# Patient Record
Sex: Female | Born: 1935 | Hispanic: No | Smoking: Former smoker
Health system: Southern US, Community
[De-identification: ages and names within clinical notes are randomized; demographics above are authoritative.]

## PROBLEM LIST (undated history)

## (undated) DIAGNOSIS — A809 Acute poliomyelitis, unspecified: Secondary | ICD-10-CM

## (undated) DIAGNOSIS — R011 Cardiac murmur, unspecified: Secondary | ICD-10-CM

## (undated) DIAGNOSIS — M1990 Unspecified osteoarthritis, unspecified site: Secondary | ICD-10-CM

## (undated) DIAGNOSIS — G5602 Carpal tunnel syndrome, left upper limb: Secondary | ICD-10-CM

## (undated) DIAGNOSIS — I10 Essential (primary) hypertension: Secondary | ICD-10-CM

## (undated) DIAGNOSIS — K851 Biliary acute pancreatitis without necrosis or infection: Secondary | ICD-10-CM

## (undated) HISTORY — PX: CHOLECYSTECTOMY: SHX55

---

## 1997-12-17 ENCOUNTER — Encounter: Admission: RE | Admit: 1997-12-17 | Discharge: 1998-03-17 | Payer: Self-pay | Admitting: Family Medicine

## 2000-12-16 ENCOUNTER — Encounter: Payer: Self-pay | Admitting: Cardiology

## 2000-12-16 ENCOUNTER — Inpatient Hospital Stay (HOSPITAL_COMMUNITY): Admission: EM | Admit: 2000-12-16 | Discharge: 2000-12-19 | Payer: Self-pay | Admitting: *Deleted

## 2000-12-17 ENCOUNTER — Encounter: Payer: Self-pay | Admitting: Cardiology

## 2001-01-09 ENCOUNTER — Ambulatory Visit (HOSPITAL_COMMUNITY): Admission: RE | Admit: 2001-01-09 | Discharge: 2001-01-09 | Payer: Self-pay | Admitting: Family Medicine

## 2001-02-13 ENCOUNTER — Ambulatory Visit (HOSPITAL_COMMUNITY): Admission: RE | Admit: 2001-02-13 | Discharge: 2001-02-13 | Payer: Self-pay | Admitting: Family Medicine

## 2001-02-20 ENCOUNTER — Encounter: Payer: Self-pay | Admitting: Family Medicine

## 2001-02-20 ENCOUNTER — Ambulatory Visit (HOSPITAL_COMMUNITY): Admission: RE | Admit: 2001-02-20 | Discharge: 2001-02-20 | Payer: Self-pay | Admitting: Family Medicine

## 2001-04-22 ENCOUNTER — Emergency Department (HOSPITAL_COMMUNITY): Admission: EM | Admit: 2001-04-22 | Discharge: 2001-04-22 | Payer: Self-pay | Admitting: Emergency Medicine

## 2001-04-22 ENCOUNTER — Encounter: Payer: Self-pay | Admitting: Emergency Medicine

## 2001-07-24 ENCOUNTER — Other Ambulatory Visit: Admission: RE | Admit: 2001-07-24 | Discharge: 2001-07-24 | Payer: Self-pay | Admitting: Family Medicine

## 2002-07-30 ENCOUNTER — Other Ambulatory Visit: Admission: RE | Admit: 2002-07-30 | Discharge: 2002-07-30 | Payer: Self-pay | Admitting: Family Medicine

## 2002-10-10 ENCOUNTER — Encounter: Admission: RE | Admit: 2002-10-10 | Discharge: 2003-01-08 | Payer: Self-pay | Admitting: Family Medicine

## 2003-02-12 ENCOUNTER — Encounter: Admission: RE | Admit: 2003-02-12 | Discharge: 2003-05-13 | Payer: Self-pay | Admitting: Family Medicine

## 2003-08-05 ENCOUNTER — Other Ambulatory Visit: Admission: RE | Admit: 2003-08-05 | Discharge: 2003-08-05 | Payer: Self-pay | Admitting: Family Medicine

## 2004-04-18 ENCOUNTER — Emergency Department (HOSPITAL_COMMUNITY): Admission: EM | Admit: 2004-04-18 | Discharge: 2004-04-18 | Payer: Self-pay | Admitting: Emergency Medicine

## 2004-09-14 ENCOUNTER — Other Ambulatory Visit: Admission: RE | Admit: 2004-09-14 | Discharge: 2004-09-14 | Payer: Self-pay | Admitting: Family Medicine

## 2004-11-02 ENCOUNTER — Ambulatory Visit (HOSPITAL_COMMUNITY): Admission: RE | Admit: 2004-11-02 | Discharge: 2004-11-02 | Payer: Self-pay | Admitting: *Deleted

## 2004-11-02 ENCOUNTER — Encounter (INDEPENDENT_AMBULATORY_CARE_PROVIDER_SITE_OTHER): Payer: Self-pay | Admitting: *Deleted

## 2005-10-04 ENCOUNTER — Other Ambulatory Visit: Admission: RE | Admit: 2005-10-04 | Discharge: 2005-10-04 | Payer: Self-pay | Admitting: Family Medicine

## 2005-10-25 ENCOUNTER — Emergency Department (HOSPITAL_COMMUNITY): Admission: EM | Admit: 2005-10-25 | Discharge: 2005-10-25 | Payer: Self-pay | Admitting: Emergency Medicine

## 2006-10-24 ENCOUNTER — Other Ambulatory Visit: Admission: RE | Admit: 2006-10-24 | Discharge: 2006-10-24 | Payer: Self-pay | Admitting: Family Medicine

## 2008-02-11 DIAGNOSIS — K851 Biliary acute pancreatitis without necrosis or infection: Secondary | ICD-10-CM

## 2008-02-11 HISTORY — DX: Biliary acute pancreatitis without necrosis or infection: K85.10

## 2008-02-19 ENCOUNTER — Ambulatory Visit: Payer: Self-pay | Admitting: Internal Medicine

## 2008-02-19 ENCOUNTER — Inpatient Hospital Stay (HOSPITAL_COMMUNITY): Admission: EM | Admit: 2008-02-19 | Discharge: 2008-02-27 | Payer: Self-pay | Admitting: Emergency Medicine

## 2008-02-25 ENCOUNTER — Encounter (INDEPENDENT_AMBULATORY_CARE_PROVIDER_SITE_OTHER): Payer: Self-pay | Admitting: General Surgery

## 2008-10-31 ENCOUNTER — Encounter: Admission: RE | Admit: 2008-10-31 | Discharge: 2008-12-04 | Payer: Self-pay | Admitting: Family Medicine

## 2010-08-25 NOTE — Consult Note (Signed)
NAME:  LAQUITHA, HESLIN               ACCOUNT NO.:  [id number]   MEDICAL RECORD NO.:  [id number]          PATIENT TYPE:  INP   LOCATION:  1535                         FACILITY:  [facility]   PHYSICIAN:  John C. Madilyn Fireman, M.D.    DATE OF BIRTH:  09-04-1927   DATE OF CONSULTATION:  02/19/2008  DATE OF DISCHARGE:                                 CONSULTATION   We were asked to see Ms. Tallon today in consultation for abdominal  pain and vomiting by Dr. Crista Curb of the [facility] team.   PRIMARY CARE PHYSICIAN:  Chales Salmon. Abigail Miyamoto, M.D.   HISTORY OF PRESENT ILLNESS:  This is a very pleasant 77 year old female  who describes two episodes of vomiting Sunday morning.  Vomiting was  bilious green emesis.  She also describes sharp, continuous abdominal  pain in a band that traveled across her epigastrium.  This pain was not  relieved until she came to the emergency room at [facility]  and received morphine.  Her last morphine dose was at 3:00 a.m. this  morning approximately 14 hours ago.  Currently she is pain free.  This  is after eating two meals of solid foods today.  She has had no more  vomiting.  She did have one episode of loose bowel today.  Normally her  bowel movements are very regular and daily.   PAST MEDICAL HISTORY:  Is significant for:  1. Diabetes mellitus.  2. Hypertension and hyperlipidemia.  3. She has never had a surgery.   She is very good about her health maintenance.  Her last colonoscopy was  in July 2009 with Dr. Charlott Rakes.  He did find some benign polyps  and removed them.   CURRENT MEDICATIONS:  Include lisinopril, Actos, Gliclazide, Cardizem  and simvastatin.  She has no known drug allergies.   REVIEW OF SYSTEMS:  Is significant for some weight gain but no fever, no  palpitations.  No shortness of breath.  No recent illness.   SOCIAL HISTORY:  Is negative for any alcohol.  She used to smoke  cigarettes, has a 25-year tobacco history, quit  smoking cigarettes about  20 years ago.   FAMILY HISTORY:  Is negative for gallbladder disease.  Negative for  colon cancer.  Negative for any pancreatic disease.   PHYSICAL EXAM:  She is alert and oriented, sitting up in the chair.  She  is in no apparent distress.  Her temperature is 97.6, pulse 74,  respirations 18, blood pressure is 130/70.  HEART:  Has a regular rate  and rhythm with no murmurs, rubs or gallops appreciated.  LUNGS:  Clear to auscultation.  ABDOMEN:  Is soft, completely nontender.  She has hypoactive bowel  sounds.  She is nondistended.   LABORATORIES:  Show a hemoglobin of 12.3, hematocrit 38.9, white count  8.0, platelets 318,000.  BMET was significant on admission for a  potassium of 5.3.  That has now been corrected, it is 3.5.  Her BUN and  creatinine are within normal limits.  AST is 507, ALT is 443, alk phos  on  admission was 216,  now is 262, total bilirubin on admission was 2.9,  today is 3.8 more direct than  indirect.  Lipase on admission was 2838,  now is 1050.  Calcium 9.3, cholesterol 116, triglycerides are 43.   RADIOLOGICAL EXAMS:  Include a CT scan of her abdomen, pelvis done  yesterday November 8 that showed a normal pancreas.  Her gallbladder was  collapsed.  There was no biliary dilatation.  Her common bile duct was  4.8 mm in maximum diameter.  It was noted that the gallbladder was  poorly distended with either pericholecystic fluid or low-density wall  thickening.  However, the gallbladder could not be visualized on  ultrasound.   ASSESSMENT:  Dr. Dorena Cookey has seen and examined the patient, collected  a history and reviewed her chart.  His impression is this is a 33-year-  old female in generally good health with a history of diabetes,  hypertension, hyperlipidemia.  She has experienced acute pancreatitis  likely gallstone induced.  We are still concerned about  choledocholithiasis.  However, she could have recently passed a common   bile duct stone.   PLAN:  To check liver function studies in the morning.  If these have  continued to go up, we will schedule an endoscopic retrograde  cholangiopancreatography as an inpatient tomorrow.  If her liver  function studies are sown, may consider an MRCP versus an inpatient  surgery consult versus an outpatient surgery consult with  Gastroenterology followup.   Thanks very much for this consultation.  We will follow this patient  with you.      Stephani Police, PA    ______________________________  Everardo All Madilyn Fireman, M.D.    MLY/MEDQ  D:  02/19/2008  T:  02/19/2008  Job:  [id number]   cc:   Chales Salmon. Abigail Miyamoto, M.D.  Fax: [phone]   Shirley Friar, MD  Fax: [phone]

## 2010-08-25 NOTE — Op Note (Signed)
NAME:  Jenna Murphy, Jenna Murphy               ACCOUNT NO.:  [id number]   MEDICAL RECORD NO.:  [id number]          PATIENT TYPE:  INP   LOCATION:  1535                         [facility]ACILITY:  [facility]   PHYSICIAN:  John C. Madilyn Fireman, M.D.    DATE OF BIRTH:  1928-04-01   DATE OF PROCEDURE:  02/21/2008  DATE OF DISCHARGE:                               OPERATIVE REPORT   PROCEDURE PERFORMED:  Endoscopic retrograde cholangiopancreatography.   INDICATIONS FOR PROCEDURE:  Episode of abdominal pain with  choledocholithiasis suspected with persistently elevated liver function  tests.   PROCEDURE:  The patient was placed in the prone position and placed on  the pulse monitor with continuous low-flow oxygen delivered by nasal  cannula.  She was sedated with a total of 175 mcg IV fentanyl, 20 mg IV  Versed, 12.5 mg of Benadryl and 1 mg IV glucagon.  The Olympus side-  viewing endoscope was advanced blindly into the oropharynx, esophagus,  stomach.  The pylorus was traversed.  The papilla of Vater located on  the medial duodenal wall had somewhat a small appearance and was  somewhat recessed underneath the fold, but appeared to have a fairly  small opening.  Shallow cannulation was achieved several times and a  guidewire.  Both the guidewire and dye injections entered the pancreatic  duct.  I was unsuccessful in entering the common bile duct.  Dr. Melvia Heaps came in and assisted and he likewise was unsuccessful in entering  the common bile duct.  Later in the procedure the patient began having  increasing spasm that was not alleviated by glucagon and increased  sedation and persisted indefinitely until the end of the procedure,  precluding even a view of the papilla.  After approximately one hour  procedure time and not being able to relieve the spasm and adequately  visualize the papilla, we decided to abort the procedure.  The patient  was returned to the recovery room in stable condition.  She tolerated  the  procedure well and there were no immediate complications.   IMPRESSION:  1. Normal pancreatic duct.  2. Unsuccessful attempts to cannulate common bile duct at least partly      and due to persistent spasm and could not be overcome.   PLAN:  I will discuss options with patient to tentatively set up a  repeat procedure tomorrow, done under propofol sedation, to see if this  allows better access.           ______________________________  Everardo All Madilyn Fireman, M.D.     JCH/MEDQ  D:  02/21/2008  T:  02/21/2008  Job:  [id number]

## 2010-08-25 NOTE — Op Note (Signed)
NAME:  Jenna Murphy, Jenna Murphy               ACCOUNT NO.:  [id number]   MEDICAL RECORD NO.:  [id number]          PATIENT TYPE:  INP   LOCATION:  1535                         FACILITY:  [facility]   PHYSICIAN:  Ollen Gross. Vernell Morgans, M.D. DATE OF BIRTH:  22-Mar-1928   DATE OF PROCEDURE:  02/25/2008  DATE OF DISCHARGE:                               OPERATIVE REPORT   PREOPERATIVE DIAGNOSIS:  Gallstone pancreatitis.   POSTOPERATIVE DIAGNOSIS:  Gallstone pancreatitis.   PROCEDURE:  Laparoscopic cholecystectomy with intraoperative  cholangiogram.   SURGEON:  Ollen Gross. Vernell Morgans, M.D.   ASSISTANT:  Evelene Croon, M.D.   ANESTHESIA:  Endotracheal.   PROCEDURE IN DETAIL:  After informed consent was obtained, the patient  was brought to the operating room and placed in supine position on the  table.  After having induction of general anesthesia the patient's  abdomen was prepped with Betadine and draped in the usual sterile  manner.  The area below the umbilicus was infiltrated with 0.25%  Marcaine.  A small incision was made with a 15 blade knife.  This  incision was carried down through the subcutaneous tissue bluntly with a  Kelly clamp and [employer] retractors until the linea alba was  identified.  The linea alba was incised with a 15 blade knife.  The  linea alba was grasped with Kocher clamps and elevated anteriorly.  The  preperitoneal space was then probed bluntly with a hemostat until the  peritoneum was opened and access was gained to the abdominal cavity.  A  zero Vicryl pursestring stitch was placed in the fascia around the  opening.  Hasson cannula was placed through the opening and anchored in  place with the previously placed Vicryl pursestring stitch.  The abdomen  was then insufflated with carbon dioxide without difficulty.  The  patient was placed in reverse Trendelenburg position and rotated with  the right side up.  Next the epigastric region was infiltrated with  0.25% Marcaine.  A small  incision was made with a 15 blade knife.  A 10  mm port was slid in place bluntly through this incision into the  abdominal cavity under direct vision.  Sites were then chosen laterally  on the right side of the abdomen with placement of 5 mm ports.  Each of  these areas was infiltrated with 0.25% Marcaine.  Small stab incisions  were made with a 15 blade knife.  The 5 mm ports were then placed  bluntly through these incisions into the abdominal cavity under direct  vision.  A blunt grasper was placed through the lateral-most 5 mm port  and then used to grasp the dome of the gallbladder and elevate it  anteriorly and superiorly.  Another blunt grasper was placed through the  other 5 mm port and used to retract on the body and neck of the  gallbladder.  There were some filmy omental adhesions to the body of the  gallbladder that were taken down by a combination of dissection with the  electrocautery and then with the laparoscopic scissors.  Once this was  taken down we  were able to see the base of the neck of the gallbladder  well.  The porta hepatis was very inflamed but we were able to identify  the gallbladder neck/cystic duct junction without difficulty.  A good  window was created.  A clip was then placed on the gallbladder neck.  A  small ductotomy was made just below the clip with the laparoscopic  scissors.  A 14 gauge Angiocath was placed percutaneously through the  anterior abdominal wall under direct vision.  A Reddick cholangiogram  catheter was placed through the Angiocath and flushed.  The Reddick  catheter was then placed within the cystic duct and anchored in place  with the clip.  A cholangiogram was obtained that showed no filling  defects and good emptying into the duodenum and adequate length on the  cystic duct.  The anchoring clip and catheters were then removed from  the patient.  Two clips were replaced proximally on the cystic duct and  doubly divided between the  two sets of clips.  Posterior to this the  cystic artery was identified with a branch point.  This was all  dissected bluntly in a circumferential manner until a good window was  created.  Two clips were placed proximally and one distally on each  branch and the vessel was divided between the two.  Next a laparoscopic  hook cautery device was used to separate the gallbladder from the liver  bed.  Prior to completely detaching the gallbladder from the liver bed  the liver bed was inspected and several small bleeding points were  coagulated with electrocautery until the area was completely hemostatic.  The gallbladder was then detached the rest of the way from the liver bed  without difficulty.  The laparoscopic bag was inserted through the  epigastric port.  The gallbladder was placed in the bag and the bag was  sealed.  The abdomen was then irrigated with copious amounts of saline  until the effluent was clear.  The liver bed was inspected again and  found to be hemostatic.  The laparoscope was then moved to the  epigastric port.  The gallbladder grasper was placed through the Hasson  cannula and used to grasp the upper end of the bag.  The bag with the  gallbladder was removed through the infraumbilical port without  difficulty.  The fascial defect was closed with the previously placed  Vicryl pursestring stitch as well as with another figure-of-eight zero  Vicryl stitch.  The rest of the ports were then removed under direct  vision and were found to be hemostatic.  The gas was allowed to escape.  The skin incisions were all closed with interrupted 4-0 Monocryl  subcuticular stitches.  Dermabond dressings were applied.  The patient  tolerated the procedure well.  At the end of the case all needle, sponge  and instrument counts were correct.  The patient was then awakened and  taken to recovery in stable condition.      Ollen Gross. Vernell Morgans, M.D.  Electronically Signed     PST/MEDQ   D:  02/25/2008  T:  02/25/2008  Job:  [id number]

## 2010-08-25 NOTE — H&P (Signed)
NAME:  Jenna Murphy, Jenna Murphy NO.:  [id number]   MEDICAL RECORD NO.:  [id number]          PATIENT TYPE:  EMS   LOCATION:  ED                           FACILITY:  [facility]   PHYSICIAN:  Therisa Doyne, MD    DATE OF BIRTH:  1927/09/29   DATE OF ADMISSION:  02/18/2008  DATE OF DISCHARGE:                              HISTORY & PHYSICAL   PRIMARY CARE PHYSICIAN:  Chales Salmon. Abigail Miyamoto, M.D.   CHIEF COMPLAINT:  Abdominal pain.   HISTORY OF PRESENT ILLNESS:  A 77 year old African female with past  medical history significant for hypertension, hyperlipidemia and  diabetes who presents to the emergency department for evaluation of  acute abdominal pain.  The patient awoke this morning at 8 a.m. with  acute abdominal pain rated as a 10/10 on pain scale, was located in the  epigastric region and was nonradiating.  She has been unable to tolerate  any oral intake today.  She has had two episodes of nonbloody,  nonbilious emesis.  The pain is coming and going throughout the day, but  been getting acutely worse by around 5 p.m., so brought her to the  emergency department for further evaluation.  In the emergency room, she  was noted to have an elevated lipase concerning for pancreatitis.   The patient denies any recent medication changes.  She denies any  alcohol use.  The patient denies chest pain, shortness of breath and  worsening signs or symptoms.   ROS: All systems reviewed and are negative except for those mentioned  above in history of present illness.   PAST MEDICAL HISTORY.:  1. Hypertension.  2. Hyperlipidemia.  3. Diabetes mellitus.   SOCIAL HISTORY:  The patient lives in [place].  She denies tobacco,  alcohol or drugs.   FAMILY HISTORY:  Negative for significant GI illnesses.   MEDICATIONS:  1. Actos 45 mg daily.  2. Glipizide 5 mg daily.  3. Lisinopril 20 mg daily.  4. Cardizem 240 mg daily.  5. Simvastatin 80 mg daily.   ALLERGIES:  NO KNOWN DRUG  ALLERGIES.   PHYSICAL EXAMINATION:  VITAL SIGNS:  Temperature 98.2, blood pressure  182/91, pulse 79, respirations 20, oxygen saturation 98% room air.  GENERAL:  No acute distress.  HEENT:  Normocephalic, atraumatic.  Pupils equal, round and reactive to  light and accommodation.  Extraocular movements intact.  Oropharynx pink  and moist without lesions.  NECK:  Supple, no lymphadenopathy, no jugular venous distention.  No  masses.  CARDIOVASCULAR:  Regular rate and rhythm.  No murmurs, rubs or gallops.  LUNGS:  Clear to auscultation bilaterally.  ABDOMEN:  Positive bowel sounds, soft.  Clear to palpation in the  epigastric region.  No rebound or guarding.  No hepatosplenomegaly.  EXTREMITIES:  No cyanosis, clubbing or edema.  Dorsalis pedis pulses 2+  bilaterally.  SKIN:  No rashes.  BACK:  No CVA tenderness.   LABORATORY STUDIES:  Notable for normal CBC.  CMP is notable for  hyperglycemia at 166.  Hyperkalemia at 5.3, total bilirubin elevated at  2.9, alkaline phos is 216, AST  800, ALT 373, lipase 2838.  CT scan of  the abdomen an acute finding.  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  No ischemic changes were seen.   ASSESSMENT/PLAN:  1. Admit the patient to [facility] hospitalist service.   1. Pancreatitis.  I think it is most likely gallstone pancreatitis      based on history and demographics.  She denies any alcohol use and      the most likely etiology is gallstone pancreatitis.  Certainly, she      is on some medication that has been associated with pancreatitis      such as ACE inhibitors.  However, I doubt that this is the cause.      We will check a right upper quadrant ultrasound to rule out      cholelithiasis or choledocholithiasis, check a fasting lipid      profile to rule out hypertriglyceridemia as a cause for      pancreatitis.  We will start the patient on a liquid diet and      advance as tolerated.  Normal saline at 125  cc/hour pain control      with morphine 2 mg IV q.2 h p.r.n. antiemetics with Phenergan.   1. Diabetes mellitus.  Sliding scale insulin protocol.  Hold oral      medication at this time.   1. Hypertension.  Continue lisinopril.   1. Hyperlipidemia.  Continue simvastatin and check a fasting profile.   1. Food/nutritional.  Normal saline at 150 mL per hour.  Check daily      metabolic panels to monitor hyperkalemia, liquid diet.   1. DVT prophylaxis with Lovenox.      Therisa Doyne, MD  Electronically Signed     SJT/MEDQ  D:  02/19/2008  T:  02/19/2008  Job:  [id number]

## 2010-08-25 NOTE — Discharge Summary (Signed)
NAME:  Jenna Murphy, Jenna Murphy NO.:  [id number]   MEDICAL RECORD NO.:  [id number]          PATIENT TYPE:  INP   LOCATION:  1535                         FACILITY:  [facility]   PHYSICIAN:  Ramiro Harvest, MD    DATE OF BIRTH:  07-20-1927   DATE OF ADMISSION:  02/18/2008  DATE OF DISCHARGE:  02/27/2008                               DISCHARGE SUMMARY   PRIMARY CARE PHYSICIAN:  Dr. Abigail Miyamoto of [facility].   DISCHARGE DIAGNOSES:  1. Gallstone pancreatitis status post lap cholecystectomy.  2. Hypokalemia.  3. Hypertension.  4. Hyperlipidemia.  5. Type 2 diabetes.  6. Anxiety.   DISCHARGE MEDICATIONS:  1. Vicodin 1 to 2 tabs p.o. every 4 hours p.r.n. pain.  2. Actos 45 mg p.o. daily.  3. Glipizide 5 mg p.o. daily.  4. Lisinopril 20 mg p.o. daily.  5. Cardizem 240 mg p.o. daily.  6. Simvastatin 80 mg p.o. daily.   DISPOSITION AND FOLLOWUP:  Patient will be discharged home.  Patient is  to follow up with Dr. Carolynne Edouard in 2 weeks.  Patient is also to follow up  with PCP as scheduled.   CONSULTATIONS DONE:  1. A gastroenterology consult was done.  Patient was seen in      consultation by Dr. Madilyn Fireman on February 19, 2008.  2. Surgical consultation was asked for.  Patient was seen in      consultation by Dr. Chevis Pretty, III, of [facility].   PROCEDURES PERFORMED:  A CT of the abdomen and pelvis was performed on  February 18, 2008, which showed no CT evidence of pancreatitis or other  acute findings.  An abdominal film was done on February 19, 2008, which  showed a nonvisualized call bladder possibly due to overlying bowel gas,  however, there was CT findings suspicious for the possibility of acute  cholecystitis.  If this is a clinical concern, then nuclear medicine  hepatobiliary scan should be recommended.  An ERCP was done on February 21, 2008, which was unsuccessful.  Another ERCP was attempted on  February 22, 2008, which was unsuccessful.  An MRCP was done on  February 22, 2008, that showed significantly reduced diagnostic sensitivity and  specificity due to considerable motion artifact.  Patient was unable to  suspend respiration, experienced claustrophobia, abnormal edema tracking  in the porta hepatis with a small amount of perihepatic ascites, mild  dilatation of the common bile duct for age without a discrete filling  defect in the common bile duct identified.  There was also adjacent mild  dilatation of the dorsal pancreatic duct in the ductal head with minimal  irregularity of the remainder of the dorsal pancreatic duct and  pancreatitis involving particularly the pancreatic head is not readily  excluded.  Common bile duct does appear to be more dilated than was  present on the prior CT of February 18, 2008.  Gallbladder currently does  not demonstrate a thick wall although there were several tiny dependent  filling defects which could represent sludge or tiny gallstones, mild  cardiomegaly, duodenal diverticula.  The laparoscopic cholecystectomy  with intraoperative cholangiogram was actually done on February 25, 2008.   BRIEF ADMISSION HISTORY AND PHYSICAL:  [name] is a 77-year-  old African American female, past medical history of hypertension,  hyperlipidemia, and diabetes who presented to the ED for evaluation for  acute abdominal pain.  Patient had woken up on the morning of admission  at 8 a.m. with acute abdominal pain rated a 10/10 on a pain scale which  was located in the epigastric region and nonradiating.  Patient had been  unable to tolerate any oral intake on the day of admission.  Patient had  had 2 episodes of nonbloody, nonbilious emesis.  Pain was coming and  going throughout the day but had been getting acutely worse by 5 p.m. so  patient came to the ED for further evaluation.  In the ED, she was noted  to have an elevated lipase concerning for pancreatitis.  Patient denied  any recent medication changes.   She denied any alcohol use.  Patient  denied any chest pain, no shortness of breath, and no worsening signs or  symptoms.   PHYSICAL EXAM:  Per admitting physician, temperature 98.2, blood  pressure 182/91, pulse of 79, respiratory rate 20, satting 98% on room  air.  GENERAL:  Patient in no acute distress.  HEENT: Normocephalic, atraumatic.  Pupils equal, round, and reactive to  light and accommodation.  Extraocular movements intact.  Oropharynx was  pink, moist, no lesions.  NECK:  Supple.  No lymphadenopathy.  No JVD.  No masses.  Cardiovascular:  Regular rate and rhythm.  No murmurs, rubs, or gallops.  RESPIRATORY:  Lungs were clear to auscultation bilaterally.  ABDOMEN:  Positive bowel sounds, soft, tender to palpation in the  epigastric region.  No rebound.  No guarding.  No hepatosplenomegaly.  EXTREMITIES:  No clubbing, cyanosis, or edema.  Dorsalis pedis pulses  were 2+ bilaterally.  SKIN:  No rashes.  No CVA tenderness.   ADMISSION LABS:  Normal CBC.  CMET was notable for a glucose level of  166 and a potassium level of 5.3, total bilirubin was elevated at 2.9,  alk phosphatase 216, AST 800, ALT 373, lipase 2838.  CT scan of the  abdomen with no acute findings.  EKG shows sinus rhythm with a 1st-  degree AV block, left anterior fascicular block.  No ischemic changes.   HOSPITAL COURSE:  1. Gallstone pancreatitis.  Patient was admitted for presumed      gallstone pancreatitis.  Patient did not have any history of      alcohol use and it was felt pancreatitis was secondary to      gallstones and these have been attributed to ACE inhibitors as well      but it was felt this was not they exact course.  A right upper      quadrant ultrasound was obtained to rule out cholelithiasis or      choledocholithiasis.  A fasting lipid panel was also obtained which      was negative for hypertriglyceridemia.  Patient was made n.p.o.      initially and then put on a liquid diet and placed  on IV fluids and      pain management with morphine and Phenergan.  A GI consultation was      obtained.  Patient was seen in consultation by the      gastroenterologist, Dr. Madilyn Fireman.  It was felt that patient did indeed  have a gallstone pancreatitis.  It was felt patient would benefit      from ERCP.  ERCP was tried on February 21, 2008, and February 22, 2008, both of which were unsuccessful.  MRCP was also obtained with      results as stated above.  Patient did have an initial decrease in      her liver enzymes, as well as her lipase and amylase which      continued to trend down.  Patient was managed symptomatically.  It      was felt the patient would benefit from a surgical consultation.      Patient was seen in consultation by Dr. Vernell Morgans, of [facility].  Patient was scheduled for a lap cholecystectomy      which was done on February 25, 2008, with no complications.      Patient had some improvement in her abdominal symptoms.  Patient's      diet was advanced slowly.  Patient was able to tolerate p.o.      intakes.  Patient had some improvement in abdominal pain only with      generalized soreness.  Patient was hydrated with IV fluids and who      felt that the patient was in stable and improved condition for      discharge home.  During the hospitalization, patient was maintained      on IV Unasyn as well throughout the hospitalization.  Patient will      be discharged in a stable and improved condition to follow up with      Dr. Carolynne Edouard in 2 weeks post discharge and to follow up with her PCP as      scheduled.  2. Hypokalemia.  Patient during the hospitalization was noted to be      hypokalemic.  Patient's potassium was repleted and as well repleted      prior to discharge.   The rest of the patient's chronic medical issues were stable throughout  the hospitalization and patient will be discharged in stable and  improved condition.  On day of discharge,  vital signs, temperature 98.8,  pulse of 72, blood pressure 154/73, respiratory rate 18, satting 96% on  room air.   DISCHARGE LABS:  Sodium 138, potassium 3.3, chloride 107, bicarb 26, BUN  less than 1, creatinine 0.64, glucose of 123, bilirubin 0.9, alk  phosphatase 116, AST 22, ALT 49, calcium of 8.1, albumin of 2.4, protein  of 6.4.  CBC, white count 8.4, hemoglobin of 9.7, platelets of 269,  hematocrit of 29.6, lipase of 19, amylase of 32.  It was a pleasure  taking care of Ms. Jenna Murphy.      Ramiro Harvest, MD  Electronically Signed     DT/MEDQ  D:  02/27/2008  T:  02/27/2008  Job:  [id number]   cc:   Chales Salmon. Abigail Miyamoto, M.D.  Fax: [phone]   Ollen Gross. Vernell Morgans, M.D.  [address]  [place]  [place] [address]   John C. Madilyn Fireman, M.D.  Fax: [phone]

## 2010-08-25 NOTE — Consult Note (Signed)
NAME:  Jenna, Murphy               ACCOUNT NO.:  [id number]   MEDICAL RECORD NO.:  [id number]          PATIENT TYPE:  INP   LOCATION:  1535                         FACILITY:  [facility]   PHYSICIAN:  Ollen Gross. Vernell Morgans, M.D. DATE OF BIRTH:  01/26/1928   DATE OF CONSULTATION:  02/23/2008  DATE OF DISCHARGE:                                 CONSULTATION   REASON FOR CONSULTATION:  We were asked to see Jenna Murphy in  consultation by Dr. Wandalee Ferdinand to evaluate her for gallstone  pancreatitis.   CHIEF COMPLAINT:  Abdominal pain.   HISTORY:  Jenna Murphy is a 77 year old black female who began having  abdominal pain about a week ago at church.  She had some episodes of  nausea, vomiting and the abdominal pain worsened.  She was brought into  the hospital at which time her workup suggested that she had gallstone  pancreatitis.  Her LFTs have been improving.  She has had 2 attempts at  an ERCP which were unsuccessful.  She did have an MRCP which showed some  stones in the gallbladder, but did not suggest any stones or filling  defects in the common bile duct.  Her amylase and lipase have gone up  today, although she feels a little bit better which  hopefully will be  secondary to the attempts at ERCP.  She otherwise denies any current  nausea, vomiting, fevers, chills, chest pain, shortness of breath or  dysuria.  She has had some mild diarrhea.   PAST MEDICAL HISTORY:  1. Significant for diabetes.  2. Hypertension.  3. Hyperlipidemia.   PAST SURGICAL HISTORY:  None.   MEDICATIONS:  1. Actos.  2. Glipizide.  3. Lisinopril.  4. Cardizem.  5. Simvastatin.   ALLERGIES:  NO KNOWN DRUG ALLERGIES.   SOCIAL HISTORY:  She denies use of alcohol or tobacco products.   FAMILY HISTORY:  Noncontributory.   PHYSICAL EXAMINATION:  VITAL SIGNS:  Stable.  She is afebrile.  GENERAL:  She is a well-developed, well-nourished black female in no  acute distress.  SKIN:  Warm and dry.  No jaundice.  HEENT:  Eyes:  Extraocular muscles are intact.  Pupils are equal, round,  and reactive to light.  Sclerae nonicteric.  LUNGS:  Clear bilaterally with no use of accessory respiratory muscles.  HEART:  Regular rate and rhythm with Impulse in the left chest.  ABDOMEN:  Soft with mild central tenderness, but no guarding or  peritonitis.  No palpable mass or hepatosplenomegaly.  EXTREMITIES:  No cyanosis, clubbing or edema.  Good strength in her arms  and legs.  PSYCHOLOGICALLY:  She is alert and oriented x3 with no evidence of  anxiety or depression.   LABORATORY DATA:  On review of her lab work, her white count has been  normal.  Her liver functions are improving.  Her amylase and lipase are  up today.   ASSESSMENT/PLAN:  This is a 77 year old black female with what appears  to be some gallstone pancreatitis.  Initially, it seemed like everything  was settling down, but her amylase and lipase are  back up today which  hopefully is secondary to the attempts at ERCP.  I would agree at this  point with continued bowel rest.  We will follow her enzymes and  physical exam.  Once her pancreatitis seems to have settled down, then I  would agree with cholecystectomy during this hospitalization.  I have  discussed with her in detail the risks and benefits of the operation to  remove the gallbladder as well as some of the technical aspects.  She  understands and wishes to proceed.  I would also possibly suggest having  a PICC line placed and start her on some TPN for some nutrition since it  still may be several days before we get to surgery.      Ollen Gross. Vernell Morgans, M.D.  Electronically Signed     PST/MEDQ  D:  02/23/2008  T:  02/23/2008  Job:  [id number]   cc:   Ollen Gross. Vernell Morgans, M.D.  [address]  [place]  [place] [address]   Graylin Shiver, M.D.  Fax: [phone]

## 2010-08-28 NOTE — Op Note (Signed)
NAME:  Jenna Murphy, Jenna Murphy               ACCOUNT NO.:  [id number]   MEDICAL RECORD NO.:  [id number]          PATIENT TYPE:  INP   LOCATION:  1535                         [facility]ACILITY[facility]  [facility]   PHYSICIAN:  Graylin Shiver, M.D.   DATE OF BIRTH:  16-Jun-1927   DATE OF PROCEDURE:  04/23/2008  DATE OF DISCHARGE:  02/27/2008                               OPERATIVE REPORT   PROCEDURE:  Attempted endoscopic retrograde cholangiopancreatography.   INDICATIONS:  Cholecystitis, suspected CBD stone.   Informed consent was obtained after explanation of the risks of  bleeding, infection, perforation and pancreatitis.   Anesthesia provided the sedation.   DESCRIPTION OF PROCEDURE:  With the patient lying on the abdomen, the  lateral viewing duodenum scope was inserted into the oropharynx and  passed into the esophagus.  It was advanced down the esophagus, then  into the stomach and into the duodenum.  No obvious lesions were seen.  Multiple attempts to try to cannulate the common bile duct were  unsuccessful despite multiple attempts using guidewire and papillotome.  Finally, the procedure was terminated.  She tolerated the procedure well  without obvious complications.   IMPRESSION:  Unsuccessful ERCP.           ______________________________  Graylin Shiver, M.D.     SFG/MEDQ  D:  04/23/2008  T:  04/23/2008  Job:  [id number]

## 2011-01-12 LAB — COMPREHENSIVE METABOLIC PANEL
ALT: 148 — ABNORMAL HIGH
ALT: 188 — ABNORMAL HIGH
ALT: 49 — ABNORMAL HIGH
ALT: 60 — ABNORMAL HIGH
ALT: 83 — ABNORMAL HIGH
ALT: 99 — ABNORMAL HIGH
AST: 109 — ABNORMAL HIGH
AST: 212 — ABNORMAL HIGH
AST: 22
AST: 26
AST: 35
AST: 800 — ABNORMAL HIGH
Albumin: 2.4 — ABNORMAL LOW
Albumin: 2.4 — ABNORMAL LOW
Albumin: 2.5 — ABNORMAL LOW
Albumin: 2.9 — ABNORMAL LOW
Albumin: 3 — ABNORMAL LOW
Albumin: 3.1 — ABNORMAL LOW
Alkaline Phosphatase: 116
Alkaline Phosphatase: 132 — ABNORMAL HIGH
Alkaline Phosphatase: 149 — ABNORMAL HIGH
Alkaline Phosphatase: 208 — ABNORMAL HIGH
Alkaline Phosphatase: 213 — ABNORMAL HIGH
Alkaline Phosphatase: 216 — ABNORMAL HIGH
BUN: 2 — ABNORMAL LOW
BUN: 4 — ABNORMAL LOW
BUN: 8
CO2: 19
CO2: 21
CO2: 22
CO2: 24
CO2: 24
CO2: 26
CO2: 27
Calcium: 8 — ABNORMAL LOW
Calcium: 8.1 — ABNORMAL LOW
Calcium: 8.6
Chloride: 103
Chloride: 105
Chloride: 107
Chloride: 108
Chloride: 108
Chloride: 109
Chloride: 111
Creatinine, Ser: 0.8
Creatinine, Ser: 0.86
Creatinine, Ser: 0.88
Creatinine, Ser: 0.94
GFR calc Af Amer: >60
GFR calc Af Amer: >60
GFR calc Af Amer: >60
GFR calc Af Amer: >60
GFR calc Af Amer: >60
GFR calc Af Amer: >60
GFR calc non Af Amer: 58 — ABNORMAL LOW
GFR calc non Af Amer: >60
GFR calc non Af Amer: >60
GFR calc non Af Amer: >60
GFR calc non Af Amer: >60
GFR calc non Af Amer: >60
GFR calc non Af Amer: >60
Glucose, Bld: 166 — ABNORMAL HIGH
Glucose, Bld: 166 — ABNORMAL HIGH
Glucose, Bld: 78
Glucose, Bld: 89
Potassium: 3.3 — ABNORMAL LOW
Potassium: 3.3 — ABNORMAL LOW
Potassium: 3.5
Potassium: 3.7
Potassium: 5.3 — ABNORMAL HIGH
Sodium: 136
Sodium: 136
Sodium: 138
Sodium: 139
Total Bilirubin: 0.9
Total Bilirubin: 1
Total Bilirubin: 1.7 — ABNORMAL HIGH
Total Bilirubin: 1.7 — ABNORMAL HIGH
Total Bilirubin: 1.8 — ABNORMAL HIGH
Total Bilirubin: 3.5 — ABNORMAL HIGH
Total Protein: 6.2
Total Protein: 6.3
Total Protein: 6.8

## 2011-01-12 LAB — BASIC METABOLIC PANEL
Calcium: 9
Chloride: 105
GFR calc Af Amer: >60
GFR calc non Af Amer: 58 — ABNORMAL LOW
Glucose, Bld: 143 — ABNORMAL HIGH
Potassium: 3.5
Sodium: 139

## 2011-01-12 LAB — CBC
HCT: 29.6 — ABNORMAL LOW
HCT: 30.6 — ABNORMAL LOW
Hemoglobin: 10.8 — ABNORMAL LOW
Hemoglobin: 10.9 — ABNORMAL LOW
Hemoglobin: 12.3
Hemoglobin: 9.8 — ABNORMAL LOW
MCHC: 32.3
MCHC: 32.5
MCHC: 32.5
MCHC: 32.7
MCV: 80.1
MCV: 80.6
MCV: 80.8
Platelets: 238
Platelets: 239
Platelets: 250
Platelets: 256
Platelets: 269
Platelets: 318
RBC: 3.69 — ABNORMAL LOW
RBC: 3.71 — ABNORMAL LOW
RBC: 3.8 — ABNORMAL LOW
RBC: 4.14
RBC: 4.81
RDW: 15.2
RDW: 15.3
RDW: 15.4
RDW: 15.5
WBC: 10.3
WBC: 11.2 — ABNORMAL HIGH
WBC: 5.7
WBC: 8.4

## 2011-01-12 LAB — DIFFERENTIAL
Basophils Absolute: 0
Basophils Absolute: 0
Basophils Absolute: 0
Basophils Absolute: 0
Basophils Relative: 0
Basophils Relative: 0
Basophils Relative: 0
Basophils Relative: 1
Eosinophils Absolute: 0
Eosinophils Absolute: 0.1
Eosinophils Absolute: 0.1
Eosinophils Absolute: 0.1
Eosinophils Relative: 1
Eosinophils Relative: 2
Lymphocytes Relative: 14
Lymphocytes Relative: 18
Lymphs Abs: 1.4
Lymphs Abs: 2.2
Monocytes Absolute: 0.5
Monocytes Absolute: 0.7
Monocytes Absolute: 0.8
Monocytes Absolute: 0.8
Monocytes Relative: 7
Monocytes Relative: 8
Monocytes Relative: 8
Neutro Abs: 3.7
Neutro Abs: 5.8
Neutro Abs: 8.2 — ABNORMAL HIGH
Neutro Abs: 9.4 — ABNORMAL HIGH
Neutrophils Relative %: 56
Neutrophils Relative %: 73
Neutrophils Relative %: 79 — ABNORMAL HIGH

## 2011-01-12 LAB — GLUCOSE, CAPILLARY
Glucose-Capillary: 100 — ABNORMAL HIGH
Glucose-Capillary: 110 — ABNORMAL HIGH
Glucose-Capillary: 119 — ABNORMAL HIGH
Glucose-Capillary: 123 — ABNORMAL HIGH
Glucose-Capillary: 128 — ABNORMAL HIGH
Glucose-Capillary: 129 — ABNORMAL HIGH
Glucose-Capillary: 134 — ABNORMAL HIGH
Glucose-Capillary: 134 — ABNORMAL HIGH
Glucose-Capillary: 141 — ABNORMAL HIGH
Glucose-Capillary: 156 — ABNORMAL HIGH
Glucose-Capillary: 189 — ABNORMAL HIGH
Glucose-Capillary: 71
Glucose-Capillary: 73
Glucose-Capillary: 89
Glucose-Capillary: 90
Glucose-Capillary: 95
Glucose-Capillary: 97
Glucose-Capillary: 98

## 2011-01-12 LAB — LIPASE, BLOOD
Lipase: 1050 — ABNORMAL HIGH
Lipase: 13
Lipase: 14
Lipase: 19
Lipase: 25
Lipase: 292 — ABNORMAL HIGH

## 2011-01-12 LAB — URINALYSIS, ROUTINE W REFLEX MICROSCOPIC
Bilirubin Urine: NEGATIVE
Glucose, UA: NEGATIVE
Hgb urine dipstick: NEGATIVE
Ketones, ur: NEGATIVE
Urobilinogen, UA: 1
pH: 6.5

## 2011-01-12 LAB — PROTIME-INR
INR: 1.1
Prothrombin Time: 14.8

## 2011-01-12 LAB — HEPATIC FUNCTION PANEL
AST: 507 — ABNORMAL HIGH
Albumin: 3.5
Total Bilirubin: 3.8 — ABNORMAL HIGH

## 2011-01-12 LAB — LIPID PANEL
Cholesterol: 116
HDL: 51
LDL Cholesterol: 56
Total CHOL/HDL Ratio: 2.3
Triglycerides: 43
VLDL: 9

## 2011-01-12 LAB — AMYLASE
Amylase: 104
Amylase: 166 — ABNORMAL HIGH
Amylase: 31
Amylase: 32

## 2011-01-12 LAB — MAGNESIUM: Magnesium: 2.4

## 2011-01-12 LAB — POCT CARDIAC MARKERS
Myoglobin, poc: 121
Troponin i, poc: <0.05

## 2011-01-12 LAB — HEMOGLOBIN A1C: Mean Plasma Glucose: 160

## 2011-01-24 ENCOUNTER — Emergency Department (HOSPITAL_COMMUNITY)
Admission: EM | Admit: 2011-01-24 | Discharge: 2011-01-24 | Disposition: A | Discharge status: Home / Self Care | Payer: Medicare Other | Attending: Emergency Medicine | Admitting: Emergency Medicine

## 2011-01-24 DIAGNOSIS — B354 Tinea corporis: Secondary | ICD-10-CM | POA: Insufficient documentation

## 2011-01-24 DIAGNOSIS — I10 Essential (primary) hypertension: Secondary | ICD-10-CM | POA: Insufficient documentation

## 2011-01-24 DIAGNOSIS — E119 Type 2 diabetes mellitus without complications: Secondary | ICD-10-CM | POA: Insufficient documentation

## 2011-05-21 ENCOUNTER — Observation Stay (HOSPITAL_COMMUNITY)
Admission: EM | Admit: 2011-05-21 | Discharge: 2011-05-22 | Disposition: A | Discharge status: Home / Self Care | Payer: Medicare Other | Attending: Family Medicine | Admitting: Family Medicine

## 2011-05-21 ENCOUNTER — Other Ambulatory Visit: Payer: Self-pay

## 2011-05-21 ENCOUNTER — Encounter (HOSPITAL_COMMUNITY): Payer: Self-pay | Admitting: Emergency Medicine

## 2011-05-21 ENCOUNTER — Emergency Department (HOSPITAL_COMMUNITY): Payer: Medicare Other

## 2011-05-21 DIAGNOSIS — E119 Type 2 diabetes mellitus without complications: Secondary | ICD-10-CM | POA: Insufficient documentation

## 2011-05-21 DIAGNOSIS — R0789 Other chest pain: Principal | ICD-10-CM | POA: Insufficient documentation

## 2011-05-21 DIAGNOSIS — I10 Essential (primary) hypertension: Secondary | ICD-10-CM | POA: Diagnosis present

## 2011-05-21 DIAGNOSIS — R109 Unspecified abdominal pain: Secondary | ICD-10-CM | POA: Insufficient documentation

## 2011-05-21 DIAGNOSIS — Z79899 Other long term (current) drug therapy: Secondary | ICD-10-CM | POA: Insufficient documentation

## 2011-05-21 DIAGNOSIS — R079 Chest pain, unspecified: Secondary | ICD-10-CM | POA: Diagnosis present

## 2011-05-21 DIAGNOSIS — Z8249 Family history of ischemic heart disease and other diseases of the circulatory system: Secondary | ICD-10-CM | POA: Insufficient documentation

## 2011-05-21 HISTORY — DX: Biliary acute pancreatitis without necrosis or infection: K85.10

## 2011-05-21 HISTORY — DX: Essential (primary) hypertension: I10

## 2011-05-21 LAB — COMPREHENSIVE METABOLIC PANEL
AST: 16 U/L (ref 0–37)
Albumin: 3.8 g/dL (ref 3.5–5.2)
Alkaline Phosphatase: 96 U/L (ref 39–117)
Chloride: 102 mEq/L (ref 96–112)
Potassium: 3.7 mEq/L (ref 3.5–5.1)
Sodium: 140 mEq/L (ref 135–145)
Total Bilirubin: 0.2 mg/dL — ABNORMAL LOW (ref 0.3–1.2)

## 2011-05-21 LAB — POCT I-STAT, CHEM 8
BUN: 20 mg/dL (ref 6–23)
Creatinine, Ser: 1 mg/dL (ref 0.50–1.10)
Glucose, Bld: 101 mg/dL — ABNORMAL HIGH (ref 70–99)
Hemoglobin: 13.9 g/dL (ref 12.0–15.0)
TCO2: 28 mmol/L (ref 0–100)

## 2011-05-21 LAB — CARDIAC PANEL(CRET KIN+CKTOT+MB+TROPI)
CK, MB: 2.4 ng/mL (ref 0.3–4.0)
CK, MB: 2.5 ng/mL (ref 0.3–4.0)
CK, MB: 2.2 ng/mL (ref 0.3–4.0)
Relative Index: 2.2 (ref 0.0–2.5)
Relative Index: INVALID (ref 0.0–2.5)
Total CK: 109 U/L (ref 7–177)
Total CK: 90 U/L (ref 7–177)
Troponin I: <0.3 ng/mL (ref <0.30)
Troponin I: <0.3 ng/mL (ref <0.30)
Troponin I: <0.3 ng/mL (ref <0.30)

## 2011-05-21 LAB — GLUCOSE, CAPILLARY: Glucose-Capillary: 197 mg/dL — ABNORMAL HIGH (ref 70–99)

## 2011-05-21 LAB — CBC
Platelets: 294 10*3/uL (ref 150–400)
RDW: 14.5 % (ref 11.5–15.5)
WBC: 10.5 10*3/uL (ref 4.0–10.5)

## 2011-05-21 MED ORDER — DILTIAZEM HCL ER BEADS 240 MG PO CP24
240.0000 mg | ORAL_CAPSULE | Freq: Every day | ORAL | Status: DC
  Administered 2011-05-21 – 2011-05-22 (×2): 240 mg via ORAL
  Filled 2011-05-21: qty 2
  Filled 2011-05-21: qty 1

## 2011-05-21 MED ORDER — SODIUM CHLORIDE 0.9 % IJ SOLN: 3.0000 mL | Freq: Two times a day (BID) | INTRAMUSCULAR | Status: DC

## 2011-05-21 MED ORDER — DOCUSATE SODIUM 100 MG PO CAPS
100.0000 mg | ORAL_CAPSULE | Freq: Two times a day (BID) | ORAL | Status: DC
  Administered 2011-05-21 – 2011-05-22 (×3): 100 mg via ORAL
  Filled 2011-05-21 (×4): qty 1

## 2011-05-21 MED ORDER — ACETAMINOPHEN 650 MG RE SUPP: 650.0000 mg | Freq: Four times a day (QID) | RECTAL | Status: DC | PRN

## 2011-05-21 MED ORDER — GLIPIZIDE 5 MG PO TABS
5.0000 mg | ORAL_TABLET | Freq: Two times a day (BID) | ORAL | Status: DC
  Administered 2011-05-21 – 2011-05-22 (×3): 5 mg via ORAL
  Filled 2011-05-21 (×5): qty 1

## 2011-05-21 MED ORDER — GI COCKTAIL ~~LOC~~
30.0000 mL | Freq: Three times a day (TID) | ORAL | Status: DC | PRN
  Filled 2011-05-21: qty 30

## 2011-05-21 MED ORDER — DILTIAZEM HCL ER BEADS 120 MG PO CP24
120.0000 mg | ORAL_CAPSULE | Freq: Every day | ORAL | Status: DC
  Administered 2011-05-21: 120 mg via ORAL
  Filled 2011-05-21 (×3): qty 1

## 2011-05-21 MED ORDER — PANTOPRAZOLE SODIUM 40 MG PO TBEC
40.0000 mg | DELAYED_RELEASE_TABLET | Freq: Every day | ORAL | Status: DC
  Administered 2011-05-21 – 2011-05-22 (×2): 40 mg via ORAL
  Filled 2011-05-21 (×3): qty 1

## 2011-05-21 MED ORDER — ACETAMINOPHEN 325 MG PO TABS: 650.0000 mg | ORAL_TABLET | Freq: Four times a day (QID) | ORAL | Status: DC | PRN

## 2011-05-21 MED ORDER — ONDANSETRON HCL 4 MG/2ML IJ SOLN: 4.0000 mg | Freq: Four times a day (QID) | INTRAMUSCULAR | Status: DC | PRN

## 2011-05-21 MED ORDER — SENNA 8.6 MG PO TABS
1.0000 | ORAL_TABLET | Freq: Two times a day (BID) | ORAL | Status: DC
  Administered 2011-05-21 (×2): 8.6 mg via ORAL
  Filled 2011-05-21 (×2): qty 1

## 2011-05-21 MED ORDER — ASPIRIN 325 MG PO TABS
325.0000 mg | ORAL_TABLET | Freq: Once | ORAL | Status: AC
  Administered 2011-05-21: 325 mg via ORAL
  Filled 2011-05-21: qty 1

## 2011-05-21 MED ORDER — SODIUM CHLORIDE 0.9 % IV SOLN: 250.0000 mL | INTRAVENOUS | Status: DC | PRN

## 2011-05-21 MED ORDER — NITROGLYCERIN 2 % TD OINT
TOPICAL_OINTMENT | TRANSDERMAL | Status: AC
  Administered 2011-05-21: 1 [in_us] via TOPICAL
  Filled 2011-05-21: qty 30

## 2011-05-21 MED ORDER — SODIUM CHLORIDE 0.9 % IJ SOLN
3.0000 mL | INTRAMUSCULAR | Status: DC | PRN
  Administered 2011-05-21: 3 mL via INTRAVENOUS

## 2011-05-21 MED ORDER — ASPIRIN EC 325 MG PO TBEC
325.0000 mg | DELAYED_RELEASE_TABLET | Freq: Every day | ORAL | Status: DC
  Administered 2011-05-21 – 2011-05-22 (×2): 325 mg via ORAL
  Filled 2011-05-21 (×2): qty 1

## 2011-05-21 MED ORDER — LISINOPRIL 20 MG PO TABS
20.0000 mg | ORAL_TABLET | Freq: Every day | ORAL | Status: DC
  Administered 2011-05-21 – 2011-05-22 (×2): 20 mg via ORAL
  Filled 2011-05-21 (×2): qty 1

## 2011-05-21 MED ORDER — SIMVASTATIN 40 MG PO TABS
40.0000 mg | ORAL_TABLET | Freq: Every day | ORAL | Status: DC
  Administered 2011-05-21 – 2011-05-22 (×2): 40 mg via ORAL
  Filled 2011-05-21 (×2): qty 1

## 2011-05-21 MED ORDER — NITROGLYCERIN 0.4 MG SL SUBL
0.4000 mg | SUBLINGUAL_TABLET | SUBLINGUAL | Status: DC | PRN
  Filled 2011-05-21: qty 25

## 2011-05-21 MED ORDER — ONDANSETRON HCL 4 MG PO TABS: 4.0000 mg | ORAL_TABLET | Freq: Four times a day (QID) | ORAL | Status: DC | PRN

## 2011-05-21 MED ORDER — NITROGLYCERIN 2 % TD OINT
1.0000 [in_us] | TOPICAL_OINTMENT | Freq: Once | TRANSDERMAL | Status: AC
  Administered 2011-05-21: 1 [in_us] via TOPICAL

## 2011-05-21 MED ORDER — SODIUM CHLORIDE 0.9 % IJ SOLN
3.0000 mL | Freq: Two times a day (BID) | INTRAMUSCULAR | Status: DC
  Administered 2011-05-22: 3 mL via INTRAVENOUS

## 2011-05-21 MED ORDER — GI COCKTAIL ~~LOC~~
30.0000 mL | Freq: Once | ORAL | Status: AC
  Administered 2011-05-21: 30 mL via ORAL
  Filled 2011-05-21: qty 30

## 2011-05-21 NOTE — ED Notes (Signed)
Sleeping on carrier---awakens easily with verbal  B/P 132/73, HR 63 Sinus, R 18  Pulse Ox 96% on 2 liters

## 2011-05-21 NOTE — ED Notes (Signed)
Pt alert, nad, c/o gen chest pressure, onset this evening, states pain non radiating, denies n/v, denies sob, ambulates to triage

## 2011-05-21 NOTE — Progress Notes (Addendum)
5:09 PM I agree with HPI/GPe and A/P per Dr. Angus Palms Patient has no current chest pain but did have some twinges of pain earlier. Just transferred upstairs to the telemetry floor States that the pain in her chest sort of a burning-like pain. No shortness of breath no vomiting no diarrhea      HEENT arcus no JVD no icterus no pallor CHEST clinically clear and no added sound CARDIAC S1-S2 no murmur rub or gallop, regular rate and rhythm ABDOMEN soft nontender nondistended NEURO cranial nerves II through XII grossly intact SKIN/MUSCULAR no lower extremity swelling  Patient Active Problem List  Diagnoses  . Hypertension-uncontrolled as missed some of her dosages of medication. Will restart medications and reassess     diabetes-usually takes glipizide we'll continue the same at present time     hyperlipidemia- can continue statin   . Chest pain-likely cardiogenic. 2/3 enzymes negative. Continue aspirin for now. Will review as situation evolves . Added Protonix to her medications and to see if this helps.    Pleas Koch, MD [facility] [phone]

## 2011-05-21 NOTE — ED Provider Notes (Signed)
History     CSN: [id number]  Arrival date & time 05/21/11  0107   First MD Initiated Contact with Patient 05/21/11 0121      Chief Complaint  Patient presents with  . Chest Pain    (Consider location/radiation/quality/duration/timing/severity/associated sxs/prior treatment) Patient is a 78 y.o. female presenting with chest pain. The history is provided by the patient.  Chest Pain Pertinent negatives for primary symptoms include no fever, no shortness of breath and no abdominal pain.    onset at home tonight while at rest. Location is across her chest. Pain described as pressure and heaviness. She one episode of pain that lasted about 10-15 minutes and resided. She is going to lay down and developed chest pain again now presents emergency department. She denies any shortness of breath. She denies any nausea or vomiting. She denies any heartburn. Moderate in severity. Patient is very anxious this may be related to her heart but has no history of coronary artery disease. She states she's had a stress test in the past was reportedly normal. She is treated for high blood pressure, diabetes and hyperlipidemia. She is nonsmoker. She has mild discomfort at this time no radiation to arms, jaw or back. No leg pain or swelling. No recent illness. No cough cold or congestion.  Past Medical History  Diagnosis Date  . Diabetes mellitus   . Hypertension     History reviewed. No pertinent past surgical history.  No family history on file.  History  Substance Use Topics  . Smoking status: Never Smoker   . Smokeless tobacco: Not on file  . Alcohol Use: No    OB History    Grav Para Term Preterm Abortions TAB SAB Ect Mult Living                  Review of Systems  Constitutional: Negative for fever and chills.  HENT: Negative for neck pain and neck stiffness.   Eyes: Negative for pain.  Respiratory: Negative for shortness of breath.   Cardiovascular: Positive for chest pain.    Gastrointestinal: Negative for abdominal pain.  Genitourinary: Negative for dysuria.  Musculoskeletal: Negative for back pain.  Skin: Negative for rash.  Neurological: Negative for headaches.  All other systems reviewed and are negative.    Allergies  Review of patient's allergies indicates no known allergies.  Home Medications   Current Outpatient Rx  Name Route Sig Dispense Refill  . ATORVASTATIN CALCIUM 20 MG PO TABS Oral Take 20 mg by mouth daily.    Marland Kitchen VITAMIN D 1000 UNITS PO TABS Oral Take 1,000 Units by mouth daily.    Marland Kitchen COENZYME Q10 30 MG PO CAPS Oral Take 30 mg by mouth daily.    Marland Kitchen DILTIAZEM HCL ER BEADS 120 MG PO CP24 Oral Take 120-240 mg by mouth daily. 240mg  in the morning and 120 mg at night    . OMEGA-3 FATTY ACIDS 1000 MG PO CAPS Oral Take 2 g by mouth daily.    Marland Kitchen GLIPIZIDE 5 MG PO TABS Oral Take 5 mg by mouth 2 (two) times daily before a meal.    . LISINOPRIL 20 MG PO TABS Oral Take 20 mg by mouth daily.      BP 162/75  Pulse 58  Temp(Src) 98 F (36.7 C) (Oral)  Resp 17  Wt 173 lb (78.472 kg)  SpO2 97%  Physical Exam  Constitutional: She is oriented to person, place, and time. She appears well-developed and well-nourished.  HENT:  Head: Normocephalic and atraumatic.  Eyes: Conjunctivae and EOM are normal. Pupils are equal, round, and reactive to light.  Neck: Trachea normal. Neck supple. No thyromegaly present.  Cardiovascular: Normal rate, regular rhythm, S1 normal, S2 normal and normal pulses.     No systolic murmur is present   No diastolic murmur is present  Pulses:      Radial pulses are 2+ on the right side, and 2+ on the left side.  Pulmonary/Chest: Effort normal and breath sounds normal. She has no wheezes. She has no rhonchi. She has no rales. She exhibits no tenderness.  Abdominal: Soft. Normal appearance and bowel sounds are normal. There is no tenderness. There is no CVA tenderness and negative Murphy's sign.  Musculoskeletal:       BLE:s  Calves nontender, no cords or erythema, negative Homans sign  Neurological: She is alert and oriented to person, place, and time. She has normal strength. No cranial nerve deficit or sensory deficit. GCS eye subscore is 4. GCS verbal subscore is 5. GCS motor subscore is 6.  Skin: Skin is warm and dry. No rash noted. She is not diaphoretic.  Psychiatric: Her speech is normal.       Cooperative and appropriate    ED Course  Procedures (including critical care time)  Labs Reviewed  CBC - Abnormal; Notable for the following:    MCH 25.8 (*)    All other components within normal limits  COMPREHENSIVE METABOLIC PANEL - Abnormal; Notable for the following:    Total Bilirubin 0.2 (*)    GFR calc non Af Amer 55 (*)    GFR calc Af Amer 64 (*)    All other components within normal limits  POCT I-STAT, CHEM 8 - Abnormal; Notable for the following:    Glucose, Bld 101 (*)    All other components within normal limits  CARDIAC PANEL(CRET KIN+CKTOT+MB+TROPI)  POCT I-STAT TROPONIN I   Dg Chest 1 View  05/21/2011  *RADIOLOGY REPORT*  Clinical Data: Chest pain  CHEST - 1 VIEW  Comparison: None.  Findings: Cardiac size upper normal limits to mildly enlarged. Mild bronchitic changes without focal consolidation, pleural effusion, pneumothorax. Tortuous aorta with scattered atherosclerosis.  Bilateral shoulder degenerative changes. No acute osseous abnormality.  IMPRESSION: Heart size upper normal limits to mildly enlarged.  No acute process identified.  Original Report Authenticated By: Waneta Martins, M.D.    Date: 05/21/2011  Rate: 90  Rhythm: normal sinus rhythm  QRS Axis: normal  Intervals: normal  ST/T Wave abnormalities: nonspecific ST changes  Conduction Disutrbances:none  Narrative Interpretation:   Old EKG Reviewed: none available    1. Chest pain       MDM   Chest pain with risk factors for ACS. Aspirin provided. EKG reviewed no acute ischemia. Labs and chest x-ray obtained and  reviewed as above. Pain improved with nitroglycerin. Medicine consult obtained. Case discussed as above with Dr. Kaylyn Layer who agrees to evaluation and admission for further evaluation.        Sunnie Nielsen, MD 05/21/11 [phone]

## 2011-05-21 NOTE — H&P (Signed)
PCP:   Alva Garnet., MD, MD  Confirmed with pt  Chief Complaint:  Burning abdominal pain radiating to chest   HPI: 83yoF with h/o DM/HTN/HL presents with abdominal pain radiating to chest.   Pt has no cardiac history and is still very active, goes to the gym and exercises without angina or SOB. Starting 930p tonight, she developed burning and pressure in her mid abdomen and moves up towards the chest, around the mid sternum. Happened while sitting on the cough, talking on the phone. Lasted 10-15 minutes, then go away, then came back around 3 times. No radiation elsewhere, no associated symptoms of dizziness, cold / clamminess, diaphoresis, SOB, but she was belching a lot. Doesn't have acid reflux problems commonly, and hasn't eaten anything different recently. Had this before, 5-6 months ago while in church, and it stayed awhile and lasted 15-20 mins then went away.   Doesn't have chronic angina. Could walk up a flight of stairs, without angina but would get  SOB. Has otherwise been in good health and has no other major complaints, doesn't have  fevers, chills, sweats, weight loss, GI symptoms of n/v/d/abd pain.   In the ED vitals with tachycardia up to 110, HTN up to 204/119, but not now improved. Labs  with normal chemistry panel, negative cardiac enzymes, normal CBC. Trop negative POC. CXR  with borderline cardiomegaly, no acute process. Pt was given 325 ASA, nitro paste, and GI cocktail. Of note, nursing told me that pain was immediately relieved when GI cocktail was given.   Pt currently not in any pain. ROS as above, o/w very negative. Pt was in good health before this.    Past Medical History  Diagnosis Date  . Diabetes mellitus     not on insulin  . Hypertension   . Gallstone pancreatitis 02/2008   History reviewed. No pertinent past surgical history.  Medications:  HOME MEDS: Reconciled with pt  Prior to Admission medications   Medication Sig Start Date End Date  Taking? Authorizing Provider  atorvastatin (LIPITOR) 20 MG tablet Take 20 mg by mouth daily.   Yes Historical Provider, MD  cholecalciferol (VITAMIN D) 1000 UNITS tablet Take 1,000 Units by mouth daily.   Yes Historical Provider, MD  co-enzyme Q-10 30 MG capsule Take 30 mg by mouth daily.   Yes Historical Provider, MD  diltiazem (TIAZAC) 120 MG 24 hr capsule Take 120-240 mg by mouth daily. 240mg  in the morning and 120 mg at night   Yes Historical Provider, MD  fish oil-omega-3 fatty acids 1000 MG capsule Take 2 g by mouth daily.   Yes Historical Provider, MD  glipiZIDE (GLUCOTROL) 5 MG tablet Take 5 mg by mouth 2 (two) times daily before a meal.   Yes Historical Provider, MD  lisinopril (PRINIVIL,ZESTRIL) 20 MG tablet Take 20 mg by mouth daily.   Yes Historical Provider, MD   Allergies:  No Known Allergies  Social History:   reports that she quit smoking about 43 years ago. Her smoking use included Cigarettes. She has a 20 pack-year smoking history. She has never used smokeless tobacco. She reports that she does not drink alcohol or use illicit drugs.  Lives at home by self, no cane or walker, goes to the gym twice a week and does yoga, works out. Has three sons and two daughters. Karren Burly son [phone].  Family History: Family History  Problem Relation Age of Onset  . Heart attack Mother     Died of MI,  dec at 83   . Cirrhosis Father     alcohol induced    Physical Exam: Filed Vitals:   05/21/11 0300 05/21/11 0315 05/21/11 0330 05/21/11 0350  BP: 162/75 201/110 165/90 151/78  Pulse: 58 77 80 73  Temp:    98.7 F (37.1 C)  TempSrc:    Oral  Resp:  17 22 14   Weight:      SpO2: 97% 100% 98% 100%   Blood pressure 151/78, pulse 73, temperature 98.7 F (37.1 C), temperature source Oral, resp. rate 14, weight 78.472 kg (173 lb), SpO2 100.00%.  Gen: Younger than stated age F in no distress, comfortable now, able to relate history well,  pleasant, well appearing HEENT: Pupils round,  reactive, EOMI, sclera clear, normal. Mouth moist, normal appearing Lungs: CTAB no w/c/r, good air movement, overall normal Heart: Regular, not tachycardic, no m/g, no adventitious sounds, S1/2 clear, normal exam Abd: Overweight, with minimal abd distention, but no TTP, no facial grimacing, overall benign  exam  Extrem: Warm, well perfused, radials palpable easily, normal exam. No BLE edema.  Neuro: Alert, attentive, CN 2-12 intact, moves extremities well, grossly non-focal.    Labs & Imaging Results for orders placed during the hospital encounter of 05/21/11 (from the past 48 hour(s))  CARDIAC PANEL(CRET KIN+CKTOT+MB+TROPI)     Status: Normal   Collection Time   05/21/11  1:33 AM      Component Value Range Comment   Total CK 109  7 - 177 (U/L)    CK, MB 2.4  0.3 - 4.0 (ng/mL)    Troponin I <0.30  <0.30 (ng/mL)    Relative Index 2.2  0.0 - 2.5    CBC     Status: Abnormal   Collection Time   05/21/11  1:33 AM      Component Value Range Comment   WBC 10.5  4.0 - 10.5 (K/uL)    RBC 4.85  3.87 - 5.11 (MIL/uL)    Hemoglobin 12.5  12.0 - 15.0 (g/dL)    HCT 81.1  91.4 - 78.2 (%)    MCV 78.8  78.0 - 100.0 (fL)    MCH 25.8 (*) 26.0 - 34.0 (pg)    MCHC 32.7  30.0 - 36.0 (g/dL)    RDW 95.6  21.3 - 08.6 (%)    Platelets 294  150 - 400 (K/uL)   COMPREHENSIVE METABOLIC PANEL     Status: Abnormal   Collection Time   05/21/11  1:33 AM      Component Value Range Comment   Sodium 140  135 - 145 (mEq/L)    Potassium 3.7  3.5 - 5.1 (mEq/L)    Chloride 102  96 - 112 (mEq/L)    CO2 28  19 - 32 (mEq/L)    Glucose, Bld 99  70 - 99 (mg/dL)    BUN 19  6 - 23 (mg/dL)    Creatinine, Ser 5.78  0.50 - 1.10 (mg/dL)    Calcium 9.8  8.4 - 10.5 (mg/dL)    Total Protein 8.3  6.0 - 8.3 (g/dL)    Albumin 3.8  3.5 - 5.2 (g/dL)    AST 16  0 - 37 (U/L)    ALT 16  0 - 35 (U/L)    Alkaline Phosphatase 96  39 - 117 (U/L)    Total Bilirubin 0.2 (*) 0.3 - 1.2 (mg/dL)    GFR calc non Af Amer 55 (*) >90 (mL/min)    GFR  calc Af Denyse Dago  64 (*) >90 (mL/min)   POCT I-STAT, CHEM 8     Status: Abnormal   Collection Time   05/21/11  1:39 AM      Component Value Range Comment   Sodium 143  135 - 145 (mEq/L)    Potassium 3.8  3.5 - 5.1 (mEq/L)    Chloride 105  96 - 112 (mEq/L)    BUN 20  6 - 23 (mg/dL)    Creatinine, Ser 1.61  0.50 - 1.10 (mg/dL)    Glucose, Bld 096 (*) 70 - 99 (mg/dL)    Calcium, Ion 0.45  1.12 - 1.32 (mmol/L)    TCO2 28  0 - 100 (mmol/L)    Hemoglobin 13.9  12.0 - 15.0 (g/dL)    HCT 40.9  81.1 - 91.4 (%)   POCT I-STAT TROPONIN I     Status: Normal   Collection Time   05/21/11  1:39 AM      Component Value Range Comment   Troponin i, poc 0.00  0.00 - 0.08 (ng/mL)    Comment 3             Dg Chest 1 View  05/21/2011  *RADIOLOGY REPORT*  Clinical Data: Chest pain  CHEST - 1 VIEW  Comparison: None.  Findings: Cardiac size upper normal limits to mildly enlarged. Mild bronchitic changes without focal consolidation, pleural effusion, pneumothorax. Tortuous aorta with scattered atherosclerosis.  Bilateral shoulder degenerative changes. No acute osseous abnormality.  IMPRESSION: Heart size upper normal limits to mildly enlarged.  No acute process identified.  Original Report Authenticated By: Waneta Martins, M.D.   ECG: NSR with frequent PAC's coupled to sinus beats. LAD with LAFB. Possible Q waves in V1-2.  Difficult baseline, but possible sub-31mm to 1mm upwardly concave STE in V1-2 as well. TW's all appropriate.   Impression Present on Admission:  .Chest pain .Hypertension  83yoF with h/o DM/HTN/HL presents with abdominal pain radiating to chest.   1. Abdominal pain, chest pain: Pt with risk factors of DM/HTN, and moderately concerning story, however 1st set of enzymes negative, and ECG not specficially ischemic. Spoke to cardiology overnight and the STE in V1-2 is probably just an IVCD and not STEMI. Frankly overall this seems like GI source.   - ASA 325 mg daily until rules out. GI cocktails  PRN - Trend enzymes and EKG  2. HTN: Continue home meds, give Labetalol PRN severe HTN.   Telemetry bed, [facility] team 4 Presumed full, not able to discuss   Other plans as per orders.  Kyi Romanello 05/21/2011, 5:08 AM

## 2011-05-22 DIAGNOSIS — R079 Chest pain, unspecified: Secondary | ICD-10-CM | POA: Diagnosis present

## 2011-05-22 DIAGNOSIS — I10 Essential (primary) hypertension: Secondary | ICD-10-CM | POA: Diagnosis present

## 2011-05-22 MED ORDER — DSS 100 MG PO CAPS: 100.0000 mg | ORAL_CAPSULE | Freq: Two times a day (BID) | ORAL | Status: AC

## 2011-05-22 MED ORDER — PANTOPRAZOLE SODIUM 40 MG PO TBEC: 40.0000 mg | DELAYED_RELEASE_TABLET | Freq: Every day | ORAL | Status: DC

## 2011-05-22 NOTE — Discharge Summary (Signed)
Physician Discharge Summary  Patient ID: Jenna Murphy MRN: [id number] DOB/AGE: May 27, 1927 78 y.o.  Admit date: 05/21/2011 Discharge date: 05/22/2011  Primary Care Physician:  Alva Garnet., MD, MD   Discharge Diagnoses:    Present on Admission:  .Chest pain .Hypertension .Chest pain .Hypertension  Medication List  As of 05/22/2011 11:10 AM   TAKE these medications         atorvastatin 20 MG tablet   Commonly known as: LIPITOR   Take 20 mg by mouth daily.      cholecalciferol 1000 UNITS tablet   Commonly known as: VITAMIN D   Take 1,000 Units by mouth daily.      co-enzyme Q-10 30 MG capsule   Take 30 mg by mouth daily.      diltiazem 120 MG 24 hr capsule   Commonly known as: TIAZAC   Take 120-240 mg by mouth daily. 240mg  in the morning and 120 mg at night      DSS 100 MG Caps   Take 100 mg by mouth 2 (two) times daily.      fish oil-omega-3 fatty acids 1000 MG capsule   Take 2 g by mouth daily.      glipiZIDE 5 MG tablet   Commonly known as: GLUCOTROL   Take 5 mg by mouth 2 (two) times daily before a meal.      lisinopril 20 MG tablet   Commonly known as: PRINIVIL,ZESTRIL   Take 20 mg by mouth daily.      pantoprazole 40 MG tablet   Commonly known as: PROTONIX   Take 1 tablet (40 mg total) by mouth daily at 12 noon.             Disposition and Follow-up: Patient medically stable and ready for discharge to home. Will follow up with PCP appointment 06/04/11  Consults:  None   Physical exam: Gen: Younger than stated age F in no distress, sitting up in chair talking on phone,  pleasant, well appearing  HEENT: Pupils round, reactive, EOMI, sclera clear, normal. Mouth moist, normal appearing  Lungs: Normal effort. Breath sounds CTAB no w/c/r, good air movement  Heart: Regular rate and rhythm, no murmur, gallap rub  Abd: Nondistended, soft, non-tender. Positive bowel sounds Extrem: Warm, well perfused, radials palpable easily, normal exam. No BLE  edema.  Neuro: Alert, attentive, CN 2-12 intact, moves extremities well, grossly non-focal    Significant Diagnostic Studies:  Dg Chest 1 View  05/21/2011  *RADIOLOGY REPORT*  Clinical Data: Chest pain  CHEST - 1 VIEW  Comparison: None.  Findings: Cardiac size upper normal limits to mildly enlarged. Mild bronchitic changes without focal consolidation, pleural effusion, pneumothorax. Tortuous aorta with scattered atherosclerosis.  Bilateral shoulder degenerative changes. No acute osseous abnormality.  IMPRESSION: Heart size upper normal limits to mildly enlarged.  No acute process identified.  Original Report Authenticated By: Waneta Martins, M.D.    Labs Reviewed  CBC - Abnormal; Notable for the following:    MCH 25.8 (*)    All other components within normal limits  COMPREHENSIVE METABOLIC PANEL - Abnormal; Notable for the following:    Total Bilirubin 0.2 (*)    GFR calc non Af Amer 55 (*)    GFR calc Af Amer 64 (*)    All other components within normal limits  POCT I-STAT, CHEM 8 - Abnormal; Notable for the following:    Glucose, Bld 101 (*)    All other components within normal limits  GLUCOSE,  CAPILLARY - Abnormal; Notable for the following:    Glucose-Capillary 197 (*)    All other components within normal limits  CARDIAC PANEL(CRET KIN+CKTOT+MB+TROPI)  POCT I-STAT TROPONIN I  CARDIAC PANEL(CRET KIN+CKTOT+MB+TROPI)  CARDIAC PANEL(CRET KIN+CKTOT+MB+TROPI)  CARDIAC PANEL(CRET KIN+CKTOT+MB+TROPI)        Dg Chest 1 View  05/21/2011  *RADIOLOGY REPORT*  Clinical Data: Chest pain  CHEST - 1 VIEW  Comparison: None.  Findings: Cardiac size upper normal limits to mildly enlarged. Mild bronchitic changes without focal consolidation, pleural effusion, pneumothorax. Tortuous aorta with scattered atherosclerosis.  Bilateral shoulder degenerative changes. No acute osseous abnormality.  IMPRESSION: Heart size upper normal limits to mildly enlarged.  No acute process identified.  Original  Report Authenticated By: Waneta Martins, M.D.       Brief H and P: For complete details please refer to admission H and P, but in brief  83yoF with h/o DM/HTN/HL presents with abdominal pain radiating to chest on 05/21/11.  Pt has no cardiac history and is still very active, goes to the gym and exercises without angina or SOB. Starting 930p on 05/21/11, she developed burning and pressure in her mid abdomen and moves up towards the chest, around the mid sternum. Happened while sitting on the cough, talking on the phone. Lasted 10-15 minutes, then go away, then came back around 3 times. No radiation elsewhere, no associated symptoms of dizziness, cold / clamminess, diaphoresis, SOB, but she was belching a lot. Doesn't have history acid reflux problems commonly, and hasn't eaten anything different recently. Had this before, 5-6 months ago while in church, and it stayed awhile and lasted 15-20 mins then went away.  Doesn't have chronic angina. Could walk up a flight of stairs, without angina but would get  SOB. Has otherwise been in good health and has no other major complaints, doesn't have  fevers, chills, sweats, weight loss, GI symptoms of n/v/d/abd pain.  In the ED vitals with tachycardia up to 110, HTN up to 204/119, but not improved by the time admitting MD examined. Labs with normal chemistry panel, negative cardiac enzymes, normal CBC. Trop negative POC. CXR  with borderline cardiomegaly, no acute process. Pt was given 325 ASA, nitro paste, and GI cocktail. Of note, nursing told me that pain was immediately relieved when GI cocktail was given. Pt admitted to telemetry floor.       Hospital Course:  Active Hospital Problems  Diagnoses Date Noted   . Chest pain 05/22/2011   . Hypertension 05/22/2011     Resolved Hospital Problems  Diagnoses Date Noted Date Resolved  . Chest pain 05/21/2011 05/22/2011  . Hypertension  05/22/2011    Active Problems:  Chest pain  Hypertension 1,  chest pain:  Atypical. Likely GI related. Pt admitted to tele as she has risk factors of DM/HTN.  Cardiac  enzymes negative to date. ECG  With SR and occ PAC and occ PVC. One episode of 3 beat run [place]. The admitting MD spoke to cardiology overnight and the STE in V1-2 is probably just an IVCD and not STEMI. Overall this seems like GI source. Pt experienced no  further chest pain. Pt tolerated diet without CP/abdominal pain. Will discharge with PPI. Pt has appointment with PCP 06/04/11. Can follow up on need for any further cardiac work up   2. HTN:  Uncontrolled at admission. Home meds restarted on admission and BP better controlled.  At time of discharge more controlled. SBP range 120-163. DBP range 56-91. Will discharge  with home meds as before admission. Pt has follow up appointment with PCP 06/04/11. Will need to evaluate BP control.  3. DM type 2. Fair control with diet and oral agents. Will follow up on OP basis with PCP.      Time spent on Discharge: 40 minutes.  SignedGwenyth Bender 05/22/2011, 11:10 AM

## 2011-05-31 NOTE — Discharge Summary (Signed)
I agree with the note as per above including the GPE and A/P Pleas Koch, MD [facility] [phone]

## 2012-01-26 ENCOUNTER — Other Ambulatory Visit: Payer: Self-pay | Admitting: Gastroenterology

## 2012-03-16 ENCOUNTER — Ambulatory Visit (INDEPENDENT_AMBULATORY_CARE_PROVIDER_SITE_OTHER): Payer: BLUE CROSS/BLUE SHIELD | Admitting: Family Medicine

## 2012-03-16 VITALS — BP 175/76 | HR 76 | Temp 98.0°F | Resp 16

## 2012-03-16 DIAGNOSIS — Z23 Encounter for immunization: Secondary | ICD-10-CM

## 2012-08-06 ENCOUNTER — Encounter (HOSPITAL_COMMUNITY): Payer: Self-pay | Admitting: Emergency Medicine

## 2012-08-06 ENCOUNTER — Emergency Department (HOSPITAL_COMMUNITY)
Admission: EM | Admit: 2012-08-06 | Discharge: 2012-08-06 | Disposition: A | Discharge status: Home / Self Care | Payer: Medicare Other | Attending: Emergency Medicine | Admitting: Emergency Medicine

## 2012-08-06 ENCOUNTER — Emergency Department (HOSPITAL_COMMUNITY): Payer: Medicare Other

## 2012-08-06 DIAGNOSIS — W19XXXA Unspecified fall, initial encounter: Secondary | ICD-10-CM

## 2012-08-06 DIAGNOSIS — Z79899 Other long term (current) drug therapy: Secondary | ICD-10-CM | POA: Insufficient documentation

## 2012-08-06 DIAGNOSIS — W010XXA Fall on same level from slipping, tripping and stumbling without subsequent striking against object, initial encounter: Secondary | ICD-10-CM | POA: Insufficient documentation

## 2012-08-06 DIAGNOSIS — Y92009 Unspecified place in unspecified non-institutional (private) residence as the place of occurrence of the external cause: Secondary | ICD-10-CM | POA: Insufficient documentation

## 2012-08-06 DIAGNOSIS — W1809XA Striking against other object with subsequent fall, initial encounter: Secondary | ICD-10-CM | POA: Insufficient documentation

## 2012-08-06 DIAGNOSIS — M5104 Intervertebral disc disorders with myelopathy, thoracic region: Secondary | ICD-10-CM | POA: Insufficient documentation

## 2012-08-06 DIAGNOSIS — M1990 Unspecified osteoarthritis, unspecified site: Secondary | ICD-10-CM | POA: Insufficient documentation

## 2012-08-06 DIAGNOSIS — M5134 Other intervertebral disc degeneration, thoracic region: Secondary | ICD-10-CM

## 2012-08-06 DIAGNOSIS — E119 Type 2 diabetes mellitus without complications: Secondary | ICD-10-CM | POA: Insufficient documentation

## 2012-08-06 DIAGNOSIS — I10 Essential (primary) hypertension: Secondary | ICD-10-CM | POA: Insufficient documentation

## 2012-08-06 DIAGNOSIS — Y939 Activity, unspecified: Secondary | ICD-10-CM | POA: Insufficient documentation

## 2012-08-06 DIAGNOSIS — Z87891 Personal history of nicotine dependence: Secondary | ICD-10-CM | POA: Insufficient documentation

## 2012-08-06 LAB — POCT I-STAT, CHEM 8
BUN: 11 mg/dL (ref 6–23)
Chloride: 106 mEq/L (ref 96–112)
Glucose, Bld: 165 mg/dL — ABNORMAL HIGH (ref 70–99)
HCT: 42 % (ref 36.0–46.0)
Potassium: 3.9 mEq/L (ref 3.5–5.1)

## 2012-08-06 MED ORDER — IBUPROFEN 200 MG PO TABS
400.0000 mg | ORAL_TABLET | Freq: Once | ORAL | Status: AC
  Administered 2012-08-06: 400 mg via ORAL
  Filled 2012-08-06: qty 2

## 2012-08-06 MED ORDER — DIAZEPAM 2 MG PO TABS
2.0000 mg | ORAL_TABLET | Freq: Once | ORAL | Status: DC
  Filled 2012-08-06: qty 1

## 2012-08-06 MED ORDER — DIAZEPAM 2 MG PO TABS: 5.0000 mg | ORAL_TABLET | Freq: Four times a day (QID) | ORAL | Status: DC | PRN

## 2012-08-06 NOTE — ED Provider Notes (Signed)
History     CSN: [id number]  Arrival date & time 08/06/12  0941   First MD Initiated Contact with Patient 08/06/12 1015      Chief Complaint  Patient presents with  . Fall  . Back Pain    (Consider location/radiation/quality/duration/timing/severity/associated sxs/prior treatment) HPI Jenna Murphy is a 79 y.o. female with a history of diabetes and hypertension presents emergency department for evaluation status post mechanical fall.  Patient describes the incident as slipping when in kitchen with a falling forward motion catching herself on her table.  Point of impact was left shoulder on kitchen chair.  Since incident patient describes muscle spasms in the upper thoracic region and left shoulder pain.  She denies hitting her head, any loss of consciousness, change in vision, ataxia or disequilibrium, numbness or tingling of extremities, or inability to move any extremity.  Patient ambulatory to difficulty.  She denies being on blood thinners. Past Medical History  Diagnosis Date  . Diabetes mellitus     not on insulin  . Hypertension   . Gallstone pancreatitis 02/2008    Past Surgical History  Procedure Laterality Date  . Cholecystectomy      Family History  Problem Relation Age of Onset  . Heart attack Mother     Died of MI, dec at 72   . Cirrhosis Father     alcohol induced     History  Substance Use Topics  . Smoking status: Former Smoker -- 1.00 packs/day for 20 years    Types: Cigarettes    Quit date: 04/12/1968  . Smokeless tobacco: Never Used  . Alcohol Use: No    OB History   Grav Para Term Preterm Abortions TAB SAB Ect Mult Living                  Review of Systems Ten systems reviewed and are negative for acute change, except as noted in the HPI.   Allergies  Review of patient's allergies indicates no known allergies.  Home Medications   Current Outpatient Rx  Name  Route  Sig  Dispense  Refill  . atorvastatin (LIPITOR) 20 MG tablet    Oral   Take 20 mg by mouth daily.         Marland Kitchen diltiazem (TIAZAC) 120 MG 24 hr capsule   Oral   Take 120-240 mg by mouth daily. 240mg  in the morning and 120 mg at night         . glyBURIDE-metformin (GLUCOVANCE) 2.5-500 MG per tablet   Oral   Take 1 tablet by mouth 2 (two) times daily with a meal.         . EXPIRED: pantoprazole (PROTONIX) 40 MG tablet   Oral   Take 1 tablet (40 mg total) by mouth daily at 12 noon.   30 tablet   0     BP 170/87  Pulse 76  Temp(Src) 97.3 F (36.3 C) (Oral)  Resp 16  SpO2 96%  Physical Exam  Nursing note and vitals reviewed. Constitutional: She appears well-developed and well-nourished. No distress.  HENT:  Head: Normocephalic and atraumatic.  Eyes: Conjunctivae and EOM are normal.  Neck: Normal range of motion. Neck supple.  No bony step-offs or cervical spine tenderness to palpation.  Full normal range of motion.  Cardiovascular:  Intact distal pulses, capillary refill < 3 seconds  Musculoskeletal:  Paraspinal thoracic tenderness to palpation.  Bony tenderness along the left clavicle.  Painful range of motion of left shoulder.  All other extremities with normal ROM  Neurological:  No sensory deficit.  Strength 5/5 bilaterally. Good coordination, normal gait.   Skin: She is not diaphoretic.  Skin intact, no obvious deformity    ED Course  Procedures (including critical care time)  Labs Reviewed  POCT I-STAT, CHEM 8 - Abnormal; Notable for the following:    Glucose, Bld 165 (*)    All other components within normal limits   Dg Thoracic Spine 2 View  08/06/2012  *RADIOLOGY REPORT*  Clinical Data: History of fall complaining of back pain.  THORACIC SPINE - 2 VIEW  Comparison: No priors.  Findings: AP and lateral views of the thoracic spine demonstrate no definite acute displaced fracture or compression type fracture. There is extensive multilevel degenerative disc disease with multiple large marginal osteophytes throughout the mid  and lower thoracic spine. Alignment appears anatomic.  Visualized portions of the bony thorax also appear intact.  IMPRESSION: 1.  No definite acute radiographic abnormality of the thoracic spine. 2.  Severe multilevel degenerative disc disease, as above.   Original Report Authenticated By: Trudie Reed, M.D.    Dg Shoulder Left  08/06/2012  *RADIOLOGY REPORT*  Clinical Data: Plane left shoulder pain.  LEFT SHOULDER - 2+ VIEW  Comparison: 10/31/2008.  Findings: Three views of the left shoulder demonstrate no acute displaced fracture, subluxation or dislocation.  There is advanced joint space narrowing, subchondral sclerosis, subchondral cyst formation and osteophyte formation in the glenohumeral joint, and to a much lesser extent in the acromioclavicular joint, compatible with osteoarthritis.  Findings have progressed compared to the prior examination.  IMPRESSION: 1.  Advanced osteoarthritis of the glenohumeral joint, and mild osteoarthritis of the acromioclavicular joint. 2.  No acute findings.   Original Report Authenticated By: Trudie Reed, M.D.     The patient denies any neck pain. There is no tenderness on palpation of the cervical spine and no step-offs. The patient can look to the left and right voluntarily without pain and flex and extend the neck without pain. Cervical collar cleared.  No diagnosis found.    MDM  79 year old female presented to ER status post mechanical fall. No head impact or LOC, low concern for closed head injury. Imaging and labs reviewed without any fractures or dislocations.  Pain managed in emergency department.  Conservative home therapy is discussed.  Patient followup with primary care physician.        Jaci Carrel, [place] 08/06/12 1127

## 2012-08-06 NOTE — ED Notes (Signed)
Instructed to take Aleve as directed at home- pt understood

## 2012-08-06 NOTE — ED Notes (Signed)
Patient slipped and fell in the kitchen.  Patient denies LOC.  Patient presents with left shoulder and thoracic pain.

## 2012-08-06 NOTE — ED Provider Notes (Signed)
Patient states she slipped and started falling and she was sliding across her kitchen table and her left shoulder hit a chair. This happened 2 days ago. She complains of pain in her back diffusely in the upper portion and also in her left shoulder/clavicle area. There's no bruising seen in the area. She is tender diffusely.  Medical screening examination/treatment/procedure(s) were conducted as a shared visit with non-physician practitioner(s) and myself.  I personally evaluated the patient during the encounter  Devoria Albe, MD, Franz Dell, MD 08/06/12 1136

## 2012-09-01 ENCOUNTER — Ambulatory Visit
Admission: RE | Admit: 2012-09-01 | Discharge: 2012-09-01 | Disposition: A | Discharge status: Home / Self Care | Payer: Medicare Other | Source: Ambulatory Visit | Attending: Internal Medicine | Admitting: Internal Medicine

## 2012-09-01 ENCOUNTER — Other Ambulatory Visit: Payer: Self-pay | Admitting: Internal Medicine

## 2012-09-01 DIAGNOSIS — W19XXXA Unspecified fall, initial encounter: Secondary | ICD-10-CM

## 2012-09-01 DIAGNOSIS — M549 Dorsalgia, unspecified: Secondary | ICD-10-CM

## 2013-01-16 ENCOUNTER — Ambulatory Visit (INDEPENDENT_AMBULATORY_CARE_PROVIDER_SITE_OTHER): Payer: BLUE CROSS/BLUE SHIELD | Admitting: Radiology

## 2013-01-16 DIAGNOSIS — Z23 Encounter for immunization: Secondary | ICD-10-CM

## 2014-03-18 ENCOUNTER — Ambulatory Visit: Payer: No Typology Code available for payment source | Admitting: Radiology

## 2014-03-18 ENCOUNTER — Ambulatory Visit (INDEPENDENT_AMBULATORY_CARE_PROVIDER_SITE_OTHER): Payer: No Typology Code available for payment source | Admitting: Radiology

## 2014-03-18 DIAGNOSIS — Z23 Encounter for immunization: Secondary | ICD-10-CM

## 2014-04-25 ENCOUNTER — Other Ambulatory Visit: Payer: Self-pay | Admitting: Orthopedic Surgery

## 2014-05-15 ENCOUNTER — Encounter (HOSPITAL_COMMUNITY)
Admission: RE | Admit: 2014-05-15 | Discharge: 2014-05-15 | Disposition: A | Discharge status: Home / Self Care | Payer: Medicare Other | Source: Ambulatory Visit | Attending: Orthopedic Surgery | Admitting: Orthopedic Surgery

## 2014-05-15 ENCOUNTER — Encounter (HOSPITAL_COMMUNITY): Payer: Self-pay

## 2014-05-15 DIAGNOSIS — Z01818 Encounter for other preprocedural examination: Secondary | ICD-10-CM | POA: Diagnosis not present

## 2014-05-15 DIAGNOSIS — Z01812 Encounter for preprocedural laboratory examination: Secondary | ICD-10-CM | POA: Diagnosis not present

## 2014-05-15 DIAGNOSIS — Z87891 Personal history of nicotine dependence: Secondary | ICD-10-CM | POA: Insufficient documentation

## 2014-05-15 DIAGNOSIS — I10 Essential (primary) hypertension: Secondary | ICD-10-CM | POA: Insufficient documentation

## 2014-05-15 DIAGNOSIS — Z0183 Encounter for blood typing: Secondary | ICD-10-CM | POA: Insufficient documentation

## 2014-05-15 DIAGNOSIS — R9431 Abnormal electrocardiogram [ECG] [EKG]: Secondary | ICD-10-CM | POA: Diagnosis not present

## 2014-05-15 DIAGNOSIS — I491 Atrial premature depolarization: Secondary | ICD-10-CM | POA: Insufficient documentation

## 2014-05-15 DIAGNOSIS — E119 Type 2 diabetes mellitus without complications: Secondary | ICD-10-CM | POA: Diagnosis not present

## 2014-05-15 HISTORY — DX: Acute poliomyelitis, unspecified: A80.9

## 2014-05-15 HISTORY — DX: Unspecified osteoarthritis, unspecified site: M19.90

## 2014-05-15 LAB — URINALYSIS, ROUTINE W REFLEX MICROSCOPIC
Bilirubin Urine: NEGATIVE
Glucose, UA: NEGATIVE mg/dL
Hgb urine dipstick: NEGATIVE
Ketones, ur: NEGATIVE mg/dL
NITRITE: NEGATIVE
Protein, ur: NEGATIVE mg/dL
Specific Gravity, Urine: 1.011 (ref 1.005–1.030)
Urobilinogen, UA: 0.2 mg/dL (ref 0.0–1.0)
pH: 5 (ref 5.0–8.0)

## 2014-05-15 LAB — URINE MICROSCOPIC-ADD ON

## 2014-05-15 LAB — CBC WITH DIFFERENTIAL/PLATELET
BASOS ABS: 0 10*3/uL (ref 0.0–0.1)
Basophils Relative: 0 % (ref 0–1)
Eosinophils Absolute: 0.1 10*3/uL (ref 0.0–0.7)
Eosinophils Relative: 1 % (ref 0–5)
HCT: 40.2 % (ref 36.0–46.0)
HEMOGLOBIN: 12.9 g/dL (ref 12.0–15.0)
LYMPHS ABS: 2.9 10*3/uL (ref 0.7–4.0)
Lymphocytes Relative: 42 % (ref 12–46)
MCH: 25.6 pg — ABNORMAL LOW (ref 26.0–34.0)
MCHC: 32.1 g/dL (ref 30.0–36.0)
MCV: 79.8 fL (ref 78.0–100.0)
Monocytes Absolute: 0.4 10*3/uL (ref 0.1–1.0)
Monocytes Relative: 6 % (ref 3–12)
Neutro Abs: 3.6 10*3/uL (ref 1.7–7.7)
Neutrophils Relative %: 51 % (ref 43–77)
PLATELETS: 301 10*3/uL (ref 150–400)
RBC: 5.04 MIL/uL (ref 3.87–5.11)
RDW: 14.6 % (ref 11.5–15.5)
WBC: 7 10*3/uL (ref 4.0–10.5)

## 2014-05-15 LAB — COMPREHENSIVE METABOLIC PANEL
ALT: 19 U/L (ref 0–35)
AST: 26 U/L (ref 0–37)
Albumin: 4.1 g/dL (ref 3.5–5.2)
Alkaline Phosphatase: 86 U/L (ref 39–117)
Anion gap: 5 (ref 5–15)
BUN: 14 mg/dL (ref 6–23)
CO2: 30 mmol/L (ref 19–32)
CREATININE: 1.01 mg/dL (ref 0.50–1.10)
Calcium: 9.5 mg/dL (ref 8.4–10.5)
Chloride: 104 mmol/L (ref 96–112)
GFR calc Af Amer: 58 mL/min — ABNORMAL LOW (ref >90)
GFR calc non Af Amer: 50 mL/min — ABNORMAL LOW (ref >90)
GLUCOSE: 158 mg/dL — AB (ref 70–99)
Potassium: 3.6 mmol/L (ref 3.5–5.1)
Sodium: 139 mmol/L (ref 135–145)
Total Bilirubin: 0.5 mg/dL (ref 0.3–1.2)
Total Protein: 8 g/dL (ref 6.0–8.3)

## 2014-05-15 LAB — SURGICAL PCR SCREEN
MRSA, PCR: NEGATIVE
Staphylococcus aureus: POSITIVE — AB

## 2014-05-15 LAB — GLUCOSE, CAPILLARY: Glucose-Capillary: 202 mg/dL — ABNORMAL HIGH (ref 70–99)

## 2014-05-15 LAB — PROTIME-INR
INR: 1.02 (ref 0.00–1.49)
PROTHROMBIN TIME: 13.5 s (ref 11.6–15.2)

## 2014-05-15 LAB — TYPE AND SCREEN
ABO/RH(D): O POS
Antibody Screen: NEGATIVE

## 2014-05-15 LAB — APTT: aPTT: 26 seconds (ref 24–37)

## 2014-05-15 LAB — ABO/RH: ABO/RH(D): O POS

## 2014-05-15 NOTE — Pre-Procedure Instructions (Signed)
Jenna Murphy  05/15/2014   Your procedure is scheduled on:    Thursday  05/23/14  [facility] 530 AM.  Call this number if you have problems the morning of surgery: [phone]   Remember:   Do not eat food or drink liquids after midnight.   Take these medicines the morning of surgery with A SIP OF WATER:  CARTIA  XT     Do not wear jewelry, make-up or nail polish.  Do not wear lotions, powders, or perfumes. You may wear deodorant.  Do not shave 48 hours prior to surgery. Men may shave face and neck.  Do not bring valuables to the hospital.  [facility] is not responsible                  for any belongings or valuables.               Contacts, dentures or bridgework may not be worn into surgery.  Leave suitcase in the car. After surgery it may be brought to your room.  For patients admitted to the hospital, discharge time is determined by your                treatment team.               Patients discharged the day of surgery will not be allowed to drive  home.  Name and phone number of your driver:   Special Instructions: [place] - Preparing for Surgery  Before surgery, you can play an important role.  Because skin is not sterile, your skin needs to be as free of germs as possible.  You can reduce the number of germs on you skin by washing with CHG (chlorahexidine gluconate) soap before surgery.  CHG is an antiseptic cleaner which kills germs and bonds with the skin to continue killing germs even after washing.  Please DO NOT use if you have an allergy to CHG or antibacterial soaps.  If your skin becomes reddened/irritated stop using the CHG and inform your nurse when you arrive at Short Stay.  Do not shave (including legs and underarms) for at least 48 hours prior to the first CHG shower.  You may shave your face.  Please follow these instructions carefully:   1.  Shower with CHG Soap the night before surgery and the                                 morning of Surgery.  2.  If you choose to wash your hair, wash your hair first as usual with your       normal shampoo.  3.  After you shampoo, rinse your hair and body thoroughly to remove the                      Shampoo.  4.  Use CHG as you would any other liquid soap.  You can apply chg directly       to the skin and wash gently with scrungie or a clean washcloth.  5.  Apply the CHG Soap to your body ONLY FROM THE NECK DOWN.        Do not use on open wounds or open sores.  Avoid contact with your eyes,       ears, mouth and genitals (private parts).  Wash genitals (private parts)  with your normal soap.  6.  Wash thoroughly, paying special attention to the area where your surgery        will be performed.  7.  Thoroughly rinse your body with warm water from the neck down.  8.  DO NOT shower/wash with your normal soap after using and rinsing off       the CHG Soap.  9.  Pat yourself dry with a clean towel.            10.  Wear clean pajamas.            11.  Place clean sheets on your bed the night of your first shower and do not        sleep with pets.  Day of Surgery  Do not apply any lotions/deoderants the morning of surgery.  Please wear clean clothes to the hospital/surgery center.     Please read over the following fact sheets that you were given: Pain Booklet, Coughing and Deep Breathing, Blood Transfusion Information, MRSA Information and Surgical Site Infection Prevention

## 2014-05-15 NOTE — Progress Notes (Signed)
Called mupirocin [facility] into [facility] [phone].

## 2014-05-16 LAB — HEMOGLOBIN A1C
Hgb A1c MFr Bld: 7.6 % — ABNORMAL HIGH (ref 4.8–5.6)
MEAN PLASMA GLUCOSE: 171 mg/dL

## 2014-05-16 NOTE — Progress Notes (Signed)
Anesthesia Chart Review:  Pt is 81 year old female scheduled for L total shoulder arthroplasty vs reverse total shoulder arthroplasty on 05/23/2014 with Dr. Ave Filterhandler.   PMH includes: HTN, DM, polio. Former smoker.   Preoperative labs reviewed.    Chest x-ray reviewed. Mild scarring left base. No edema or consolidation.   EKG: Sinus rhythm with Premature supraventricular complexes. Left axis deviation. Moderate voltage criteria for LVH, may be normal variant. Cannot rule out Septal infarct , age undetermined. Dr. Elease HashimotoNahser interpreted EKG as no significant change since last tracing (2013).   Nuclear stress test 12/17/2000: Impression:  A small region of anteroapical ischemia cannot be excluded based on the nuclear medicine study. There is a component of mild fixed attenuation of the apex either on the basis of thinning, scar or breast attenuation. However the small defect is definitely more prominent on the stress study raising the possibility of ischemia.   Reviewed with Dr. Okey Dupreose.   If no changes, I anticipate pt can proceed with surgery as scheduled.   Rica Mastngela Kaziah Krizek, FNP-BC [facility]An[facility]hesiology Phone: [phone] 05/16/2014 3:23 PM

## 2014-05-22 MED ORDER — CEFAZOLIN SODIUM-DEXTROSE 2-3 GM-% IV SOLR
2.0000 g | INTRAVENOUS | Status: AC
  Administered 2014-05-23: 2 g via INTRAVENOUS
  Filled 2014-05-22: qty 50

## 2014-05-22 NOTE — Anesthesia Preprocedure Evaluation (Addendum)
Anesthesia Evaluation  Patient identified by MRN, date of birth, ID band Patient awake    Reviewed: Allergy & Precautions, NPO status , Patient's Chart, lab work & pertinent test results  Airway Mallampati: II  TM Distance: >3 FB Neck ROM: Full    Dental no notable dental hx.    Pulmonary former smoker,  breath sounds clear to auscultation  Pulmonary exam normal       Cardiovascular hypertension, Pt. on medications Rhythm:Regular Rate:Normal     Neuro/Psych negative neurological ROS  negative psych ROS   GI/Hepatic negative GI ROS, Neg liver ROS,   Endo/Other  negative endocrine ROSdiabetes, Type 2, Oral Hypoglycemic Agents  Renal/GU negative Renal ROS     Musculoskeletal  (+) Arthritis -,   Abdominal   Peds  Hematology negative hematology ROS (+)   Anesthesia Other Findings   Reproductive/Obstetrics negative OB ROS                             Anesthesia Physical Anesthesia Plan  ASA: II  Anesthesia Plan: General   Post-op Pain Management:    Induction: Intravenous  Airway Management Planned: Oral ETT  Additional Equipment:   Intra-op Plan:   Post-operative Plan: Extubation in OR  Informed Consent: I have reviewed the patients History and Physical, chart, labs and discussed the procedure including the risks, benefits and alternatives for the proposed anesthesia with the patient or authorized representative who has indicated his/her understanding and acceptance.   Dental advisory given  Plan Discussed with: CRNA  Anesthesia Plan Comments:         Anesthesia Quick Evaluation

## 2014-05-23 ENCOUNTER — Inpatient Hospital Stay (HOSPITAL_COMMUNITY): Payer: Medicare Other

## 2014-05-23 ENCOUNTER — Inpatient Hospital Stay (HOSPITAL_COMMUNITY)
Admission: RE | Admit: 2014-05-23 | Discharge: 2014-05-25 | DRG: 483 | Disposition: A | Discharge status: Home / Self Care | Payer: Medicare Other | Source: Ambulatory Visit | Attending: Orthopedic Surgery | Admitting: Orthopedic Surgery

## 2014-05-23 ENCOUNTER — Encounter (HOSPITAL_COMMUNITY)
Admission: RE | Disposition: A | Discharge status: Home / Self Care | Payer: Self-pay | Source: Ambulatory Visit | Attending: Orthopedic Surgery

## 2014-05-23 ENCOUNTER — Inpatient Hospital Stay (HOSPITAL_COMMUNITY): Payer: Medicare Other | Admitting: Vascular Surgery

## 2014-05-23 ENCOUNTER — Inpatient Hospital Stay (HOSPITAL_COMMUNITY): Payer: Medicare Other | Admitting: Anesthesiology

## 2014-05-23 ENCOUNTER — Encounter (HOSPITAL_COMMUNITY): Payer: Self-pay | Admitting: *Deleted

## 2014-05-23 DIAGNOSIS — E119 Type 2 diabetes mellitus without complications: Secondary | ICD-10-CM | POA: Diagnosis present

## 2014-05-23 DIAGNOSIS — Z87891 Personal history of nicotine dependence: Secondary | ICD-10-CM | POA: Diagnosis not present

## 2014-05-23 DIAGNOSIS — Z9049 Acquired absence of other specified parts of digestive tract: Secondary | ICD-10-CM | POA: Diagnosis present

## 2014-05-23 DIAGNOSIS — I10 Essential (primary) hypertension: Secondary | ICD-10-CM | POA: Diagnosis present

## 2014-05-23 DIAGNOSIS — M19012 Primary osteoarthritis, left shoulder: Secondary | ICD-10-CM | POA: Diagnosis present

## 2014-05-23 DIAGNOSIS — M25512 Pain in left shoulder: Secondary | ICD-10-CM

## 2014-05-23 DIAGNOSIS — Z96612 Presence of left artificial shoulder joint: Secondary | ICD-10-CM

## 2014-05-23 DIAGNOSIS — Z96619 Presence of unspecified artificial shoulder joint: Secondary | ICD-10-CM

## 2014-05-23 HISTORY — PX: REVERSE SHOULDER ARTHROPLASTY: SHX5054

## 2014-05-23 HISTORY — PX: STERIOD INJECTION: SHX5046

## 2014-05-23 LAB — GLUCOSE, CAPILLARY
Glucose-Capillary: 156 mg/dL — ABNORMAL HIGH (ref 70–99)
Glucose-Capillary: 139 mg/dL — ABNORMAL HIGH (ref 70–99)
Glucose-Capillary: 173 mg/dL — ABNORMAL HIGH (ref 70–99)
Glucose-Capillary: 150 mg/dL — ABNORMAL HIGH (ref 70–99)

## 2014-05-23 SURGERY — ARTHROPLASTY, SHOULDER, TOTAL, REVERSE
Anesthesia: Regional | Site: Shoulder | Laterality: Left

## 2014-05-23 MED ORDER — ASPIRIN EC 325 MG PO TBEC
325.0000 mg | DELAYED_RELEASE_TABLET | Freq: Two times a day (BID) | ORAL | Status: DC
  Administered 2014-05-23 – 2014-05-25 (×4): 325 mg via ORAL
  Filled 2014-05-23 (×6): qty 1

## 2014-05-23 MED ORDER — PHENYLEPHRINE HCL 10 MG/ML IJ SOLN
INTRAMUSCULAR | Status: DC | PRN
  Administered 2014-05-23: 80 ug via INTRAVENOUS

## 2014-05-23 MED ORDER — ACETAMINOPHEN 325 MG PO TABS: 650.0000 mg | ORAL_TABLET | Freq: Four times a day (QID) | ORAL | Status: DC | PRN

## 2014-05-23 MED ORDER — FENTANYL CITRATE 0.05 MG/ML IJ SOLN
INTRAMUSCULAR | Status: AC
  Filled 2014-05-23: qty 5

## 2014-05-23 MED ORDER — OXYCODONE-ACETAMINOPHEN 5-325 MG PO TABS
1.0000 | ORAL_TABLET | ORAL | Status: DC | PRN
  Administered 2014-05-23: 2 via ORAL
  Administered 2014-05-23: 1 via ORAL
  Administered 2014-05-24 (×3): 2 via ORAL
  Filled 2014-05-23: qty 2
  Filled 2014-05-23: qty 1
  Filled 2014-05-23 (×3): qty 2

## 2014-05-23 MED ORDER — PHENYLEPHRINE HCL 10 MG/ML IJ SOLN
10.0000 mg | INTRAMUSCULAR | Status: DC | PRN
  Administered 2014-05-23: 10 ug/min via INTRAVENOUS

## 2014-05-23 MED ORDER — POLYETHYLENE GLYCOL 3350 17 G PO PACK: 17.0000 g | PACK | Freq: Every day | ORAL | Status: DC | PRN

## 2014-05-23 MED ORDER — ONDANSETRON HCL 4 MG PO TABS: 4.0000 mg | ORAL_TABLET | Freq: Four times a day (QID) | ORAL | Status: DC | PRN

## 2014-05-23 MED ORDER — SODIUM CHLORIDE 0.9 % IV SOLN
INTRAVENOUS | Status: DC
  Administered 2014-05-23 – 2014-05-24 (×2): via INTRAVENOUS

## 2014-05-23 MED ORDER — OXYCODONE HCL 5 MG PO TABS
5.0000 mg | ORAL_TABLET | ORAL | Status: DC | PRN
  Administered 2014-05-24 – 2014-05-25 (×4): 10 mg via ORAL
  Filled 2014-05-23 (×4): qty 2

## 2014-05-23 MED ORDER — INSULIN ASPART 100 UNIT/ML ~~LOC~~ SOLN
0.0000 [IU] | Freq: Three times a day (TID) | SUBCUTANEOUS | Status: DC
  Administered 2014-05-23: 3 [IU] via SUBCUTANEOUS
  Administered 2014-05-24: 100 [IU] via SUBCUTANEOUS
  Administered 2014-05-24 – 2014-05-25 (×2): 3 [IU] via SUBCUTANEOUS

## 2014-05-23 MED ORDER — ALBIGLUTIDE 30 MG ~~LOC~~ PEN: 30.0000 mg | PEN_INJECTOR | SUBCUTANEOUS | Status: DC

## 2014-05-23 MED ORDER — NEOSTIGMINE METHYLSULFATE 10 MG/10ML IV SOLN
INTRAVENOUS | Status: DC | PRN
  Administered 2014-05-23: 4 mg via INTRAVENOUS

## 2014-05-23 MED ORDER — PHENOL 1.4 % MT LIQD: 1.0000 | OROMUCOSAL | Status: DC | PRN

## 2014-05-23 MED ORDER — HYDROMORPHONE HCL 1 MG/ML IJ SOLN: 0.2500 mg | INTRAMUSCULAR | Status: DC | PRN

## 2014-05-23 MED ORDER — TRIAMCINOLONE ACETONIDE 40 MG/ML IJ SUSP
INTRAMUSCULAR | Status: DC | PRN
  Administered 2014-05-23: 20 mg via INTRAMUSCULAR

## 2014-05-23 MED ORDER — PROPOFOL 10 MG/ML IV BOLUS
INTRAVENOUS | Status: DC | PRN
  Administered 2014-05-23: 40 mg via INTRAVENOUS
  Administered 2014-05-23: 160 mg via INTRAVENOUS

## 2014-05-23 MED ORDER — DILTIAZEM HCL ER COATED BEADS 120 MG PO CP24
120.0000 mg | ORAL_CAPSULE | Freq: Every day | ORAL | Status: DC
  Administered 2014-05-24 – 2014-05-25 (×2): 120 mg via ORAL
  Filled 2014-05-23 (×3): qty 1

## 2014-05-23 MED ORDER — MEPERIDINE HCL 25 MG/ML IJ SOLN: 6.2500 mg | INTRAMUSCULAR | Status: DC | PRN

## 2014-05-23 MED ORDER — BUPIVACAINE HCL (PF) 0.25 % IJ SOLN
INTRAMUSCULAR | Status: AC
  Filled 2014-05-23: qty 30

## 2014-05-23 MED ORDER — NEOSTIGMINE METHYLSULFATE 10 MG/10ML IV SOLN
INTRAVENOUS | Status: AC
  Filled 2014-05-23: qty 1

## 2014-05-23 MED ORDER — GLYBURIDE-METFORMIN 2.5-500 MG PO TABS: 1.0000 | ORAL_TABLET | Freq: Two times a day (BID) | ORAL | Status: DC

## 2014-05-23 MED ORDER — EPHEDRINE SULFATE 50 MG/ML IJ SOLN
INTRAMUSCULAR | Status: AC
  Filled 2014-05-23: qty 1

## 2014-05-23 MED ORDER — ONDANSETRON HCL 4 MG/2ML IJ SOLN
INTRAMUSCULAR | Status: DC | PRN
  Administered 2014-05-23: 4 mg via INTRAVENOUS

## 2014-05-23 MED ORDER — HYDROCODONE-ACETAMINOPHEN 5-325 MG PO TABS
1.0000 | ORAL_TABLET | ORAL | Status: DC | PRN
  Administered 2014-05-25 (×2): 2 via ORAL
  Filled 2014-05-23 (×2): qty 2

## 2014-05-23 MED ORDER — ARTIFICIAL TEARS OP OINT
TOPICAL_OINTMENT | OPHTHALMIC | Status: AC
  Filled 2014-05-23: qty 3.5

## 2014-05-23 MED ORDER — 0.9 % SODIUM CHLORIDE (POUR BTL) OPTIME
TOPICAL | Status: DC | PRN
  Administered 2014-05-23: 1000 mL

## 2014-05-23 MED ORDER — ACETAMINOPHEN 650 MG RE SUPP: 650.0000 mg | Freq: Four times a day (QID) | RECTAL | Status: DC | PRN

## 2014-05-23 MED ORDER — ONDANSETRON HCL 4 MG/2ML IJ SOLN: 4.0000 mg | Freq: Four times a day (QID) | INTRAMUSCULAR | Status: DC | PRN

## 2014-05-23 MED ORDER — LIDOCAINE HCL (CARDIAC) 20 MG/ML IV SOLN
INTRAVENOUS | Status: AC
  Filled 2014-05-23: qty 5

## 2014-05-23 MED ORDER — SUCCINYLCHOLINE CHLORIDE 20 MG/ML IJ SOLN
INTRAMUSCULAR | Status: AC
  Filled 2014-05-23: qty 1

## 2014-05-23 MED ORDER — OXYCODONE-ACETAMINOPHEN 5-325 MG PO TABS: 1.0000 | ORAL_TABLET | ORAL | Status: DC | PRN

## 2014-05-23 MED ORDER — ARTIFICIAL TEARS OP OINT
TOPICAL_OINTMENT | OPHTHALMIC | Status: DC | PRN
  Administered 2014-05-23: 1 via OPHTHALMIC

## 2014-05-23 MED ORDER — IRBESARTAN 300 MG PO TABS
300.0000 mg | ORAL_TABLET | Freq: Every day | ORAL | Status: DC
  Administered 2014-05-24 – 2014-05-25 (×2): 300 mg via ORAL
  Filled 2014-05-23 (×2): qty 1

## 2014-05-23 MED ORDER — ATORVASTATIN CALCIUM 20 MG PO TABS
20.0000 mg | ORAL_TABLET | Freq: Every day | ORAL | Status: DC
  Administered 2014-05-24 – 2014-05-25 (×2): 20 mg via ORAL
  Filled 2014-05-23 (×3): qty 1

## 2014-05-23 MED ORDER — METOCLOPRAMIDE HCL 5 MG/ML IJ SOLN: 5.0000 mg | Freq: Three times a day (TID) | INTRAMUSCULAR | Status: DC | PRN

## 2014-05-23 MED ORDER — LACTATED RINGERS IV SOLN
INTRAVENOUS | Status: DC | PRN
  Administered 2014-05-23 (×2): via INTRAVENOUS

## 2014-05-23 MED ORDER — TRIAMCINOLONE ACETONIDE 40 MG/ML IJ SUSP
INTRAMUSCULAR | Status: AC
  Filled 2014-05-23: qty 5

## 2014-05-23 MED ORDER — DIPHENHYDRAMINE HCL 12.5 MG/5ML PO ELIX: 12.5000 mg | ORAL_SOLUTION | ORAL | Status: DC | PRN

## 2014-05-23 MED ORDER — HYDROCHLOROTHIAZIDE 25 MG PO TABS
25.0000 mg | ORAL_TABLET | Freq: Every day | ORAL | Status: DC
  Administered 2014-05-24 – 2014-05-25 (×2): 25 mg via ORAL
  Filled 2014-05-23 (×2): qty 1

## 2014-05-23 MED ORDER — FLEET ENEMA 7-19 GM/118ML RE ENEM: 1.0000 | ENEMA | Freq: Once | RECTAL | Status: AC | PRN

## 2014-05-23 MED ORDER — SODIUM CHLORIDE 0.9 % IJ SOLN
INTRAMUSCULAR | Status: AC
  Filled 2014-05-23: qty 10

## 2014-05-23 MED ORDER — ALUMINUM HYDROXIDE GEL 320 MG/5ML PO SUSP
15.0000 mL | ORAL | Status: DC | PRN
  Filled 2014-05-23: qty 30

## 2014-05-23 MED ORDER — DOCUSATE SODIUM 100 MG PO CAPS
100.0000 mg | ORAL_CAPSULE | Freq: Two times a day (BID) | ORAL | Status: DC
  Administered 2014-05-23 – 2014-05-25 (×4): 100 mg via ORAL
  Filled 2014-05-23 (×5): qty 1

## 2014-05-23 MED ORDER — LIDOCAINE HCL (CARDIAC) 20 MG/ML IV SOLN
INTRAVENOUS | Status: DC | PRN
  Administered 2014-05-23: 60 mg via INTRAVENOUS

## 2014-05-23 MED ORDER — MORPHINE SULFATE 2 MG/ML IJ SOLN
1.0000 mg | INTRAMUSCULAR | Status: DC | PRN
  Administered 2014-05-23: 1 mg via INTRAVENOUS
  Filled 2014-05-23: qty 1

## 2014-05-23 MED ORDER — DOCUSATE SODIUM 100 MG PO CAPS: 100.0000 mg | ORAL_CAPSULE | Freq: Three times a day (TID) | ORAL | Status: DC | PRN

## 2014-05-23 MED ORDER — BISACODYL 10 MG RE SUPP: 10.0000 mg | Freq: Every day | RECTAL | Status: DC | PRN

## 2014-05-23 MED ORDER — PROMETHAZINE HCL 25 MG/ML IJ SOLN: 6.2500 mg | INTRAMUSCULAR | Status: DC | PRN

## 2014-05-23 MED ORDER — GLYCOPYRROLATE 0.2 MG/ML IJ SOLN
INTRAMUSCULAR | Status: AC
  Filled 2014-05-23: qty 3

## 2014-05-23 MED ORDER — MENTHOL 3 MG MT LOZG: 1.0000 | LOZENGE | OROMUCOSAL | Status: DC | PRN

## 2014-05-23 MED ORDER — FENTANYL CITRATE 0.05 MG/ML IJ SOLN
INTRAMUSCULAR | Status: DC | PRN
  Administered 2014-05-23: 25 ug via INTRAVENOUS
  Administered 2014-05-23 (×4): 50 ug via INTRAVENOUS
  Administered 2014-05-23: 25 ug via INTRAVENOUS

## 2014-05-23 MED ORDER — PROPOFOL 10 MG/ML IV BOLUS
INTRAVENOUS | Status: AC
  Filled 2014-05-23: qty 20

## 2014-05-23 MED ORDER — ONDANSETRON HCL 4 MG/2ML IJ SOLN
INTRAMUSCULAR | Status: AC
  Filled 2014-05-23: qty 2

## 2014-05-23 MED ORDER — PHENYLEPHRINE HCL 10 MG/ML IJ SOLN
INTRAMUSCULAR | Status: AC
  Filled 2014-05-23: qty 1

## 2014-05-23 MED ORDER — BUPIVACAINE HCL (PF) 0.25 % IJ SOLN
INTRAMUSCULAR | Status: DC | PRN
  Administered 2014-05-23: 5 mL

## 2014-05-23 MED ORDER — PHENYLEPHRINE 40 MCG/ML (10ML) SYRINGE FOR IV PUSH (FOR BLOOD PRESSURE SUPPORT)
PREFILLED_SYRINGE | INTRAVENOUS | Status: AC
  Filled 2014-05-23: qty 10

## 2014-05-23 MED ORDER — MIDAZOLAM HCL 2 MG/2ML IJ SOLN
INTRAMUSCULAR | Status: AC
  Filled 2014-05-23: qty 2

## 2014-05-23 MED ORDER — VALSARTAN-HYDROCHLOROTHIAZIDE 320-25 MG PO TABS: 1.0000 | ORAL_TABLET | Freq: Every day | ORAL | Status: DC

## 2014-05-23 MED ORDER — ROCURONIUM BROMIDE 100 MG/10ML IV SOLN
INTRAVENOUS | Status: DC | PRN
  Administered 2014-05-23: 10 mg via INTRAVENOUS
  Administered 2014-05-23: 50 mg via INTRAVENOUS

## 2014-05-23 MED ORDER — METOCLOPRAMIDE HCL 5 MG PO TABS
5.0000 mg | ORAL_TABLET | Freq: Three times a day (TID) | ORAL | Status: DC | PRN
  Filled 2014-05-23: qty 2

## 2014-05-23 MED ORDER — METFORMIN HCL 500 MG PO TABS
500.0000 mg | ORAL_TABLET | Freq: Two times a day (BID) | ORAL | Status: DC
  Administered 2014-05-23 – 2014-05-25 (×4): 500 mg via ORAL
  Filled 2014-05-23 (×6): qty 1

## 2014-05-23 MED ORDER — ROPIVACAINE HCL 5 MG/ML IJ SOLN
INTRAMUSCULAR | Status: DC | PRN
  Administered 2014-05-23 (×2): 20 mL via PERINEURAL

## 2014-05-23 MED ORDER — CEFAZOLIN SODIUM 1-5 GM-% IV SOLN
1.0000 g | Freq: Four times a day (QID) | INTRAVENOUS | Status: AC
  Administered 2014-05-23 – 2014-05-24 (×2): 1 g via INTRAVENOUS
  Filled 2014-05-23 (×4): qty 50

## 2014-05-23 MED ORDER — ROCURONIUM BROMIDE 50 MG/5ML IV SOLN
INTRAVENOUS | Status: AC
  Filled 2014-05-23: qty 1

## 2014-05-23 MED ORDER — POVIDONE-IODINE 7.5 % EX SOLN: Freq: Once | CUTANEOUS | Status: DC

## 2014-05-23 MED ORDER — GLYBURIDE 2.5 MG PO TABS
2.5000 mg | ORAL_TABLET | Freq: Two times a day (BID) | ORAL | Status: DC
  Administered 2014-05-23 – 2014-05-25 (×4): 2.5 mg via ORAL
  Filled 2014-05-23 (×7): qty 1

## 2014-05-23 MED ORDER — GLYCOPYRROLATE 0.2 MG/ML IJ SOLN
INTRAMUSCULAR | Status: DC | PRN
  Administered 2014-05-23: 0.6 mg via INTRAVENOUS

## 2014-05-23 SURGICAL SUPPLY — 75 items
ALIGNMENT PIN ×3 IMPLANT
BIT DRILL 3.0 (BIT) IMPLANT
BIT DRILL 5/64X5 DISP (BIT) IMPLANT
BIT DRILL HEX (BIT) IMPLANT
BLADE SAW SAG 73X25 THK (BLADE) ×2
BLADE SAW SGTL 73X25 THK (BLADE) ×2 IMPLANT
BLADE SURG 15 STRL LF DISP TIS (BLADE) ×2 IMPLANT
BLADE SURG 15 STRL SS (BLADE) ×4
CAP SHOULDER REVTOTAL 2 ×2 IMPLANT
CHLORAPREP W/TINT 26ML (MISCELLANEOUS) ×4 IMPLANT
CLOSURE STERI-STRIP 1/2X4 (GAUZE/BANDAGES/DRESSINGS) ×2
CLOSURE WOUND 1/2 X4 (GAUZE/BANDAGES/DRESSINGS) ×1
CLSR STERI-STRIP ANTIMIC 1/2X4 (GAUZE/BANDAGES/DRESSINGS) ×2 IMPLANT
COVER MAYO STAND STRL (DRAPES) ×4 IMPLANT
COVER SURGICAL LIGHT HANDLE (MISCELLANEOUS) ×4 IMPLANT
DRAPE IMP U-DRAPE 54X76 (DRAPES) ×4 IMPLANT
DRAPE INCISE IOBAN 66X45 STRL (DRAPES) ×8 IMPLANT
DRAPE ORTHO SPLIT 77X108 STRL (DRAPES)
DRAPE SURG 17X23 STRL (DRAPES) ×4 IMPLANT
DRAPE SURG ORHT 6 SPLT 77X108 (DRAPES) ×2 IMPLANT
DRAPE U-SHAPE 47X51 STRL (DRAPES) ×4 IMPLANT
DRILL BIT 3.0 (BIT) ×4
DRILL BIT HEX (BIT) ×4
DRSG AQUACEL AG ADV 3.5X10 (GAUZE/BANDAGES/DRESSINGS) ×3 IMPLANT
ELECT BLADE 4.0 EZ CLEAN MEGAD (MISCELLANEOUS)
ELECT REM PT RETURN 9FT ADLT (ELECTROSURGICAL) ×4
ELECTRODE BLDE 4.0 EZ CLN MEGD (MISCELLANEOUS) IMPLANT
ELECTRODE REM PT RTRN 9FT ADLT (ELECTROSURGICAL) ×2 IMPLANT
EVACUATOR 1/8 PVC DRAIN (DRAIN) IMPLANT
GLOVE BIO SURGEON STRL SZ7 (GLOVE) ×4 IMPLANT
GLOVE BIO SURGEON STRL SZ7.5 (GLOVE) ×4 IMPLANT
GLOVE BIOGEL PI IND STRL 7.0 (GLOVE) ×2 IMPLANT
GLOVE BIOGEL PI IND STRL 8 (GLOVE) ×2 IMPLANT
GLOVE BIOGEL PI INDICATOR 7.0 (GLOVE) ×2
GLOVE BIOGEL PI INDICATOR 8 (GLOVE) ×2
GOWN STRL REUS W/ TWL LRG LVL3 (GOWN DISPOSABLE) ×2 IMPLANT
GOWN STRL REUS W/ TWL XL LVL3 (GOWN DISPOSABLE) ×2 IMPLANT
GOWN STRL REUS W/TWL LRG LVL3 (GOWN DISPOSABLE) ×4
GOWN STRL REUS W/TWL XL LVL3 (GOWN DISPOSABLE) ×4
HANDPIECE INTERPULSE COAX TIP (DISPOSABLE)
HEMOSTAT SURGICEL 2X14 (HEMOSTASIS) ×2 IMPLANT
HOOD PEEL AWAY FACE SHEILD DIS (HOOD) ×8 IMPLANT
KIT BASIN OR (CUSTOM PROCEDURE TRAY) ×4 IMPLANT
KIT ROOM TURNOVER OR (KITS) ×4 IMPLANT
MANIFOLD NEPTUNE II (INSTRUMENTS) ×4 IMPLANT
NDL HYPO 25GX1X1/2 BEV (NEEDLE) IMPLANT
NDL MAYO TROCAR (NEEDLE) ×2 IMPLANT
NEEDLE HYPO 25GX1X1/2 BEV (NEEDLE) ×4 IMPLANT
NEEDLE MAYO TROCAR (NEEDLE) ×4 IMPLANT
NS IRRIG 1000ML POUR BTL (IV SOLUTION) ×4 IMPLANT
PACK SHOULDER (CUSTOM PROCEDURE TRAY) ×4 IMPLANT
PAD ARMBOARD 7.5X6 YLW CONV (MISCELLANEOUS) ×8 IMPLANT
RETRIEVER SUT HEWSON (MISCELLANEOUS) ×4 IMPLANT
SET HNDPC FAN SPRY TIP SCT (DISPOSABLE) ×2 IMPLANT
SLING ARM FOAM STRAP LRG (SOFTGOODS) ×2 IMPLANT
SLING ARM IMMOBILIZER LRG (SOFTGOODS) ×4 IMPLANT
SLING ARM IMMOBILIZER MED (SOFTGOODS) IMPLANT
SMARTMIX MINI TOWER (MISCELLANEOUS) ×4
SPONGE LAP 18X18 X RAY DECT (DISPOSABLE) ×4 IMPLANT
SPONGE LAP 4X18 X RAY DECT (DISPOSABLE) IMPLANT
STRIP CLOSURE SKIN 1/2X4 (GAUZE/BANDAGES/DRESSINGS) ×3 IMPLANT
SUCTION FRAZIER TIP 10 FR DISP (SUCTIONS) ×4 IMPLANT
SUPPORT WRAP ARM LG (MISCELLANEOUS) ×4 IMPLANT
SUT ETHIBOND NAB CT1 #1 30IN (SUTURE) ×4 IMPLANT
SUT MNCRL AB 4-0 PS2 18 (SUTURE) ×4 IMPLANT
SUT SILK 2 0 TIES 17X18 (SUTURE)
SUT SILK 2-0 18XBRD TIE BLK (SUTURE) IMPLANT
SUT VIC AB 0 CTB1 27 (SUTURE) IMPLANT
SUT VIC AB 2-0 CT1 27 (SUTURE) ×4
SUT VIC AB 2-0 CT1 TAPERPNT 27 (SUTURE) ×2 IMPLANT
SYR CONTROL 10ML LL (SYRINGE) IMPLANT
TAPE FIBER 2MM 7IN #2 BLUE (SUTURE) ×12 IMPLANT
TOWEL OR 17X24 6PK STRL BLUE (TOWEL DISPOSABLE) ×4 IMPLANT
TOWEL OR 17X26 10 PK STRL BLUE (TOWEL DISPOSABLE) ×4 IMPLANT
TOWER SMARTMIX MINI (MISCELLANEOUS) ×2 IMPLANT

## 2014-05-23 NOTE — Plan of Care (Signed)
Problem: Consults Goal: Diagnosis - Shoulder Surgery Total Shoulder Arthroplasty     

## 2014-05-23 NOTE — H&P (Signed)
Jenna Murphy is an 81 y.o. female.   Chief Complaint: Left shoulder pain and dysfunction, left thumb pain HPI: 81 year old female with a long history of left shoulder pain which has been refractory conservative management with injection therapy, NSAIDs, activity modification. Also has associated left basilar thumb arthritis with recent exacerbation.  Past Medical History  Diagnosis Date  . Diabetes mellitus     not on insulin  . Hypertension   . Gallstone pancreatitis 02/2008  . Arthritis   . Polio     as child     Past Surgical History  Procedure Laterality Date  . Cholecystectomy      Family History  Problem Relation Age of Onset  . Heart attack Mother     Died of MI, dec at 3183   . Cirrhosis Father     alcohol induced    Social History:  reports that she quit smoking about 46 years ago. Her smoking use included Cigarettes. She has a 20 pack-year smoking history. She has quit using smokeless tobacco. She reports that she does not drink alcohol or use illicit drugs.  Allergies: No Known Allergies  Medications Prior to Admission  Medication Sig Dispense Refill  . Albiglutide 30 MG PEN Inject 30 mg into the skin once a week.    Marland Kitchen. atorvastatin (LIPITOR) 20 MG tablet Take 20 mg by mouth daily.    Marland Kitchen. CARTIA XT 120 MG 24 hr capsule Take 120 mg by mouth.   3  . glyBURIDE-metformin (GLUCOVANCE) 2.5-500 MG per tablet Take 1 tablet by mouth 2 (two) times daily with a meal.    . valsartan-hydrochlorothiazide (DIOVAN-HCT) 320-25 MG per tablet Take 1 tablet by mouth daily.   1    Results for orders placed or performed during the hospital encounter of 05/23/14 (from the past 48 hour(s))  Glucose, capillary     Status: Abnormal   Collection Time: 05/23/14  6:07 AM  Result Value Ref Range   Glucose-Capillary 156 (H) 70 - 99 mg/dL   Comment 1 Notify RN    Comment 2 Documented in Char    No results found.  Review of Systems  All other systems reviewed and are negative.   Blood  pressure 166/50, pulse 87, temperature 98.9 F (37.2 C), temperature source Oral, resp. rate 18. Physical Exam  Constitutional: She is oriented to person, place, and time. She appears well-developed and well-nourished.  HENT:  Head: Atraumatic.  Eyes: EOM are normal.  Cardiovascular: Intact distal pulses.   Respiratory: Effort normal.  Musculoskeletal:  Left shoulder pain with limited range of motion and crepitance. Left basilar thumb joint tenderness palpation and pain with grind test.  Neurological: She is alert and oriented to person, place, and time.  Skin: Skin is warm and dry.  Psychiatric: She has a normal mood and affect.     Assessment/Plan #1 left shoulder endstage primary arthritis: Plan left total shoulder arthroplasty versus reverse shoulder arthroplasty depending on the rotator cuff condition time of surgery #2 left thumb basilar arthritis: Plan intra-articular corticosteroid injection to be given at the time of shoulder surgery Risks / benefits of surgery discussed Consent on chart  NPO for OR Preop antibiotics   Trigo Winterbottom WILLIAM 05/23/2014, 7:12 AM

## 2014-05-23 NOTE — Transfer of Care (Signed)
Immediate Anesthesia Transfer of Care Note  Patient: Jenna Murphy  Procedure(s) Performed: Procedure(s) with comments: STEROID INJECTION THUMB (Left) - Steroid injection left thumb REVERSE SHOULDER ARTHROPLASTY (Left)  Patient Location: PACU  Anesthesia Type:General and Regional  Level of Consciousness: awake, alert , oriented and sedated  Airway & Oxygen Therapy: Patient Spontanous Breathing  Post-op Assessment: Report given to RN, Post -op Vital signs reviewed and stable and Patient moving all extremities  Post vital signs: Reviewed and stable  Last Vitals:  Filed Vitals:   05/23/14 0602  BP: 166/50  Pulse: 87  Temp: 37.2 C  Resp: 18    Complications: No apparent anesthesia complications

## 2014-05-23 NOTE — Anesthesia Procedure Notes (Addendum)
Anesthesia Regional Block:  Interscalene brachial plexus block  Pre-Anesthetic Checklist: ,, timeout performed, Correct Patient, Correct Site, Correct Laterality, Correct Procedure, Correct Position, site marked, Risks and benefits discussed,  Surgical consent,  Pre-op evaluation,  At surgeon's request and post-op pain management  Laterality: Upper and Left  Prep: chloraprep       Needles:  Injection technique: Single-shot  Needle Type: Echogenic Stimulator Needle          Additional Needles:  Procedures: ultrasound guided (picture in chart) and nerve stimulator Interscalene brachial plexus block  Nerve Stimulator or Paresthesia:  Response: deltoid, 0.5 mA,   Additional Responses:   Narrative:  Injection made incrementally with aspirations every 5 mL.  Performed by: Personally  Anesthesiologist: MOSER, CHRIS  Additional Notes: H+P and labs reviewed, risks and benefits discussed with patient, procedure tolerated well without complications   Procedure Name: Intubation Date/Time: 05/23/2014 7:50 AM Performed by: Donette LarryMEYER, MARY E Pre-anesthesia Checklist: Patient identified, Emergency Drugs available, Suction available, Patient being monitored and Timeout performed Patient Re-evaluated:Patient Re-evaluated prior to inductionOxygen Delivery Method: Circle system utilized Preoxygenation: Pre-oxygenation with 100% oxygen Intubation Type: IV induction Ventilation: Mask ventilation without difficulty Laryngoscope Size: Mac and 3 Grade View: Grade I Tube type: Oral Tube size: 7.5 mm Number of attempts: 1 Airway Equipment and Method: Stylet Placement Confirmation: ETT inserted through vocal cords under direct vision,  positive ETCO2 and breath sounds checked- equal and bilateral Secured at: 23 cm Tube secured with: Tape Dental Injury: Teeth and Oropharynx as per pre-operative assessment     Anesthesia Regional Block:  Interscalene brachial plexus block  Pre-Anesthetic  Checklist: ,, timeout performed, Correct Patient, Correct Site, Correct Laterality, Correct Procedure, Correct Position, site marked, Risks and benefits discussed,  Surgical consent,  Pre-op evaluation,  At surgeon's request and post-op pain management  Laterality: Left  Prep: chloraprep       Needles:  Injection technique: Single-shot  Needle Type: Stimiplex     Needle Length: 4cm 4 cm Needle Gauge: 22 and 22 G    Additional Needles:  Procedures: ultrasound guided (picture in chart) Interscalene brachial plexus block Narrative:  Injection made incrementally with aspirations every 5 mL.  Performed by: Personally  Anesthesiologist: Lewie LoronGERMEROTH, Nastassia Bazaldua R  Additional Notes: Patient tolerated the procedure well without complications

## 2014-05-23 NOTE — Op Note (Signed)
Procedure(s): STEROID INJECTION THUMB REVERSE SHOULDER ARTHROPLASTY Procedure Note  Renee RivalJean D Youse female 81 y.o. 05/23/2014  Procedure(s) and Anesthesia Type:     * LEFT REVERSE SHOULDER ARTHROPLASTY - General    * STEROID INJECTION LEFT THUMB - Choice       Indications:  81 y.o. female  With endstage left shoulder arthritis with presumed rotator cuff tear. Pain and dysfunction interfered with quality of life and nonoperative treatment with activity modification, NSAIDS and injections failed. She was indicated for shoulder replacement with anatomic versus reverse total shoulder pending tear cuff findings of the time of surgery. She also had left basilar thumb arthritis which was significantly symptomatic and not responding to NSAIDs and rest. She wished to have an injection in her thumb at the time of her shoulder surgery.     Surgeon: Mable ParisHANDLER,Ayren Zumbro WILLIAM   Assistants: Damita Lackanielle Lalibert PA-C [facility](Danielle was present and scrubbed throughout the procedure and was essential in positioning, retraction, exposure, and closure)  Anesthesia: General endotracheal anesthesia with preoperative interscalene block given by the attending anesthesiologist    Procedure Detail  STEROID INJECTION THUMB, REVERSE SHOULDER ARTHROPLASTY   Estimated Blood Loss:  200 mL         Drains: none  Blood Given: none          Specimens: none        Complications:  * No complications entered in OR log *         Disposition: PACU - hemodynamically stable.         Condition: stable      OPERATIVE FINDINGS:  A Tornier Flex pressfit reverse total shoulder arthroplasty was placed with a  size 5 stem, a 36 centered glenosphere, and a +6-mm poly insert. The base plate  fixation was excellent with a screw in component.  PROCEDURE: The patient was identified in the preoperative holding area  where I personally marked the operative site after verifying site, side,  and procedure with the patient. An  interscalene block given by  the attending anesthesiologist in the holding area and the patient was taken back to the operating room where all extremities were  carefully padded in position after general anesthesia was induced. Prior to positioning and prepping the left thumb basilar joint was prepped with an alcohol wipe and a 22-gauge needle was used to inject the basilar joint with slight traction applied on the thumb with 1 mL Kenalog and 1 mL quarter percent Marcaine without epinephrine. She  was placed in a beach-chair position and the operative upper extremity was  prepped and draped in a standard sterile fashion. An approximately 10-  cm incision was made from the tip of the coracoid process to the center  point of the humerus at the level of the axilla. Dissection was carried  down through subcutaneous tissues to the level of the cephalic vein  which was taken laterally with the deltoid. The pectoralis major was  retracted medially. The subdeltoid space was developed and the lateral  edge of the conjoined tendon was identified. The undersurface of  conjoined tendon was palpated and the musculocutaneous nerve was not in  the field. Retractor was placed underneath the conjoined and second  retractor was placed lateral into the deltoid. The circumflex humeral  artery and vessels were identified and clamped and coagulated. The  biceps tendon was identified.  It was severely splayed and was transected at the superior labrum. It was then cut at the upper border of the pectoralis major  and allowed to retract. The subscapularis was intact and was taken down as a peel.  The  joint was then gently externally rotated while the capsule was released  from the humeral neck around to just beyond the 6 o'clock position. At  this point, the joint was dislocated and the humeral head was presented  into the wound. The excessive osteophyte formation was removed with a  large rongeur.  The cutting guide was  used to make the appropriate  head cut and the head was saved for potentially bone grafting.  The glenoid was exposed with the arm in an  abducted extended position. The anterior and posterior labrum were  completely excised and the capsule was released circumferentially to  allow for exposure of the glenoid for preparation. The guidepin was  placed using the guide in neutral angulation and the reamer was  placed over the guidepin to ream down to concentric surface. Superior  hand reamer was used as well. The central drill hole was then made after measuring for the central threaded peg. The Metaglene was then screwed in with excellent purchase. The superior and inferior screws were then drilled, measured, and filled with appropriate-sized locking screws. Fixation was excellent.  The Glenosphere was then impacted and screwed in place. The humerus was then again exposed and the entry all was used followed by sequential sounding reamers. The broaches were then used to broach up to size 5, which was left in place as a trial. Calcar planer was used.  The trial tray was then placed and the poly trials were tested and the above implant was felt to be the most appropriate soft tissue tensioning with excellent stability and  excellent range of motion. Therefore, final humeral stem was placed press-fit.  And then the trial polyethylene inserts were tested again and the above implant was felt to be the most appropriate for final insertion. The joint was reduced taken through full range of motion and felt to be stable. Soft tissue tension was appropriate.  The joint was then copiously irrigated with pulse  lavage and the wound was then closed. The subscapularis was repaired with 2 fiber tapes through bone tunnels.  Skin was closed with 2-0 Vicryl in a deep dermal layer and 4-0  Monocryl for skin closure. Steri-Strips were applied. Sterile  dressings were then applied as well as a sling. The patient was allowed  to  awaken from general anesthesia, transferred to stretcher, and taken  to recovery room in stable condition.   POSTOPERATIVE PLAN: The patient will be kept in the hospital postoperatively  for pain control and therapy.

## 2014-05-23 NOTE — Progress Notes (Signed)
Report to Diane RN for lunch

## 2014-05-23 NOTE — Progress Notes (Signed)
Utilization review completed.  

## 2014-05-23 NOTE — Discharge Instructions (Signed)
Discharge Instructions after Reverse Total Shoulder Arthroplasty   A sling has been provided for you. You are to where this at all times, even while sleeping, until your first post operative visit with Dr. Ave Filterhandler. Use ice on the shoulder intermittently over the first 48 hours after surgery.  Pain medicine has been prescribed for you.  Use your medicine liberally over the first 48 hours, and then you can begin to taper your use. You may take Extra Strength Tylenol or Tylenol only in place of the pain pills. DO NOT take ANY nonsteroidal anti-inflammatory pain medications: Advil, Motrin, Ibuprofen, Aleve, Naproxen or Naprosyn.  Take one aspirin a day for 2 weeks after surgery, unless you have an aspirin sensitivity/allergy or asthma.  The dressing is waterproof, you can shower with it. Leave it on until your first visit with Dr. Ave Filterhandler 2 weeks after surgery. Just hold your arm by your side like you are in the sling.  Make sure your axilla (armpit) is completely dry after showering.    Please call [phone] during normal business hours or [phone] after hours for any problems. Including the following:  - excessive redness of the incisions - drainage for more than 4 days - fever of more than 101.5 F  *Please note that pain medications will not be refilled after hours or on weekends.

## 2014-05-23 NOTE — Progress Notes (Signed)
Dr germeroth here at bedside performed block into left shoulder pt tolerated well

## 2014-05-23 NOTE — Anesthesia Postprocedure Evaluation (Signed)
Anesthesia Post Note  Patient: Jenna Murphy  Procedure(s) Performed: Procedure(s) (LRB): STEROID INJECTION THUMB (Left) REVERSE SHOULDER ARTHROPLASTY (Left)  Anesthesia type: General  Patient location: PACU  Post pain: Pain level controlled  Post assessment: Post-op Vital signs reviewed  Last Vitals: BP 160/76 mmHg  Pulse 67  Temp(Src) 36.1 C (Oral)  Resp 22  SpO2 100%  Post vital signs: Reviewed  Level of consciousness: sedated  Complications: No apparent anesthesia complications

## 2014-05-24 ENCOUNTER — Encounter (HOSPITAL_COMMUNITY): Payer: Self-pay | Admitting: Orthopedic Surgery

## 2014-05-24 LAB — BASIC METABOLIC PANEL
ANION GAP: 5 (ref 5–15)
BUN: 14 mg/dL (ref 6–23)
CALCIUM: 8.3 mg/dL — AB (ref 8.4–10.5)
CO2: 28 mmol/L (ref 19–32)
Chloride: 104 mmol/L (ref 96–112)
Creatinine, Ser: 1 mg/dL (ref 0.50–1.10)
GFR, EST AFRICAN AMERICAN: 58 mL/min — AB (ref >90)
GFR, EST NON AFRICAN AMERICAN: 50 mL/min — AB (ref >90)
Glucose, Bld: 133 mg/dL — ABNORMAL HIGH (ref 70–99)
POTASSIUM: 3.9 mmol/L (ref 3.5–5.1)
SODIUM: 137 mmol/L (ref 135–145)

## 2014-05-24 LAB — CBC
HEMATOCRIT: 33.8 % — AB (ref 36.0–46.0)
Hemoglobin: 10.6 g/dL — ABNORMAL LOW (ref 12.0–15.0)
MCH: 25.3 pg — ABNORMAL LOW (ref 26.0–34.0)
MCHC: 31.4 g/dL (ref 30.0–36.0)
MCV: 80.7 fL (ref 78.0–100.0)
PLATELETS: 239 10*3/uL (ref 150–400)
RBC: 4.19 MIL/uL (ref 3.87–5.11)
RDW: 14.9 % (ref 11.5–15.5)
WBC: 8.3 10*3/uL (ref 4.0–10.5)

## 2014-05-24 LAB — GLUCOSE, CAPILLARY
Glucose-Capillary: 134 mg/dL — ABNORMAL HIGH (ref 70–99)
Glucose-Capillary: 145 mg/dL — ABNORMAL HIGH (ref 70–99)
Glucose-Capillary: 178 mg/dL — ABNORMAL HIGH (ref 70–99)

## 2014-05-24 NOTE — Evaluation (Signed)
Physical Therapy Evaluation Patient Details Name: Jenna Murphy MRN: [id number] DOB: [birth year] Today's Date: 05/24/2014   History of Present Illness  Pt is an 81 y.o. Female with PMH HTN, DM, arthritis, childhood polio who is s/p Left Reverse TSA and steroid injection to left thumb.  Clinical Impression  Pt. Presents to PT s/p left reverse TSA, balance deficits in functional mobility and gait, as well as below problems.  Pt. Will benefit from acute PT to address these issues to facilitate safe return home with family support.      Follow Up Recommendations Home health PT;Supervision/Assistance - 24 hour (HHPT unless more steady tomorrow)    Equipment Recommendations  Other (comment) (TBD; pt. may benefit from quad cane vs hemiwalker)    Recommendations for Other Services       Precautions / Restrictions Precautions Precautions: Fall;Shoulder Type of Shoulder Precautions: Chandler protocol: elbow, wrist, hand AROM, no shoulder movement Shoulder Interventions: Shoulder sling/immobilizer;At all times;Off for dressing/bathing/exercises Restrictions Weight Bearing Restrictions: Yes LUE Weight Bearing: Non weight bearing      Mobility  Bed Mobility Overal bed mobility:  (not tested, pt in recliner with family present)                Transfers Overall transfer level: Needs assistance Equipment used: 1 person hand held assist Transfers: Sit to/from Stand Sit to Stand: Min assist         General transfer comment: min power up assist needed; min assist for stability in standing prior to beginning to walk  Ambulation/Gait Ambulation/Gait assistance: Min assist Ambulation Distance (Feet): 75 Feet ( x 2 bouts) Assistive device: Straight cane Gait Pattern/deviations: Step-through pattern;Narrow base of support;Decreased stride length Gait velocity: decreased   General Gait Details: Pt. needed close minimum assist for stability while walking.  Somewhat unsteady even with  use of single point cane (uses at baseline)  Information systems manager [employer] (Stroke Patients Only)       Balance Overall balance assessment: Needs assistance Sitting-balance support: No upper extremity supported;Feet supported Sitting balance-Leahy Scale: Good     Standing balance support: Single extremity supported;During functional activity Standing balance-Leahy Scale: Poor Standing balance comment: needs support from cane and therapist for stability                             Pertinent Vitals/Pain Pain Assessment: 0-10 Pain Score: 4  Pain Location: left shoulder Pain Descriptors / Indicators: Aching Pain Intervention(s): Limited activity within patient's tolerance;Monitored during session;Premedicated before session;Repositioned    Home Living Family/patient expects to be discharged to:: Private residence Living Arrangements: Alone Available Help at Discharge: Family;Available 24 hours/day (daughter from [place] to stay with pt. for 2 weeks) Type of Home: Apartment Home Access: Level entry;Other (comment) (curb form parking lot)     Home Layout: One level Home Equipment: Cane - single point;Shower seat      Prior Function Level of Independence: Independent with assistive device(s)         Comments: pt ambulates with SPC     Hand Dominance        Extremity/Trunk Assessment   Upper Extremity Assessment: Defer to OT evaluation           Lower Extremity Assessment: Overall WFL for tasks assessed      Cervical / Trunk Assessment: Normal  Communication   Communication: No difficulties  Cognition Arousal/Alertness: Awake/alert Behavior During Therapy: WFL for tasks assessed/performed Overall Cognitive Status: Within Functional Limits for tasks assessed                      General Comments      Exercises        Assessment/Plan    PT Assessment Patient needs continued PT services  PT  Diagnosis Difficulty walking;Acute pain   PT Problem List Decreased activity tolerance;Decreased balance;Decreased mobility;Decreased knowledge of use of DME;Decreased knowledge of precautions;Pain  PT Treatment Interventions DME instruction;Gait training;Stair training;Functional mobility training;Balance training;Patient/family education   PT Goals (Current goals can be found in the Care Plan section) Acute Rehab PT Goals Patient Stated Goal: to have my daughter help at home PT Goal Formulation: With patient Time For Goal Achievement: 05/31/14 Potential to Achieve Goals: Good    Frequency Min 5X/week   Barriers to discharge        Co-evaluation               End of Session Equipment Utilized During Treatment: Gait belt;Other (comment) (left UE sling) Activity Tolerance: Patient tolerated treatment well Patient left: in chair;with call bell/phone within reach;with family/visitor present Nurse Communication: Mobility status         Time: 9811-91471552-1605 PT Time Calculation (min) (ACUTE ONLY): 13 min   Charges:   PT Evaluation $Initial PT Evaluation Tier I: 1 Procedure     PT G CodesFerman Hamming:        Lillion Elbert B 05/24/2014, 4:21 PM Weldon PickingSusan Ananias Kolander PT Acute Rehab Services [phone] Beeper [phone]

## 2014-05-24 NOTE — Progress Notes (Signed)
   PATIENT ID: Jenna Murphy   1 Day Post-Op Procedure(s) (LRB): STEROID INJECTION THUMB (Left) REVERSE SHOULDER ARTHROPLASTY (Left)  Subjective: Sitting up eating breakfast this am. Is anxious and says she is dizzy. Minimal pain overnight but now has left shoulder pain.   Objective:  Filed Vitals:   05/24/14 0634  BP: 134/64  Pulse: 60  Temp: 97.6 F (36.4 C)  Resp: 16     Awake, alert, orientated R UE dressing with dried blood, intact Sling Wiggles fingers, distally NVI Deltoid activates  Labs:   Recent Labs  05/24/14 0603  HGB 10.6*   Recent Labs  05/24/14 0603  WBC 8.3  RBC 4.19  HCT 33.8*  PLT 239   Recent Labs  05/24/14 0603  NA 137  K 3.9  CL 104  CO2 28  BUN 14  CREATININE 1.00  GLUCOSE 133*  CALCIUM 8.3*    Assessment and Plan: 1 day s/p left reverse TSA, thumb injection Continue sling She is anxious, having increased pain after block wore off, but orientated. Minimize pain medication as much as possible once caught up Continue to observe today, d/c to home when ready, today vs tomorrow OT today- hand, wrist, elbow only  VTE proph: ASA 325mg  BID, SCDs

## 2014-05-24 NOTE — Evaluation (Signed)
Occupational Therapy Evaluation Patient Details Name: Jenna Murphy MRN: [id number] DOB: 08-Nov-1927 Today's Date: 05/24/2014    History of Present Illness Pt is an 81 y.o. Female with PMH HTN, DM, arthritis, childhood polio who is s/p Left Reverse TSA and steroid injection to left thumb.   Clinical Impression   PTA pt lived at home and was independent with ADLs with use of SPC. Pt is currently limited by pain, decreased balance, and decreased functional use of LUE and requires assist for UB and LB ADLs and requires Mod A for transfers. Pt c/o "swimmyheadedness" when up and has posterior lean in standing, possibly due to medications. Feel that pt could benefit from additional night to ensure safety with functional mobility at home. Pt will have 24/7 assist from daughter at d/c. Recommending PT consult.     Follow Up Recommendations  No OT follow up;Supervision/Assistance - 24 hour    Equipment Recommendations  None recommended by OT    Recommendations for Other Services  PT consult     Precautions / Restrictions Precautions Precautions: Fall;Shoulder Type of Shoulder Precautions: Chandler protocol: elbow, wrist, hand AROM, no shoulder movement Shoulder Interventions: Shoulder sling/immobilizer;At all times;Off for dressing/bathing/exercises Precaution Booklet Issued: Yes (comment) Precaution Comments: Educated pt on shoulder precautions and incorporating into ADLs .  Restrictions Weight Bearing Restrictions: Yes LUE Weight Bearing: Non weight bearing      Mobility Bed Mobility               General bed mobility comments: Pt sitting EOB with NT when OT arrived. Discussed possibly sleeping in recliner due to difficulty with bed mobility and pt reports that she plans to do this.   Transfers Overall transfer level: Needs assistance Equipment used: 1 person hand held assist Transfers: Sit to/from [employer] Sit to Stand: Mod assist Stand pivot  transfers: Mod assist       General transfer comment: Pt has posterior lean possibly due to c/o "swimmy headed" and medications. Pt uses SPC at baseline.          ADL Overall ADL's : Needs assistance/impaired Eating/Feeding: Set up;Sitting Eating/Feeding Details (indicate cue type and reason): pt requires assist for cutting food and opening containers Grooming: Set up;Sitting   Upper Body Bathing: Minimal assitance;Sitting   Lower Body Bathing: Maximal assistance;Sit to/from stand   Upper Body Dressing : Maximal assistance;Sitting   Lower Body Dressing: Sit to/from stand;Total assistance   Toilet Transfer: Moderate assistance;Stand-pivot;BSC Toilet Transfer Details (indicate cue type and reason): pt performed SPT from bed>BSC>recliner. Pt with balance deficits at baseline and uses cane, however has posterior lean possibly due to medications.  Toileting- Clothing Manipulation and Hygiene: Total assistance;Sit to/from stand Toileting - Clothing Manipulation Details (indicate cue type and reason): Pt requires UE support to maintain balance in standing and is unable to perform pericare        General ADL Comments: Pt presents with reverse TSA left shoulder and increased balance deficits, likely due to medication. Pt reports feeling "swimmy headed."     Vision  Pt reports no change from baseline.           Pertinent Vitals/Pain Pain Assessment: 0-10 Pain Score: 7  Pain Location: Left shoulder with exercises (elbow wrist hand) Pain Descriptors / Indicators: Aching;Sharp;Shooting Pain Intervention(s): Limited activity within patient's tolerance;Monitored during session;Repositioned;Ice applied     Hand Dominance Right   Extremity/Trunk Assessment Upper Extremity Assessment Upper Extremity Assessment: LUE deficits/detail LUE Deficits / Details: L shoulder reverse TSA; no  shoulder ROM; elbow, wrist, and hand only, mild edema in hand LUE: Unable to fully assess due to  pain;Unable to fully assess due to immobilization LUE Coordination: decreased fine motor;decreased gross motor   Lower Extremity Assessment Lower Extremity Assessment: Overall WFL for tasks assessed   Cervical / Trunk Assessment Cervical / Trunk Assessment: Normal   Communication Communication Communication: No difficulties   Cognition Arousal/Alertness: Awake/alert;Lethargic;Suspect due to medications Behavior During Therapy: WFL for tasks assessed/performed Overall Cognitive Status: Within Functional Limits for tasks assessed                     General Comments       Exercises Exercises: Shoulder     Shoulder Instructions Shoulder Instructions Donning/doffing shirt without moving shoulder: Maximal assistance Method for sponge bathing under operated UE: Minimal assistance Donning/doffing sling/immobilizer: Maximal assistance Correct positioning of sling/immobilizer: Maximal assistance ROM for elbow, wrist and digits of operated UE: Minimal assistance Sling wearing schedule (on at all times/off for ADL's): Independent Proper positioning of operated UE when showering: Independent Positioning of UE while sleeping: Independent    Home Living Family/patient expects to be discharged to:: Private residence Living Arrangements: Alone Available Help at Discharge: Family;Available 24 hours/day (daughter will be staying with pt) Type of Home: Apartment Home Access: Level entry;Other (comment) (curb from parking lot)     Home Layout: One level     Bathroom Shower/Tub: Tub/shower unit Shower/tub characteristics: Curtain FirefighterBathroom Toilet: Standard     Home Equipment: Cane - single point;Shower seat          Prior Functioning/Environment Level of Independence: Independent with assistive device(s)        Comments: pt ambulates with SPC    OT Diagnosis: Generalized weakness;Acute pain   OT Problem List: Decreased strength;Decreased range of motion;Decreased  activity tolerance;Impaired balance (sitting and/or standing);Decreased knowledge of use of DME or AE;Decreased knowledge of precautions;Impaired UE functional use;Pain   OT Treatment/Interventions: Self-care/ADL training;Therapeutic exercise;Energy conservation;DME and/or AE instruction;Therapeutic activities;Patient/family education;Balance training    OT Goals(Current goals can be found in the care plan section) Acute Rehab OT Goals Patient Stated Goal: to have my daughter help at home OT Goal Formulation: With patient Time For Goal Achievement: 06/07/14 Potential to Achieve Goals: Good ADL Goals Pt Will Perform Upper Body Bathing: with caregiver independent in assisting;sitting Pt Will Perform Upper Body Dressing: with caregiver independent in assisting;sitting Pt Will Transfer to Toilet: with supervision;stand pivot transfer;bedside commode Pt/caregiver will Perform Home Exercise Program: Increased ROM;Left upper extremity;With Supervision;With written HEP provided Additional ADL Goal #1: Caregiver will be independent with assisting pt to don/doff sling.   OT Frequency: Min 2X/week    End of Session Equipment Utilized During Treatment: Other (comment) (sling) Nurse Communication: Mobility status;Other (comment) (IV came out during session)  Activity Tolerance: Patient tolerated treatment well Patient left: in chair;with call bell/phone within reach   Time: [id number] OT Time Calculation (min): 40 min Charges:  OT General Charges $OT Visit: 1 Procedure OT Evaluation $Initial OT Evaluation Tier I: 1 Procedure OT Treatments $Self Care/Home Management : 8-22 mins $Therapeutic Exercise: 8-22 mins G-Codes:    Rae LipsMiller, Callista Hoh M 05/24/2014, 9:39 AM  Carney LivingLeeAnn Marie Haifa Hatton, OTR/L Occupational Therapist [phone] (pager)

## 2014-05-25 ENCOUNTER — Inpatient Hospital Stay (HOSPITAL_COMMUNITY): Payer: Medicare Other

## 2014-05-25 LAB — GLUCOSE, CAPILLARY
GLUCOSE-CAPILLARY: 120 mg/dL — AB (ref 70–99)
Glucose-Capillary: 184 mg/dL — ABNORMAL HIGH (ref 70–99)
Glucose-Capillary: 86 mg/dL (ref 70–99)

## 2014-05-25 MED ORDER — WHITE PETROLATUM GEL
Status: AC
  Administered 2014-05-25: 08:00:00
  Filled 2014-05-25: qty 1

## 2014-05-25 NOTE — Progress Notes (Signed)
Physical Therapy Treatment Patient Details Name: Jenna Murphy MRN: [id number] DOB: 04/07/1928 Today's Date: 05/25/2014    History of Present Illness Pt is an 81 y.o. Female with PMH HTN, DM, arthritis, childhood polio who is s/p Left Reverse TSA and steroid injection to left thumb.    PT Comments    Patient progressing well and is motivated. Adjusted sling in sitting as it was not on properly. Educated patient and family regarding sling placement and wear. Patient able to ambulate with cane with minimal stagger. Family present at end of session and they have also walked with the patient.   Follow Up Recommendations  Home health PT;Supervision/Assistance - 24 hour     Equipment Recommendations       Recommendations for Other Services       Precautions / Restrictions Precautions Precautions: Fall;Shoulder Type of Shoulder Precautions: Chandler protocol: elbow, wrist, hand AROM, no shoulder movement Shoulder Interventions: Shoulder sling/immobilizer;At all times;Off for dressing/bathing/exercises Precaution Comments: Educated pt on shoulder precautions  Restrictions Weight Bearing Restrictions: Yes LUE Weight Bearing: Non weight bearing    Mobility  Bed Mobility                  Transfers Overall transfer level: Needs assistance   Transfers: Sit to/from Stand Sit to Stand: Min assist         General transfer comment: min power up assist needed; min assist for stability in standing prior to beginning to walk  Ambulation/Gait Ambulation/Gait assistance: Min guard Ambulation Distance (Feet): 100 Feet Assistive device: Straight cane Gait Pattern/deviations: Step-through pattern;Decreased stride length Gait velocity: decreased   General Gait Details: Pt. needed MG for stability while walking.  Somewhat unsteady even with use of single point cane (uses at baseline)   Information systems manager [employer] (Stroke Patients Only)        Balance                                    Cognition Arousal/Alertness: Awake/alert Behavior During Therapy: WFL for tasks assessed/performed Overall Cognitive Status: Within Functional Limits for tasks assessed                      Exercises      General Comments        Pertinent Vitals/Pain Pain Score: 8  Pain Location: [place] Pain Descriptors / Indicators: Crying Pain Intervention(s): Limited activity within patient's tolerance;Premedicated before session    Home Living                      Prior Function            PT Goals (current goals can now be found in the care plan section) Progress towards PT goals: Progressing toward goals    Frequency  Min 5X/week    PT Plan Current plan remains appropriate    Co-evaluation             End of Session   Activity Tolerance: Patient tolerated treatment well Patient left: in chair;with call bell/phone within reach;with family/visitor present     Time: 4098-1191 PT Time Calculation (min) (ACUTE ONLY): 25 min  Charges:  $Gait Training: 8-22 mins $Therapeutic Activity: 8-22 mins  G Codes:      Fredrich BirksRobinette, Marabeth Melland Elizabeth 05/25/2014, 9:11 AM  05/25/2014 Fredrich Birksobinette, Emir Nack Elizabeth PTA [phone] pager [phone] office

## 2014-05-25 NOTE — Progress Notes (Signed)
Occupational Therapy Treatment Patient Details Name: KIRSTAN FENTRESS MRN: [id number] DOB: [birth year] Today's Date: 05/25/2014    History of present illness Pt is an 81 y.o. Female with PMH HTN, DM, arthritis, childhood polio who is s/p Left Reverse TSA and steroid injection to left thumb.   OT comments  Pt. C/o significant pain in L shoulder and arm. "i didn't think it was going to be like this".  Educated and encouraged importance of ROM throughout the day for edema management and increasing activity tolerance with LUE.  Dtr. And son present for session. Provided education and demonstration of all recommended digit, wrist, and elbow rom.  Able to return demo and verbalized understanding. Reviewed technique for don/doff sling.  ub dressing review and toileting planned for next session.    Follow Up Recommendations  No OT follow up;Supervision/Assistance - 24 hour    Equipment Recommendations  None recommended by OT    Recommendations for Other Services      Precautions / Restrictions Precautions Precautions: Fall;Shoulder Type of Shoulder Precautions: Chandler protocol: elbow, wrist, hand AROM, no shoulder movement Shoulder Interventions: Shoulder sling/immobilizer;At all times;Off for dressing/bathing/exercises Precaution Comments: Educated pt on shoulder precautions  Restrictions Weight Bearing Restrictions: Yes LUE Weight Bearing: Non weight bearing       Mobility Bed Mobility                  Transfers Overall transfer level: Needs assistance   Transfers: Sit to/from Stand Sit to Stand: Min assist         General transfer comment: min power up assist needed; min assist for stability in standing prior to beginning to walk    Balance                                   ADL                                                Vision                     Perception     Praxis      Cognition   Behavior During  Therapy: [facility] for tasks assessed/performed Overall Cognitive Status: Within Functional Limits for tasks assessed                       Extremity/Trunk Assessment               Exercises Shoulder Exercises Elbow Flexion: AAROM;Left;10 reps;Seated Elbow Extension: AAROM;Left;10 reps;Seated Wrist Flexion: AAROM;Left;10 reps;Seated Wrist Extension: AAROM;Left;10 reps;Seated Digit Composite Flexion: AROM;Left;10 reps;Seated Composite Extension: AROM;Left;10 reps;Seated Donning/doffing sling/immobilizer: Maximal assistance Correct positioning of sling/immobilizer: Maximal assistance ROM for elbow, wrist and digits of operated UE: Minimal assistance Sling wearing schedule (on at all times/off for ADL's): Independent   Shoulder Instructions Shoulder Instructions Donning/doffing sling/immobilizer: Maximal assistance Correct positioning of sling/immobilizer: Maximal assistance ROM for elbow, wrist and digits of operated UE: Minimal assistance Sling wearing schedule (on at all times/off for ADL's): Independent     General Comments      Pertinent Vitals/ Pain       Pain Score: 8  Pain Location: [place] Pain Descriptors / Indicators: Crying Pain Intervention(s): Limited activity within patient's tolerance;Premedicated before session  Home  Living                                          Prior Functioning/Environment              Frequency Min 2X/week     Progress Toward Goals  OT Goals(current goals can now be found in the care plan section)  Progress towards OT goals: Progressing toward goals     Plan Discharge plan remains appropriate    Co-evaluation                 End of Session     Activity Tolerance Patient tolerated treatment well   Patient Left in chair;with call bell/phone within reach;with family/visitor present   Nurse Communication          Time: [id number] OT Time Calculation (min): 13 min  Charges: OT  General Charges $OT Visit: 1 Procedure OT Treatments $Therapeutic Exercise: 8-22 mins  Robet LeuMorris, Kimie Pidcock [place], COTA/L 05/25/2014, 9:37 AM

## 2014-05-25 NOTE — Progress Notes (Signed)
Subjective: 2 Days Post-Op Procedure(s) (LRB): STEROID INJECTION THUMB (Left) REVERSE SHOULDER ARTHROPLASTY (Left) Patient reports pain as 8 on 0-10 scale.  The patient complains of severe left shoulder pain since 6 this a.m. She is taking by mouth and voiding okay. She had been doing very well related to shoulder pain. No other complaints  Objective: Vital signs in last 24 hours: Temp:  [97.7 F (36.5 C)-98.8 F (37.1 C)] 98.6 F (37 C) (02/13 0630) Pulse Rate:  [75-96] 96 (02/13 0630) Resp:  [18] 18 (02/13 0630) BP: (114-154)/(58-74) 154/74 mmHg (02/13 0630) SpO2:  [97 %-98 %] 97 % (02/13 0630)  Intake/Output from previous day: 02/12 0701 - 02/13 0700 In: 720 [P.O.:720] Out: -  Intake/Output this shift:     Recent Labs  05/24/14 0603  HGB 10.6*    Recent Labs  05/24/14 0603  WBC 8.3  RBC 4.19  HCT 33.8*  PLT 239    Recent Labs  05/24/14 0603  NA 137  K 3.9  CL 104  CO2 28  BUN 14  CREATININE 1.00  GLUCOSE 133*  CALCIUM 8.3*   No results for input(s): LABPT, INR in the last 72 hours. Left shoulder exam: Her dressing is benign. She has significant pain with any attempts at range of motion of the left shoulder. Good sensation and motor strength distally. She is very hesitant to move her arm. No obvious deformity at the shoulder.   Assessment/Plan: 2 Days Post-Op Procedure(s) (LRB): STEROID INJECTION THUMB (Left) REVERSE SHOULDER ARTHROPLASTY (Left) Plan: I will order an x-ray of the left shoulder to make sure that there is no dislocation etc. If the x-ray is negative I will discharge her home. She will follow-up with Dr. Ave Filterhandler in 10-14 days.   Jenna Murphy G 05/25/2014, 8:32 AM

## 2014-06-17 NOTE — Discharge Summary (Signed)
Patient ID: Jenna Murphy MRN: [id number] DOB/AGE: [birth year] 79 y.o.  Admit date: 05/23/2014 Discharge date: 05/25/2014  Admission Diagnoses:  Principal Problem:   S/p reverse total shoulder arthroplasty   Discharge Diagnoses:  Same  Past Medical History  Diagnosis Date  . Diabetes mellitus     not on insulin  . Hypertension   . Gallstone pancreatitis 02/2008  . Arthritis   . Polio     as child     Surgeries: Procedure(s): STEROID INJECTION THUMB REVERSE SHOULDER ARTHROPLASTY on 05/23/2014   Consultants:    Discharged Condition: Improved  Hospital Course: Jenna RivalJean D Fontanez is an 81 y.o. female who was admitted 05/23/2014 for operative treatment of end stage glenohumeral arthritis, rotator cuff tear. Also has basilar thumb joint arthritis with increased pain refractory to NSAIDs. Patient has severe unremitting pain that affects sleep, daily activities, and work/hobbies. After pre-op clearance the patient was taken to the operating room on 05/23/2014 and underwent  Procedure(s): STEROID INJECTION THUMB REVERSE SHOULDER ARTHROPLASTY.    Patient was given perioperative antibiotics:  Anti-infectives    Start     Dose/Rate Route Frequency Ordered Stop   05/23/14 1245  ceFAZolin (ANCEF) IVPB 1 g/50 mL premix     1 g 100 mL/hr over 30 Minutes Intravenous 4 times per day 05/23/14 1234 05/24/14 0559   05/23/14 0600  ceFAZolin (ANCEF) IVPB 2 g/50 mL premix     2 g 100 mL/hr over 30 Minutes Intravenous On call to O.R. 05/22/14 1316 05/23/14 0752       Patient was given sequential compression devices, early ambulation, and ASA 325mg  BID to prevent DVT.  Patient benefited maximally from hospital stay and there were no complications.    Recent vital signs: No data found.    Recent laboratory studies: No results for input(s): WBC, HGB, HCT, PLT, NA, K, CL, CO2, BUN, CREATININE, GLUCOSE, INR, CALCIUM in the last 72 hours.  Invalid input(s): PT, 2   Discharge Medications:      Medication List    TAKE these medications        Albiglutide 30 MG Pen  Inject 30 mg into the skin once a week.     atorvastatin 20 MG tablet  Commonly known as:  LIPITOR  Take 20 mg by mouth daily.     CARTIA XT 120 MG 24 hr capsule  Generic drug:  diltiazem  Take 120 mg by mouth.     docusate sodium 100 MG capsule  Commonly known as:  COLACE  Take 1 capsule (100 mg total) by mouth 3 (three) times daily as needed.     glyBURIDE-metformin 2.5-500 MG per tablet  Commonly known as:  GLUCOVANCE  Take 1 tablet by mouth 2 (two) times daily with a meal.     oxyCODONE-acetaminophen 5-325 MG per tablet  Commonly known as:  ROXICET  Take 1-2 tablets by mouth every 4 (four) hours as needed for severe pain.     valsartan-hydrochlorothiazide 320-25 MG per tablet  Commonly known as:  DIOVAN-HCT  Take 1 tablet by mouth daily.        Diagnostic Studies: Dg Shoulder Left Port  05/25/2014   CLINICAL DATA:  Left shoulder pain. History of reverse left shoulder arthroplasty.  EXAM: LEFT SHOULDER - 1 VIEW  COMPARISON:  05/23/2014  FINDINGS: Patient status post reverse left shoulder arthroplasty. No evidence for acute fracture. Unable to evaluate for location given single AP view.  IMPRESSION: Unable to evaluate for location given single AP view.  No definite evidence for fracture.  If persistent concern, recommend multiple plain film radiographic views of the shoulder.   Electronically Signed   By: Annia Belt M.D.   On: 05/25/2014 09:57   Dg Shoulder Left Port  05/23/2014   CLINICAL DATA:  Status post shoulder arthroplasty on the left  EXAM: LEFT SHOULDER - 1 VIEW  COMPARISON:  08/06/2012  FINDINGS: Humeral and glenoid components appear in anticipated position. Mild postoperative soft tissue change over the shoulder joint.  IMPRESSION: Anticipated postoperative appearance   Electronically Signed   By: Esperanza Heir M.D.   On: 05/23/2014 11:44    Disposition: 01-Home or Self Care       Discharge Instructions    Call MD / Call 911    Complete by:  As directed   If you experience chest pain or shortness of breath, CALL 911 and be transported to the hospital emergency room.  If you develope a fever above 101 F, pus (white drainage) or increased drainage or redness at the wound, or calf pain, call your surgeon's office.     Constipation Prevention    Complete by:  As directed   Drink plenty of fluids.  Prune juice may be helpful.  You may use a stool softener, such as Colace (over the counter) 100 mg twice a day.  Use MiraLax (over the counter) for constipation as needed.     Increase activity slowly as tolerated    Complete by:  As directed            Follow-up Information    Follow up with Mable Paris, MD. Schedule an appointment as soon as possible for a visit in 1 week.   Specialty:  Orthopedic Surgery   Contact information:   [address] [place] [address] [phone]        Signed: Jiles Harold 06/17/2014, 1:00 PM

## 2014-07-23 ENCOUNTER — Ambulatory Visit (INDEPENDENT_AMBULATORY_CARE_PROVIDER_SITE_OTHER): Payer: Medicare Other | Admitting: Family Medicine

## 2014-07-23 ENCOUNTER — Ambulatory Visit (INDEPENDENT_AMBULATORY_CARE_PROVIDER_SITE_OTHER): Payer: Medicare Other

## 2014-07-23 VITALS — BP 144/74 | HR 66 | Temp 98.3°F | Resp 20 | Ht 64.75 in | Wt 163.2 lb

## 2014-07-23 DIAGNOSIS — W19XXXA Unspecified fall, initial encounter: Secondary | ICD-10-CM | POA: Diagnosis not present

## 2014-07-23 DIAGNOSIS — M79602 Pain in left arm: Secondary | ICD-10-CM

## 2014-07-23 NOTE — Patient Instructions (Signed)
It does not look like you have any fracture of your arm- but you are certainly sore!  You can continue to use aleve or tylenol as needed for pain in your arm Let me know if your arm does not feel better in the next few days

## 2014-07-23 NOTE — Progress Notes (Signed)
Subjective:    Patient ID: Jenna Murphy, female    DOB: 01/15/1928, 81 y.o.   MRN: [id number]  HPI  This is a 81 y.o. female presenting with left shoulder pain after fall at son's house yesterday, when she feels she lost her footing due to rushing on a hardwood floor and fell towards her right, reaching right arm out. She denies hitting her head or LOC. Afterwards, her right side did not hurt but she noticed relatively severe left neck, shoulder, and arm pain. She had left shoulder surgery early February she thinks was for arthritis and states it had been doing well and now hurts like it did soon after surgery. She thinks range of motion is similar to how it was prior to fall. Denies any numbness or tingling.  Her left shoulder replacement was done by Jackquline [facility]Jesse [facility].    Last night she took an aleve which did help  Review of Systems Per HPI, denies other complaints     Objective:   Physical Exam BP 144/74 mmHg  Pulse 66  Temp(Src) 98.3 F (36.8 C) (Oral)  Resp 20  Ht 5' 4.75" (1.645 m)  Wt 163 lb 4 oz (74.05 kg)  BMI 27.36 kg/m2  SpO2 99%  GEN: NAD, pleasant Bilateral TM wnl, oropharynx normal.  PEERL,EOMI.   EXT: Left shoulder with no deformity, erythema, or swelling; well-healed linear scar Tenderness along trapezius and deltoid distributions as well as down posterior lateral left arm to elbow, with increased tone to left trapezius Point tenderness lower cervical spine, left mid-clavicle, left AC joint, left acromion, left elbow.  Normal ROM of elbow Full gross sensation down left arm Normal neck ROM, 110 degrees of left shoulder fwd flexion and abduction with pain at this point, pain with impingement testing / hawkins test Full left elbow ROM  [facility] reading (PRIMARY) by  Dr. Patsy Lageropland. Cervical spine: moderate degenerative change C6/7, ow mild degenerative change, no fracture Left clavicle:negative Left shoulder:joint replacement hardware in place, OW  negative Left humerus:negative Left elbow:negative    CERVICAL SPINE 4+ VIEWS  COMPARISON: None.  FINDINGS: Frontal, lateral, open-mouth odontoid, and bilateral oblique views were obtained. There is no fracture or spondylolisthesis. Prevertebral soft tissues and predental space regions are normal. There is fairly marked disc space narrowing at C6-7. There is milder narrowing at C7-T1. There is also mild narrowing at C4-5. There is facet hypertrophy with exit foraminal narrowing at C4-5 and C6-7 bilaterally. There is milder exit foraminal narrowing at C5-6 bilaterally.  IMPRESSION: Areas of osteoarthritic change as described. No fracture or Spondylolisthesis. LEFT ELBOW - COMPLETE 3+ VIEW  COMPARISON: None.  FINDINGS: There is no evidence of fracture, dislocation, or joint effusion. There is no evidence of arthropathy or other focal bone abnormality. Soft tissues are unremarkable.  IMPRESSION: Negative.  Left Humerus  COMPARISON: None.  FINDINGS: Status post left shoulder arthroplasty. No fracture or dislocation is noted. No soft tissue abnormality is noted.  IMPRESSION: No acute abnormality seen in the left humerus.  LEFT CLAVICLE - 2+ VIEWS  COMPARISON: None.  FINDINGS: There is no evidence of fracture or other focal bone lesions. Soft tissues are unremarkable. LEFT shoulder replacement described separately.  IMPRESSION: Negative  LEFT SHOULDER - 2+ VIEW  COMPARISON: 05/25/2014 and 05/23/2014  FINDINGS: Post reverse left shoulder arthroplasty with similar appearance to prior postoperative exams without evidence of fracture or dislocation.  Visualized lungs clear.  Cardiomegaly, tortuous aorta with central pulmonary vascular prominence.  IMPRESSION: Post  reverse left shoulder arthroplasty with similar appearance to prior postoperative exams without evidence of fracture or dislocation. Assessment & Plan:  Pain of left arm -  Plan: DG Shoulder Left, DG Humerus Left, DG Elbow Complete Left, DG Cervical Spine Complete, DG Clavicle Left  Fall, initial encounter - Plan: DG Shoulder Left, DG Humerus Left, DG Elbow Complete Left, DG Cervical Spine Complete, DG Clavicle Left   Fall yesterday towards right with resulting left neck and shoulder pain in distribution of trapezius/deltoid with radiation down arm, no numbness/tingling, some bony tenderness left AC, acromion, clavicle, c-spine, and elbow. Most likely muscle spasm of trapezius with radiation of pain but in deconditioning in elderly patient and bony tenderness, evaluated for fracture.  All films negative. No neurologic complaint.  She is reassured and will follow-up if not feeling better in the next few days- Sooner if worse.

## 2015-02-05 ENCOUNTER — Ambulatory Visit (INDEPENDENT_AMBULATORY_CARE_PROVIDER_SITE_OTHER): Payer: Medicare Other | Admitting: *Deleted

## 2015-02-05 DIAGNOSIS — Z23 Encounter for immunization: Secondary | ICD-10-CM | POA: Diagnosis not present

## 2015-11-17 ENCOUNTER — Encounter: Payer: Self-pay | Admitting: Urgent Care

## 2015-11-17 ENCOUNTER — Ambulatory Visit (INDEPENDENT_AMBULATORY_CARE_PROVIDER_SITE_OTHER): Payer: Medicare Other | Admitting: Urgent Care

## 2015-11-17 ENCOUNTER — Ambulatory Visit (INDEPENDENT_AMBULATORY_CARE_PROVIDER_SITE_OTHER): Payer: Medicare Other

## 2015-11-17 VITALS — BP 136/80 | HR 88 | Temp 98.4°F | Resp 18 | Ht 64.75 in | Wt 171.0 lb

## 2015-11-17 DIAGNOSIS — M25511 Pain in right shoulder: Secondary | ICD-10-CM | POA: Diagnosis not present

## 2015-11-17 DIAGNOSIS — M542 Cervicalgia: Secondary | ICD-10-CM | POA: Diagnosis not present

## 2015-11-17 DIAGNOSIS — M7501 Adhesive capsulitis of right shoulder: Secondary | ICD-10-CM | POA: Diagnosis not present

## 2015-11-17 DIAGNOSIS — Z8739 Personal history of other diseases of the musculoskeletal system and connective tissue: Secondary | ICD-10-CM

## 2015-11-17 MED ORDER — HYDROCODONE-ACETAMINOPHEN 5-325 MG PO TABS: 1.0000 | ORAL_TABLET | Freq: Four times a day (QID) | ORAL | 0 refills | Status: DC | PRN

## 2015-11-17 NOTE — Progress Notes (Signed)
MRN: [id number] DOB: 1927-10-31  Subjective:   Jenna Murphy is a 82 y.o. female presenting for chief complaint of Shoulder Pain (right)  Reports ~1 year history of worsening right shoulder pain, radiates from her right neck/trapezius down into her bicep and occasionally into her forearm. Reports difficulty lifting arm due to her shoulder pain. Has been using Alleve but is no longer helping patient. Denies fever, trauma, numbness or tingling. Admits history of arthritis in her left shoulder leading to surgery in 2015. Reports that her right shoulder is now hurting in exactly the same way as her left shoulder used to hurt. Denies smoking cigarettes or drinking alcohol.   Jenna Murphy has a current medication list which includes the following prescription(s): albiglutide, atorvastatin, cartia xt, glyburide-metformin, valsartan-hydrochlorothiazide, docusate sodium, and oxycodone-acetaminophen. Also has No Known Allergies.  Jenna Murphy  has a past medical history of Arthritis; Diabetes mellitus; Gallstone pancreatitis (02/2008); Hypertension; and Polio. Also  has a past surgical history that includes Cholecystectomy; Steriod injection (Left, 05/23/2014); and Reverse shoulder arthroplasty (Left, 05/23/2014).  Objective:   Vitals: BP 136/80 (BP Location: Right Arm, Patient Position: Sitting, Cuff Size: Small)   Pulse 88   Temp 98.4 F (36.9 C) (Oral)   Resp 18   Ht 5' 4.75" (1.645 m)   Wt 171 lb (77.6 kg)   SpO2 96%   BMI 28.68 kg/m   Physical Exam  Constitutional: She is oriented to person, place, and time. She appears well-developed and well-nourished.  Cardiovascular: Normal rate.   Pulmonary/Chest: Effort normal.  Musculoskeletal:       Right shoulder: She exhibits decreased range of motion, tenderness (AC join, trapezius, biceps tendon), bony tenderness, spasm and decreased strength. She exhibits no swelling, no effusion, no crepitus and no deformity.       Cervical back: She exhibits tenderness  (right sided paraspinal muscles). She exhibits normal range of motion, no bony tenderness, no swelling, no edema, no deformity and no spasm.  Neurological: She is alert and oriented to person, place, and time.   Dg Cervical Spine Complete  Result Date: 11/17/2015 CLINICAL DATA:  82 year old female with right shoulder pain progressive for 1 year, radiating from the neck to the arm. Difficulty lifting arm. Arthritis. Initial encounter. EXAM: CERVICAL SPINE - COMPLETE 4+ VIEW COMPARISON:  Cervical spine series 412 2016. FINDINGS: Prevertebral soft tissue contours remain normal. Chronic straightening of cervical lordosis. Chronic disc space loss and endplate spurring at C6-C7, and to a lesser extent C4-C5. Moderate to severe chronic facet hypertrophy, left greater than right from C4-C6. Bilateral posterior element alignment is within normal limits. Mildly increased dextro convex cervical scoliosis. AP alignment otherwise within normal limits. C1-C2 alignment appears normal. Cervicothoracic junction alignment is within normal limits. Negative lung apices. Left shoulder arthroplasty and right glenohumeral joint degeneration also noted. IMPRESSION: 1.  No acute osseous abnormality identified in the cervical spine. 2. Advanced chronic facet degeneration from C4 to C6, and disc and endplate degeneration at C6-C7. Electronically Signed   By: Odessa FlemingH  Hall M.D.   On: 11/17/2015 09:56   Dg Shoulder Right  Result Date: 11/17/2015 CLINICAL DATA:  [age] year old female with right shoulder pain progressive for 1 year, radiating from the neck to the arm. Difficulty lifting arm. Arthritis. Initial encounter. EXAM: RIGHT SHOULDER - 2+ VIEW COMPARISON:  Chest radiographs 05/15/2014. Right shoulder series 10/25/2005 FINDINGS: No glenohumeral joint dislocation. There is moderate to severe glenohumeral joint space loss. Humeral head degenerative osteophytosis has significantly progressed since 2007. Right clavicle and  scapula appear intact.  Chronic right AC joint degeneration. Visible right ribs and lung parenchyma appear stable. Calcified aortic atherosclerosis. IMPRESSION: 1. Progressed chronic right shoulder degeneration. No acute osseous abnormality identified. 2.  Calcified aortic atherosclerosis. Electronically Signed   By: Odessa Fleming M.D.   On: 11/17/2015 09:54   Assessment and Plan :   1. Pain in joint of right shoulder 2. Neck pain 3. History of arthritis - Discussed case with Dr. Neva Seat. Differential includes osteoarthritis versus adhesive capsulitis. Will consider short steroid taper, NSAID, PT/ortho referral. May use opioid medication for better pain control. Dr. Neva Seat will discuss options with patient and finish their visit.  Wallis Bamberg, PA-C [facility] [phone] 11/17/2015 9:22 AM

## 2015-11-17 NOTE — Patient Instructions (Addendum)
We will refer you to [facility]. Ave Filterhandler [facility] to evaluate your shoulder further. Your pain may be due to arthritis or a possible frozen shoulder. Tylenol would be safest to take over-the-counter for pain, but if this does not help, you can change to hydrocodone that was prescribed today. Be very careful taking this medication as it can cause sedation, dizziness, and increased risk of falling. Shoulder sling if needed for comfort, but try to come out of the sling at least once a day for gentle range of motion. If any worsening of symptoms prior to visit with your orthopedist, return for recheck.   IF you received an x-ray today, you will receive an invoice from [facility]. Please contact [facility] at [phone] with questions or concerns regarding your invoice.   IF you received labwork today, you will receive an invoice from [facility]. Please contact [facility] at [phone] with questions or concerns regarding your invoice.   Our billing staff will not be able to assist you with questions regarding bills from these companies.  You will be contacted with the lab results as soon as they are available. The fastest way to get your results is to activate your My Chart account. Instructions are located on the last page of this paperwork. If you have not heard from us regarding the results in 2 weeks, please contact this office.     Adhesive Capsulitis Adhesive capsulitis is inflammation of the tendons and ligaments that surround the shoulder joint (shoulder capsule). This condition causes the shoulder to become stiff and painful to move. Adhesive capsulitis is also called frozen shoulder. CAUSES This condition may be caused by:  An injury to the shoulder joint.  Straining the shoulder.  Not moving the shoulder for a period of time. This can happen if your arm was injured or in a sling.  Long-standing health problems, such  as:  Diabetes.  Thyroid problems.  Heart disease.  Stroke.  Rheumatoid arthritis.  Lung disease. In some cases, the cause may not be known. RISK FACTORS This condition is more likely to develop in:  Women.  People who are older than 80 years of age. SYMPTOMS Symptoms of this condition include:  Pain in the shoulder when moving the arm. There may also be pain when parts of the shoulder are touched. The pain is worse at night or when at rest.  Soreness or aching in the shoulder.  Inability to move the shoulder normally.  Muscle spasms. DIAGNOSIS This condition is diagnosed with a physical exam and imaging tests, such as an X-ray or MRI. TREATMENT This condition may be treated with:  Treatment of the underlying cause or condition.  Physical therapy. This involves performing exercises to get the shoulder moving again.  Medicine. Medicine may be given to relieve pain, inflammation, or muscle spasms.  Steroid injections into the shoulder joint.  Shoulder manipulation. This is a procedure to move the shoulder into another position. It is done after you are given a medicine to make you fall asleep (general anesthetic). The joint may also be injected with salt water at high pressure to break down scarring.  Surgery. This may be done in severe cases when other treatments have failed. Although most people recover completely from adhesive capsulitis, some may not regain the full movement of the shoulder. HOME CARE INSTRUCTIONS  Take over-the-counter and prescription medicines only as told by your health care provider.  If you are being treated with physical therapy, follow instructions  from your physical therapist.  Avoid exercises that put a lot of demand on your shoulder, such as throwing. These exercises can make pain worse.  If directed, apply ice to the injured area:  Put ice in a plastic bag.  Place a towel between your skin and the bag.  Leave the ice on for 20  minutes, 2-3 times per day. SEEK MEDICAL CARE IF:  You develop new symptoms.  Your symptoms get worse.   This information is not intended to replace advice given to you by your health care provider. Make sure you discuss any questions you have with your health care provider.   Document Released: 01/24/2009 Document Revised: 12/18/2014 Document Reviewed: 07/22/2014 Elsevier Interactive Patient Education [employer]

## 2015-11-17 NOTE — Progress Notes (Signed)
Patient discussed with Gurney MaxinMike Mani, PA-C. I reviewed the x-ray reports, and on my exam, guarded with range of motion shoulder including passive range of motion beyond approximately 45 of abduction or flexion. Also tender over anterior clavicle/AC.    - Sleeping time if needed, use sporadically, but gentle range of motion including pendulum swings discussed.  -Over-the-counter Tylenol would be safest given age, hydrocodone if needed for breakthrough pain. Side effects discussed. RTC precautions if worsening.  Dg Cervical Spine Complete  Result Date: 11/17/2015 CLINICAL DATA:  82 year old female with right shoulder pain progressive for 1 year, radiating from the neck to the arm. Difficulty lifting arm. Arthritis. Initial encounter. EXAM: CERVICAL SPINE - COMPLETE 4+ VIEW COMPARISON:  Cervical spine series 412 2016. FINDINGS: Prevertebral soft tissue contours remain normal. Chronic straightening of cervical lordosis. Chronic disc space loss and endplate spurring at C6-C7, and to a lesser extent C4-C5. Moderate to severe chronic facet hypertrophy, left greater than right from C4-C6. Bilateral posterior element alignment is within normal limits. Mildly increased dextro convex cervical scoliosis. AP alignment otherwise within normal limits. C1-C2 alignment appears normal. Cervicothoracic junction alignment is within normal limits. Negative lung apices. Left shoulder arthroplasty and right glenohumeral joint degeneration also noted. IMPRESSION: 1.  No acute osseous abnormality identified in the cervical spine. 2. Advanced chronic facet degeneration from C4 to C6, and disc and endplate degeneration at C6-C7. Electronically Signed   By: Odessa FlemingH  Hall M.D.   On: 11/17/2015 09:56   Dg Shoulder Right  Result Date: 11/17/2015 CLINICAL DATA:  82 year old female with right shoulder pain progressive for 1 year, radiating from the neck to the arm. Difficulty lifting arm. Arthritis. Initial encounter. EXAM: RIGHT SHOULDER - 2+ VIEW  COMPARISON:  Chest radiographs 05/15/2014. Right shoulder series 10/25/2005 FINDINGS: No glenohumeral joint dislocation. There is moderate to severe glenohumeral joint space loss. Humeral head degenerative osteophytosis has significantly progressed since 2007. Right clavicle and scapula appear intact. Chronic right AC joint degeneration. Visible right ribs and lung parenchyma appear stable. Calcified aortic atherosclerosis. IMPRESSION: 1. Progressed chronic right shoulder degeneration. No acute osseous abnormality identified. 2.  Calcified aortic atherosclerosis. Electronically Signed   By: Odessa FlemingH  Hall M.D.   On: 11/17/2015 09:54

## 2015-11-28 ENCOUNTER — Other Ambulatory Visit: Payer: Self-pay | Admitting: Orthopedic Surgery

## 2015-12-04 NOTE — Pre-Procedure Instructions (Signed)
Jenna Murphy  12/04/2015      [facility] [phone] - [place] - [address] AT [facility] [address] [address] [place] [id number] Phone: [phone] Fax: [phone]    Your procedure is scheduled on Thursday, December 11, 2015  [facility] Ad[facility] at 5:30 A.M.  Call this number if you have problems the morning of surgery:  [phone]   Remember:  Do not eat food or drink liquids after midnight Wednesday, December 10, 2015  Take these medicines the morning of surgery with A SIP OF WATER : Cartia, if needed: Tylenol for pain Stop taking Aspirin, vitamins, fish oil and herbal medications.   Do not take any NSAIDs ie: Ibuprofen, Advil, Naproxen, BC and Goody Powder or any medication containing Aspirin; stop now.  WHAT DO I DO ABOUT MY DIABETES MEDICATION?   Marland Kitchen. Do not take oral diabetes medicines (pills) the morning of surgery such as Metformin ( Glucophage)  . THE NIGHT BEFORE SURGERY, take 10 units Insulin Glargine ( Toujeo Solostar)      . HE MORNING OF SURGERY, take 7 units Insulin Glargine (Toujeo Solostar)  . The day of surgery, do not take other diabetes injectables, including Byetta (exenatide), Bydureon (exenatide ER), Victoza (liraglutide), or Trulicity (dulaglutide).  . If your CBG is greater than 220 mg/dL, you may take  of your sliding scale (correction) dose of insulin.   How to Manage Your Diabetes Before and After Surgery  Why is it important to control my blood sugar before and after surgery? . Improving blood sugar levels before and after surgery helps healing and can limit problems. . A way of improving blood sugar control is eating a healthy diet by: o  Eating less sugar and carbohydrates o  Increasing activity/exercise o  Talking with your doctor about reaching your blood sugar goals . High blood sugars (greater than 180 mg/dL) can raise your risk of infections and slow your recovery, so  you will need to focus on controlling your diabetes during the weeks before surgery. . Make sure that the doctor who takes care of your diabetes knows about your planned surgery including the date and location.  How do I manage my blood sugar before surgery? . Check your blood sugar at least 4 times a day, starting 2 days before surgery, to make sure that the level is not too high or low. o Check your blood sugar the morning of your surgery when you wake up and every 2 hours until you get to the Short Stay unit. . If your blood sugar is less than 70 mg/dL, you will need to treat for low blood sugar: o Do not take insulin. o Treat a low blood sugar (less than 70 mg/dL) with  cup of clear juice (cranberry or apple), 4 glucose tablets, OR glucose gel. o Recheck blood sugar in 15 minutes after treatment (to make sure it is greater than 70 mg/dL). If your blood sugar is not greater than 70 mg/dL on recheck, call [phone] for further instructions. . Report your blood sugar to the short stay nurse when you get to Short Stay.  . If you are admitted to the hospital after surgery: o Your blood sugar will be checked by the staff and you will probably be given insulin after surgery (instead of oral diabetes medicines) to make sure you have good blood sugar levels. o The goal for blood sugar control after  surgery is 80-180 mg/dL.     Do not wear jewelry, make-up or nail polish.  Do not wear lotions, powders, or perfumes, or deoderant.  Do not shave 48 hours prior to surgery.    Do not bring valuables to the hospital.  [facility] is not responsible for any belongings or valuables.  Contacts, dentures or bridgework may not be worn into surgery.  Leave your suitcase in the car.  After surgery it may be brought to your room.  For patients admitted to the hospital, discharge time will be determined by your treatment team.  Patients discharged the day of surgery will not be allowed to drive home.    Name and phone number of your driver:  Special instructions:  [place]- Preparing For Surgery  Before surgery, you can play an important role. Because skin is not sterile, your skin needs to be as free of germs as possible. You can reduce the number of germs on your skin by washing with CHG (chlorahexidine gluconate) Soap before surgery.  CHG is an antiseptic cleaner which kills germs and bonds with the skin to continue killing germs even after washing.  Please do not use if you have an allergy to CHG or antibacterial soaps. If your skin becomes reddened/irritated stop using the CHG.  Do not shave (including legs and underarms) for at least 48 hours prior to first CHG shower. It is OK to shave your face.  Please follow these instructions carefully.   1. Shower the NIGHT BEFORE SURGERY and the MORNING OF SURGERY with CHG.   2. If you chose to wash your hair, wash your hair first as usual with your normal shampoo.  3. After you shampoo, rinse your hair and body thoroughly to remove the shampoo.  4. Use CHG as you would any other liquid soap. You can apply CHG directly to the skin and wash gently with a scrungie or a clean washcloth.   5. Apply the CHG Soap to your body ONLY FROM THE NECK DOWN.  Do not use on open wounds or open sores. Avoid contact with your eyes, ears, mouth and genitals (private parts). Wash genitals (private parts) with your normal soap.  6. Wash thoroughly, paying special attention to the area where your surgery will be performed.  7. Thoroughly rinse your body with warm water from the neck down.  8. DO NOT shower/wash with your normal soap after using and rinsing off the CHG Soap.  9. Pat yourself dry with a CLEAN TOWEL.   10. Wear CLEAN PAJAMAS   11. Place CLEAN SHEETS on your bed the night of your first shower and DO NOT SLEEP WITH PETS.    Day of Surgery: Do not apply any deodorants/lotions. Please wear clean clothes to the hospital/surgery center.       Please read over the following fact sheets that you were given. Pain Booklet, Coughing and Deep Breathing, Blood Transfusion Information, MRSA Information and Surgical Site Infection Prevention

## 2015-12-05 ENCOUNTER — Encounter (HOSPITAL_COMMUNITY): Payer: Self-pay

## 2015-12-05 ENCOUNTER — Encounter (HOSPITAL_COMMUNITY)
Admission: RE | Admit: 2015-12-05 | Discharge: 2015-12-05 | Disposition: A | Discharge status: Home / Self Care | Payer: Medicare Other | Source: Ambulatory Visit | Attending: Orthopedic Surgery | Admitting: Orthopedic Surgery

## 2015-12-05 ENCOUNTER — Ambulatory Visit (HOSPITAL_COMMUNITY)
Admission: RE | Admit: 2015-12-05 | Discharge: 2015-12-05 | Disposition: A | Discharge status: Home / Self Care | Payer: Medicare Other | Source: Ambulatory Visit | Attending: Orthopedic Surgery | Admitting: Orthopedic Surgery

## 2015-12-05 DIAGNOSIS — Z01818 Encounter for other preprocedural examination: Secondary | ICD-10-CM | POA: Insufficient documentation

## 2015-12-05 DIAGNOSIS — R918 Other nonspecific abnormal finding of lung field: Secondary | ICD-10-CM | POA: Insufficient documentation

## 2015-12-05 DIAGNOSIS — Z87891 Personal history of nicotine dependence: Secondary | ICD-10-CM | POA: Insufficient documentation

## 2015-12-05 DIAGNOSIS — Z7984 Long term (current) use of oral hypoglycemic drugs: Secondary | ICD-10-CM | POA: Insufficient documentation

## 2015-12-05 DIAGNOSIS — E119 Type 2 diabetes mellitus without complications: Secondary | ICD-10-CM | POA: Insufficient documentation

## 2015-12-05 DIAGNOSIS — I10 Essential (primary) hypertension: Secondary | ICD-10-CM | POA: Diagnosis not present

## 2015-12-05 DIAGNOSIS — Z01812 Encounter for preprocedural laboratory examination: Secondary | ICD-10-CM | POA: Insufficient documentation

## 2015-12-05 DIAGNOSIS — I700 Atherosclerosis of aorta: Secondary | ICD-10-CM | POA: Diagnosis not present

## 2015-12-05 DIAGNOSIS — I4510 Unspecified right bundle-branch block: Secondary | ICD-10-CM | POA: Insufficient documentation

## 2015-12-05 DIAGNOSIS — Z79899 Other long term (current) drug therapy: Secondary | ICD-10-CM | POA: Diagnosis not present

## 2015-12-05 DIAGNOSIS — M19011 Primary osteoarthritis, right shoulder: Secondary | ICD-10-CM | POA: Diagnosis not present

## 2015-12-05 DIAGNOSIS — Z0183 Encounter for blood typing: Secondary | ICD-10-CM | POA: Diagnosis not present

## 2015-12-05 HISTORY — DX: Carpal tunnel syndrome, left upper limb: G56.02

## 2015-12-05 HISTORY — DX: Cardiac murmur, unspecified: R01.1

## 2015-12-05 LAB — TYPE AND SCREEN
ABO/RH(D): O POS
ANTIBODY SCREEN: NEGATIVE

## 2015-12-05 LAB — CBC WITH DIFFERENTIAL/PLATELET
BASOS ABS: 0 10*3/uL (ref 0.0–0.1)
BASOS PCT: 0 %
EOS ABS: 0.1 10*3/uL (ref 0.0–0.7)
Eosinophils Relative: 1 %
HEMATOCRIT: 41.4 % (ref 36.0–46.0)
HEMOGLOBIN: 13 g/dL (ref 12.0–15.0)
Lymphocytes Relative: 43 %
Lymphs Abs: 3.4 10*3/uL (ref 0.7–4.0)
MCH: 25.5 pg — ABNORMAL LOW (ref 26.0–34.0)
MCHC: 31.4 g/dL (ref 30.0–36.0)
MCV: 81.3 fL (ref 78.0–100.0)
MONOS PCT: 6 %
Monocytes Absolute: 0.5 10*3/uL (ref 0.1–1.0)
NEUTROS ABS: 4.1 10*3/uL (ref 1.7–7.7)
NEUTROS PCT: 50 %
Platelets: 305 10*3/uL (ref 150–400)
RBC: 5.09 MIL/uL (ref 3.87–5.11)
RDW: 14.6 % (ref 11.5–15.5)
WBC: 8.1 10*3/uL (ref 4.0–10.5)

## 2015-12-05 LAB — GLUCOSE, CAPILLARY: GLUCOSE-CAPILLARY: 95 mg/dL (ref 65–99)

## 2015-12-05 LAB — COMPREHENSIVE METABOLIC PANEL
ALBUMIN: 4 g/dL (ref 3.5–5.0)
ALK PHOS: 85 U/L (ref 38–126)
ALT: 21 U/L (ref 14–54)
ANION GAP: 12 (ref 5–15)
AST: 22 U/L (ref 15–41)
BUN: 16 mg/dL (ref 6–20)
CALCIUM: 9.8 mg/dL (ref 8.9–10.3)
CO2: 24 mmol/L (ref 22–32)
Chloride: 104 mmol/L (ref 101–111)
Creatinine, Ser: 1.13 mg/dL — ABNORMAL HIGH (ref 0.44–1.00)
GFR calc Af Amer: 50 mL/min — ABNORMAL LOW (ref >60)
GFR calc non Af Amer: 43 mL/min — ABNORMAL LOW (ref >60)
GLUCOSE: 93 mg/dL (ref 65–99)
POTASSIUM: 3.9 mmol/L (ref 3.5–5.1)
SODIUM: 140 mmol/L (ref 135–145)
Total Bilirubin: 0.8 mg/dL (ref 0.3–1.2)
Total Protein: 7.9 g/dL (ref 6.5–8.1)

## 2015-12-05 LAB — PROTIME-INR
INR: 1.04
Prothrombin Time: 13.6 seconds (ref 11.4–15.2)

## 2015-12-05 LAB — APTT: APTT: 26 s (ref 24–36)

## 2015-12-05 LAB — SURGICAL PCR SCREEN
MRSA, PCR: NEGATIVE
STAPHYLOCOCCUS AUREUS: NEGATIVE

## 2015-12-05 NOTE — Progress Notes (Signed)
PCP - Andi DevonKimberly Shelton Cardiologist - denies  Chest x-ray - 12/05/15 EKG - 12/05/15 Stress Test - 12-18-00 ECHO - denies Cardiac Cath - denies  Fasting blood sugar around 150s patient usually checks her blood sugar in the mornings    Patient denies shortness of breath, fever, cough and chest pain at PAT appointment

## 2015-12-06 LAB — HEMOGLOBIN A1C
HEMOGLOBIN A1C: 8.2 % — AB (ref 4.8–5.6)
MEAN PLASMA GLUCOSE: 189 mg/dL

## 2015-12-08 NOTE — Progress Notes (Addendum)
Anesthesia Chart Review: Patient is an 82 year old female scheduled for right reverse total shoulder arthroplasty on 12/11/15 by Dr. Ave Filterhandler.  PMH includes: HTN, DM2, polio, murmur (not specified; reported as remote history of according to PAT RN), former smoker, gallstone pancreatitis s/p cholecystectomy 02/25/08, s/p left reverse shoulder arthroplasty 05/23/14.   PCP is listed as Dr. Andi DevonKimberly Shelton.  Meds include Lipitor, Cardia XT, Toujeo, metformin, valsartan.  BP (!) 177/87   Pulse 75   Temp 36.7 C   Resp 18   Ht 5' 4.5" (1.638 m)   Wt 168 lb 4.8 oz (76.3 kg)   SpO2 98%   BMI 28.44 kg/m   12/05/15 EKG: SR with PACs, LAD, incomplete right BBB, LVH, anteroseptal infarct (age undetermined). No significant changes since 05/15/14 tracing (which was not significantly changed from his 05/21/11 tracing). She denied a prior echo or cath. She is not followed by a cardiologist.   12/17/00 Nuclear stress test 12/17/2000: Impression: A small region of anteroapical ischemia cannot be excluded based on the nuclear medicine study. There is a component of mild fixed attenuation of the apex either on the basis of thinning, scar or breast attenuation. However the small defect is definitely more prominent on the stress study raising the possibility of ischemia. (Study previously reviewed with anesthesiologist Dr. Okey Dupreose in 05/2014 prior to patient undergoing left reverse shoulder arthroplasty.)  12/05/15 CXR: IMPRESSION: Chronic bronchitic changes, stable. Mild cardiomegaly and central pulmonary vascular prominence. No definite interstitial or alveolar edema. Aortic atherosclerosis.  Preoperative labs noted. A1c 8.2. Unable to get UA at PAT, so scheduled to get urine specimen on arrival day of surgery.   Reviewed with anesthesiologist Dr. Mable FillB. Judd. Patient underwent similar procedure last year. I attempted to contact patient to ensure no new issues or symptoms since then, but was unable to reach patient at phone  number listed (only rang, but no answer after approximately 10 rings). Can attempt later, but if unsuccessful then her anesthesiologist can evaluate her on the day of surgery. If no acute changes then would anticipate that she could proceed as planned.  Velna Ochsllison Syrina Wake, PA-C [facility]/Anesthesiology Phone [phone] 12/08/2015 4:24 PM  Addendum: I was able to reach patient by phone this morning. She denied any new issues since her left total shoulder last year. Her shoulder feels much better, but having issues with left carpal tunnel syndrome. She lives alone and is able to clean her house. She denied chest pain, SOB, edema, syncope. She reports being told she had a murmur during pregnancy, but had subsequent testing and was told "no problem." She believes her last echo was about 10 years ago. She reports fasting CBGs are typically 130 or less. No change in my previous recommendations. Further evaluation by her anesthesiologist tomorrow.  Velna Ochsllison Avianna Moynahan, PA-C [facility]Anesthesiology Phone [phone] 12/10/2015 10:37 AM

## 2015-12-11 ENCOUNTER — Encounter (HOSPITAL_COMMUNITY): Payer: Self-pay | Admitting: Urology

## 2015-12-11 ENCOUNTER — Inpatient Hospital Stay (HOSPITAL_COMMUNITY)
Admission: RE | Admit: 2015-12-11 | Discharge: 2015-12-12 | DRG: 483 | Disposition: A | Discharge status: Home / Self Care | Payer: Medicare Other | Source: Ambulatory Visit | Attending: Orthopedic Surgery | Admitting: Orthopedic Surgery

## 2015-12-11 ENCOUNTER — Inpatient Hospital Stay (HOSPITAL_COMMUNITY): Payer: Medicare Other | Admitting: Anesthesiology

## 2015-12-11 ENCOUNTER — Inpatient Hospital Stay (HOSPITAL_COMMUNITY): Payer: Medicare Other

## 2015-12-11 ENCOUNTER — Encounter (HOSPITAL_COMMUNITY)
Admission: RE | Disposition: A | Discharge status: Home / Self Care | Payer: Self-pay | Source: Ambulatory Visit | Attending: Orthopedic Surgery

## 2015-12-11 ENCOUNTER — Inpatient Hospital Stay (HOSPITAL_COMMUNITY): Payer: Medicare Other | Admitting: Vascular Surgery

## 2015-12-11 DIAGNOSIS — Z8612 Personal history of poliomyelitis: Secondary | ICD-10-CM | POA: Diagnosis not present

## 2015-12-11 DIAGNOSIS — Z87891 Personal history of nicotine dependence: Secondary | ICD-10-CM | POA: Diagnosis not present

## 2015-12-11 DIAGNOSIS — E119 Type 2 diabetes mellitus without complications: Secondary | ICD-10-CM | POA: Diagnosis present

## 2015-12-11 DIAGNOSIS — R011 Cardiac murmur, unspecified: Secondary | ICD-10-CM | POA: Diagnosis present

## 2015-12-11 DIAGNOSIS — I119 Hypertensive heart disease without heart failure: Secondary | ICD-10-CM | POA: Diagnosis present

## 2015-12-11 DIAGNOSIS — Z96619 Presence of unspecified artificial shoulder joint: Secondary | ICD-10-CM

## 2015-12-11 DIAGNOSIS — Z79899 Other long term (current) drug therapy: Secondary | ICD-10-CM

## 2015-12-11 DIAGNOSIS — Z7984 Long term (current) use of oral hypoglycemic drugs: Secondary | ICD-10-CM

## 2015-12-11 DIAGNOSIS — Z8249 Family history of ischemic heart disease and other diseases of the circulatory system: Secondary | ICD-10-CM | POA: Diagnosis not present

## 2015-12-11 DIAGNOSIS — M19011 Primary osteoarthritis, right shoulder: Secondary | ICD-10-CM | POA: Diagnosis present

## 2015-12-11 DIAGNOSIS — Z96611 Presence of right artificial shoulder joint: Secondary | ICD-10-CM

## 2015-12-11 DIAGNOSIS — Z96612 Presence of left artificial shoulder joint: Secondary | ICD-10-CM | POA: Diagnosis present

## 2015-12-11 HISTORY — PX: REVERSE SHOULDER ARTHROPLASTY: SHX5054

## 2015-12-11 LAB — GLUCOSE, CAPILLARY
GLUCOSE-CAPILLARY: 175 mg/dL — AB (ref 65–99)
GLUCOSE-CAPILLARY: 249 mg/dL — AB (ref 65–99)
GLUCOSE-CAPILLARY: 224 mg/dL — AB (ref 65–99)
Glucose-Capillary: 130 mg/dL — ABNORMAL HIGH (ref 65–99)
Glucose-Capillary: 219 mg/dL — ABNORMAL HIGH (ref 65–99)

## 2015-12-11 LAB — URINALYSIS, ROUTINE W REFLEX MICROSCOPIC
BILIRUBIN URINE: NEGATIVE
GLUCOSE, UA: NEGATIVE mg/dL
HGB URINE DIPSTICK: NEGATIVE
Ketones, ur: NEGATIVE mg/dL
Nitrite: NEGATIVE
PH: 6 (ref 5.0–8.0)
Protein, ur: NEGATIVE mg/dL
SPECIFIC GRAVITY, URINE: 1.011 (ref 1.005–1.030)

## 2015-12-11 LAB — URINE MICROSCOPIC-ADD ON: RBC / HPF: NONE SEEN RBC/hpf (ref 0–5)

## 2015-12-11 SURGERY — ARTHROPLASTY, SHOULDER, TOTAL, REVERSE
Anesthesia: General | Laterality: Right

## 2015-12-11 MED ORDER — ACETAMINOPHEN 325 MG PO TABS: 650.0000 mg | ORAL_TABLET | Freq: Four times a day (QID) | ORAL | Status: DC | PRN

## 2015-12-11 MED ORDER — IRBESARTAN 300 MG PO TABS
300.0000 mg | ORAL_TABLET | Freq: Every day | ORAL | Status: DC
  Administered 2015-12-11: 300 mg via ORAL
  Filled 2015-12-11: qty 1

## 2015-12-11 MED ORDER — CEFAZOLIN IN D5W 1 GM/50ML IV SOLN
1.0000 g | Freq: Four times a day (QID) | INTRAVENOUS | Status: AC
  Administered 2015-12-11 – 2015-12-12 (×3): 1 g via INTRAVENOUS
  Filled 2015-12-11 (×4): qty 50

## 2015-12-11 MED ORDER — ARTIFICIAL TEARS OP OINT
TOPICAL_OINTMENT | OPHTHALMIC | Status: DC | PRN
  Administered 2015-12-11: 1 via OPHTHALMIC

## 2015-12-11 MED ORDER — POVIDONE-IODINE 7.5 % EX SOLN
Freq: Once | CUTANEOUS | Status: DC
  Filled 2015-12-11: qty 118

## 2015-12-11 MED ORDER — LACTATED RINGERS IV SOLN
INTRAVENOUS | Status: DC | PRN
  Administered 2015-12-11 (×2): via INTRAVENOUS

## 2015-12-11 MED ORDER — PROPOFOL 10 MG/ML IV BOLUS
INTRAVENOUS | Status: DC | PRN
  Administered 2015-12-11: 150 mg via INTRAVENOUS

## 2015-12-11 MED ORDER — BISACODYL 5 MG PO TBEC: 5.0000 mg | DELAYED_RELEASE_TABLET | Freq: Every day | ORAL | Status: DC | PRN

## 2015-12-11 MED ORDER — ROCURONIUM BROMIDE 100 MG/10ML IV SOLN
INTRAVENOUS | Status: DC | PRN
  Administered 2015-12-11: 40 mg via INTRAVENOUS

## 2015-12-11 MED ORDER — ONDANSETRON HCL 4 MG PO TABS: 4.0000 mg | ORAL_TABLET | Freq: Four times a day (QID) | ORAL | Status: DC | PRN

## 2015-12-11 MED ORDER — FENTANYL CITRATE (PF) 100 MCG/2ML IJ SOLN
INTRAMUSCULAR | Status: AC
  Filled 2015-12-11: qty 2

## 2015-12-11 MED ORDER — PHENOL 1.4 % MT LIQD: 1.0000 | OROMUCOSAL | Status: DC | PRN

## 2015-12-11 MED ORDER — DIPHENHYDRAMINE HCL 12.5 MG/5ML PO ELIX: 12.5000 mg | ORAL_SOLUTION | ORAL | Status: DC | PRN

## 2015-12-11 MED ORDER — DOCUSATE SODIUM 100 MG PO CAPS
100.0000 mg | ORAL_CAPSULE | Freq: Two times a day (BID) | ORAL | Status: DC
  Administered 2015-12-11: 100 mg via ORAL
  Filled 2015-12-11: qty 1

## 2015-12-11 MED ORDER — MENTHOL 3 MG MT LOZG
1.0000 | LOZENGE | OROMUCOSAL | Status: DC | PRN
  Filled 2015-12-11: qty 9

## 2015-12-11 MED ORDER — INSULIN GLARGINE 300 UNIT/ML ~~LOC~~ SOPN: 15.0000 [IU] | PEN_INJECTOR | SUBCUTANEOUS | Status: DC

## 2015-12-11 MED ORDER — MORPHINE SULFATE (PF) 2 MG/ML IV SOLN: 1.0000 mg | INTRAVENOUS | Status: DC | PRN

## 2015-12-11 MED ORDER — ATORVASTATIN CALCIUM 20 MG PO TABS
20.0000 mg | ORAL_TABLET | Freq: Every day | ORAL | Status: DC
  Administered 2015-12-11: 20 mg via ORAL
  Filled 2015-12-11: qty 1

## 2015-12-11 MED ORDER — ARTIFICIAL TEARS OP OINT
TOPICAL_OINTMENT | OPHTHALMIC | Status: AC
  Filled 2015-12-11: qty 3.5

## 2015-12-11 MED ORDER — METOPROLOL TARTARATE 1 MG/ML SYRINGE (5ML)
Status: DC | PRN
  Administered 2015-12-11: 2 mg via INTRAVENOUS
  Administered 2015-12-11 (×3): 1 mg via INTRAVENOUS

## 2015-12-11 MED ORDER — METOPROLOL TARTRATE 5 MG/5ML IV SOLN
INTRAVENOUS | Status: AC
  Filled 2015-12-11: qty 5

## 2015-12-11 MED ORDER — ALUM & MAG HYDROXIDE-SIMETH 200-200-20 MG/5ML PO SUSP: 30.0000 mL | ORAL | Status: DC | PRN

## 2015-12-11 MED ORDER — LIDOCAINE 2% (20 MG/ML) 5 ML SYRINGE
INTRAMUSCULAR | Status: AC
  Filled 2015-12-11: qty 5

## 2015-12-11 MED ORDER — TRANEXAMIC ACID 1000 MG/10ML IV SOLN
1000.0000 mg | INTRAVENOUS | Status: AC
  Administered 2015-12-11: 1000 mg via INTRAVENOUS
  Filled 2015-12-11: qty 10

## 2015-12-11 MED ORDER — POTASSIUM CHLORIDE IN NACL 20-0.45 MEQ/L-% IV SOLN
INTRAVENOUS | Status: DC
  Administered 2015-12-11: 12:00:00 via INTRAVENOUS
  Filled 2015-12-11 (×2): qty 1000

## 2015-12-11 MED ORDER — METOCLOPRAMIDE HCL 5 MG PO TABS: 5.0000 mg | ORAL_TABLET | Freq: Three times a day (TID) | ORAL | Status: DC | PRN

## 2015-12-11 MED ORDER — ACETAMINOPHEN 650 MG RE SUPP: 650.0000 mg | Freq: Four times a day (QID) | RECTAL | Status: DC | PRN

## 2015-12-11 MED ORDER — SODIUM CHLORIDE 0.9 % IR SOLN
Status: DC | PRN
  Administered 2015-12-11: 1000 mL

## 2015-12-11 MED ORDER — METFORMIN HCL 500 MG PO TABS: 500.0000 mg | ORAL_TABLET | Freq: Every day | ORAL | Status: DC

## 2015-12-11 MED ORDER — METOCLOPRAMIDE HCL 5 MG/ML IJ SOLN
5.0000 mg | Freq: Three times a day (TID) | INTRAMUSCULAR | Status: DC | PRN
  Administered 2015-12-11: 10 mg via INTRAVENOUS
  Filled 2015-12-11: qty 2

## 2015-12-11 MED ORDER — LABETALOL HCL 5 MG/ML IV SOLN
10.0000 mg | INTRAVENOUS | Status: AC | PRN
  Administered 2015-12-11 (×2): 10 mg via INTRAVENOUS

## 2015-12-11 MED ORDER — INSULIN GLARGINE 100 UNIT/ML ~~LOC~~ SOLN
20.0000 [IU] | Freq: Every day | SUBCUTANEOUS | Status: DC
  Administered 2015-12-11: 20 [IU] via SUBCUTANEOUS
  Filled 2015-12-11 (×2): qty 0.2

## 2015-12-11 MED ORDER — ONDANSETRON HCL 4 MG/2ML IJ SOLN
INTRAMUSCULAR | Status: AC
  Filled 2015-12-11: qty 2

## 2015-12-11 MED ORDER — OXYCODONE HCL 5 MG PO TABS
5.0000 mg | ORAL_TABLET | ORAL | Status: DC | PRN
  Administered 2015-12-11: 5 mg via ORAL
  Filled 2015-12-11: qty 1

## 2015-12-11 MED ORDER — CEFAZOLIN SODIUM-DEXTROSE 2-4 GM/100ML-% IV SOLN
2.0000 g | INTRAVENOUS | Status: AC
  Administered 2015-12-11: 2 g via INTRAVENOUS
  Filled 2015-12-11: qty 100

## 2015-12-11 MED ORDER — GLYCOPYRROLATE 0.2 MG/ML IV SOSY
PREFILLED_SYRINGE | INTRAVENOUS | Status: AC
  Filled 2015-12-11: qty 3

## 2015-12-11 MED ORDER — ACETAMINOPHEN 500 MG PO TABS
1000.0000 mg | ORAL_TABLET | Freq: Four times a day (QID) | ORAL | Status: AC
  Administered 2015-12-11 – 2015-12-12 (×4): 1000 mg via ORAL
  Filled 2015-12-11 (×4): qty 2

## 2015-12-11 MED ORDER — LIDOCAINE HCL (CARDIAC) 20 MG/ML IV SOLN
INTRAVENOUS | Status: DC | PRN
  Administered 2015-12-11: 40 mg via INTRATRACHEAL

## 2015-12-11 MED ORDER — DEXAMETHASONE SODIUM PHOSPHATE 10 MG/ML IJ SOLN
INTRAMUSCULAR | Status: DC | PRN
  Administered 2015-12-11: 5 mg via INTRAVENOUS

## 2015-12-11 MED ORDER — PROPOFOL 10 MG/ML IV BOLUS
INTRAVENOUS | Status: AC
  Filled 2015-12-11: qty 20

## 2015-12-11 MED ORDER — SUCCINYLCHOLINE CHLORIDE 200 MG/10ML IV SOSY
PREFILLED_SYRINGE | INTRAVENOUS | Status: AC
  Filled 2015-12-11: qty 10

## 2015-12-11 MED ORDER — INSULIN ASPART 100 UNIT/ML ~~LOC~~ SOLN
0.0000 [IU] | Freq: Three times a day (TID) | SUBCUTANEOUS | Status: DC
  Administered 2015-12-11 (×2): 5 [IU] via SUBCUTANEOUS
  Administered 2015-12-12: 8 [IU] via SUBCUTANEOUS

## 2015-12-11 MED ORDER — INSULIN GLARGINE 100 UNIT/ML ~~LOC~~ SOLN
15.0000 [IU] | Freq: Every day | SUBCUTANEOUS | Status: DC
  Filled 2015-12-11: qty 0.15

## 2015-12-11 MED ORDER — ROCURONIUM BROMIDE 10 MG/ML (PF) SYRINGE
PREFILLED_SYRINGE | INTRAVENOUS | Status: AC
  Filled 2015-12-11: qty 10

## 2015-12-11 MED ORDER — MIDAZOLAM HCL 2 MG/2ML IJ SOLN
INTRAMUSCULAR | Status: DC | PRN
  Administered 2015-12-11: 1 mg via INTRAVENOUS

## 2015-12-11 MED ORDER — LABETALOL HCL 5 MG/ML IV SOLN
INTRAVENOUS | Status: AC
  Filled 2015-12-11: qty 4

## 2015-12-11 MED ORDER — FENTANYL CITRATE (PF) 100 MCG/2ML IJ SOLN
INTRAMUSCULAR | Status: DC | PRN
  Administered 2015-12-11 (×2): 50 ug via INTRAVENOUS

## 2015-12-11 MED ORDER — FLEET ENEMA 7-19 GM/118ML RE ENEM: 1.0000 | ENEMA | Freq: Once | RECTAL | Status: DC | PRN

## 2015-12-11 MED ORDER — CEFAZOLIN SODIUM-DEXTROSE 2-4 GM/100ML-% IV SOLN
INTRAVENOUS | Status: AC
  Filled 2015-12-11: qty 100

## 2015-12-11 MED ORDER — GLYCOPYRROLATE 0.2 MG/ML IJ SOLN
INTRAMUSCULAR | Status: DC | PRN
  Administered 2015-12-11: 0.1 mg via INTRAVENOUS

## 2015-12-11 MED ORDER — ONDANSETRON HCL 4 MG/2ML IJ SOLN
INTRAMUSCULAR | Status: DC | PRN
  Administered 2015-12-11: 4 mg via INTRAVENOUS

## 2015-12-11 MED ORDER — ONDANSETRON HCL 4 MG/2ML IJ SOLN
4.0000 mg | Freq: Four times a day (QID) | INTRAMUSCULAR | Status: DC | PRN
  Administered 2015-12-11: 4 mg via INTRAVENOUS
  Filled 2015-12-11: qty 2

## 2015-12-11 MED ORDER — SUGAMMADEX SODIUM 200 MG/2ML IV SOLN
INTRAVENOUS | Status: DC | PRN
  Administered 2015-12-11: 200 mg via INTRAVENOUS

## 2015-12-11 MED ORDER — MIDAZOLAM HCL 2 MG/2ML IJ SOLN
INTRAMUSCULAR | Status: AC
  Filled 2015-12-11: qty 2

## 2015-12-11 MED ORDER — PHENYLEPHRINE HCL 10 MG/ML IJ SOLN
INTRAVENOUS | Status: DC | PRN
  Administered 2015-12-11: 50 ug/min via INTRAVENOUS

## 2015-12-11 MED ORDER — ASPIRIN EC 325 MG PO TBEC
325.0000 mg | DELAYED_RELEASE_TABLET | Freq: Every day | ORAL | Status: DC
  Administered 2015-12-11: 325 mg via ORAL
  Filled 2015-12-11: qty 1

## 2015-12-11 MED ORDER — POLYETHYLENE GLYCOL 3350 17 G PO PACK: 17.0000 g | PACK | Freq: Every day | ORAL | Status: DC | PRN

## 2015-12-11 MED ORDER — DILTIAZEM HCL ER COATED BEADS 120 MG PO CP24
120.0000 mg | ORAL_CAPSULE | Freq: Every day | ORAL | Status: DC
  Administered 2015-12-11: 120 mg via ORAL
  Filled 2015-12-11: qty 1

## 2015-12-11 SURGICAL SUPPLY — 85 items
AID PSTN UNV HD RSTRNT DISP (MISCELLANEOUS) ×1
BASEPLATE P2 COATD GLND 6.5X30 (Shoulder) IMPLANT
BIT DRILL 5/64X5 DISP (BIT) ×3 IMPLANT
BLADE SAW SAG 73X25 THK (BLADE) ×2
BLADE SAW SGTL 73X25 THK (BLADE) ×1 IMPLANT
BLADE SURG 15 STRL LF DISP TIS (BLADE) ×1 IMPLANT
BLADE SURG 15 STRL SS (BLADE) ×3
BOWL SMART MIX CTS (DISPOSABLE) IMPLANT
BSPLAT GLND 30 STRL LF SHLDR (Shoulder) ×1 IMPLANT
CHLORAPREP W/TINT 26ML (MISCELLANEOUS) ×3 IMPLANT
CLOSURE WOUND 1/2 X4 (GAUZE/BANDAGES/DRESSINGS) ×1
COVER MAYO STAND STRL (DRAPES) IMPLANT
COVER SURGICAL LIGHT HANDLE (MISCELLANEOUS) ×3 IMPLANT
DRAPE INCISE IOBAN 66X45 STRL (DRAPES) ×3 IMPLANT
DRAPE ORTHO SPLIT 77X108 STRL (DRAPES) ×6
DRAPE SURG ORHT 6 SPLT 77X108 (DRAPES) ×2 IMPLANT
DRSG AQUACEL AG ADV 3.5X10 (GAUZE/BANDAGES/DRESSINGS) ×2 IMPLANT
ELECT BLADE 4.0 EZ CLEAN MEGAD (MISCELLANEOUS) ×3
ELECT REM PT RETURN 9FT ADLT (ELECTROSURGICAL) ×3
ELECTRODE BLDE 4.0 EZ CLN MEGD (MISCELLANEOUS) ×1 IMPLANT
ELECTRODE REM PT RTRN 9FT ADLT (ELECTROSURGICAL) ×1 IMPLANT
EVACUATOR 1/8 PVC DRAIN (DRAIN) IMPLANT
GLOVE BIO SURGEON STRL SZ7 (GLOVE) ×3 IMPLANT
GLOVE BIO SURGEON STRL SZ7.5 (GLOVE) ×3 IMPLANT
GLOVE BIOGEL PI IND STRL 7.0 (GLOVE) ×1 IMPLANT
GLOVE BIOGEL PI IND STRL 8 (GLOVE) ×1 IMPLANT
GLOVE BIOGEL PI INDICATOR 7.0 (GLOVE) ×2
GLOVE BIOGEL PI INDICATOR 8 (GLOVE) ×2
GOWN STRL REUS W/ TWL LRG LVL3 (GOWN DISPOSABLE) ×3 IMPLANT
GOWN STRL REUS W/ TWL XL LVL3 (GOWN DISPOSABLE) ×1 IMPLANT
GOWN STRL REUS W/TWL LRG LVL3 (GOWN DISPOSABLE) ×9
GOWN STRL REUS W/TWL XL LVL3 (GOWN DISPOSABLE) ×3
HANDPIECE INTERPULSE COAX TIP (DISPOSABLE) ×3
HOOD PEEL AWAY FLYTE STAYCOOL (MISCELLANEOUS) ×6 IMPLANT
INSERT EPOLY STND HUMERUS 32MM (Shoulder) ×3 IMPLANT
INSERT EPOLYSTD HUMERUS 32MM (Shoulder) IMPLANT
KIT BASIN OR (CUSTOM PROCEDURE TRAY) ×3 IMPLANT
KIT ROOM TURNOVER OR (KITS) ×3 IMPLANT
MANIFOLD NEPTUNE II (INSTRUMENTS) ×3 IMPLANT
NDL HYPO 25GX1X1/2 BEV (NEEDLE) IMPLANT
NDL MAYO TROCAR (NEEDLE) IMPLANT
NEEDLE HYPO 25GX1X1/2 BEV (NEEDLE) IMPLANT
NEEDLE MAYO TROCAR (NEEDLE) IMPLANT
NOZZLE PRISM 8.5MM (MISCELLANEOUS) IMPLANT
NS IRRIG 1000ML POUR BTL (IV SOLUTION) ×5 IMPLANT
P2 COATDE GLNOID BSEPLT 6.5X30 (Shoulder) ×3 IMPLANT
PACK SHOULDER (CUSTOM PROCEDURE TRAY) ×3 IMPLANT
PAD ARMBOARD 7.5X6 YLW CONV (MISCELLANEOUS) ×6 IMPLANT
RESTRAINT HEAD UNIVERSAL NS (MISCELLANEOUS) ×3 IMPLANT
RETRIEVER SUT HEWSON (MISCELLANEOUS) ×3 IMPLANT
SCREW BONE LOCKING RSP 5.0X14 (Screw) ×3 IMPLANT
SCREW BONE LOCKING RSP 5.0X30 (Screw) ×3 IMPLANT
SCREW BONE RSP LOCK 5X14 (Screw) ×1 IMPLANT
SCREW BONE RSP LOCK 5X22 (Screw) IMPLANT
SCREW BONE RSP LOCK 5X26 (Screw) ×1 IMPLANT
SCREW BONE RSP LOCK 5X30 (Screw) IMPLANT
SCREW BONE RSP LOCKING 5.0X26 (Screw) ×3 IMPLANT
SCREW BONE RSP LOCKING 5.0X32 (Screw) ×3 IMPLANT
SCREW RETAIN W/HEAD 4MM OFFSET (Shoulder) ×2 IMPLANT
SET HNDPC FAN SPRY TIP SCT (DISPOSABLE) ×1 IMPLANT
SLING ARM IMMOBILIZER LRG (SOFTGOODS) ×3 IMPLANT
SLING ARM IMMOBILIZER MED (SOFTGOODS) IMPLANT
SPONGE LAP 18X18 X RAY DECT (DISPOSABLE) ×3 IMPLANT
SPONGE LAP 4X18 X RAY DECT (DISPOSABLE) IMPLANT
STEM REV PRIMARY 8X108 (Shoulder) ×2 IMPLANT
STRIP CLOSURE SKIN 1/2X4 (GAUZE/BANDAGES/DRESSINGS) ×2 IMPLANT
SUCTION FRAZIER HANDLE 10FR (MISCELLANEOUS) ×2
SUCTION TUBE FRAZIER 10FR DISP (MISCELLANEOUS) ×1 IMPLANT
SUPPORT WRAP ARM LG (MISCELLANEOUS) ×3 IMPLANT
SUT ETHIBOND NAB CT1 #1 30IN (SUTURE) ×12 IMPLANT
SUT FIBERWIRE #2 38 REV NDL BL (SUTURE) ×3
SUT MNCRL AB 4-0 PS2 18 (SUTURE) ×5 IMPLANT
SUT SILK 2 0 TIES 17X18 (SUTURE)
SUT SILK 2-0 18XBRD TIE BLK (SUTURE) IMPLANT
SUT VIC AB 0 CTB1 27 (SUTURE) ×5 IMPLANT
SUT VIC AB 2-0 CT1 27 (SUTURE) ×6
SUT VIC AB 2-0 CT1 TAPERPNT 27 (SUTURE) ×1 IMPLANT
SUTURE FIBERWR#2 38 REV NDL BL (SUTURE) IMPLANT
SYR CONTROL 10ML LL (SYRINGE) IMPLANT
SYRINGE TOOMEY DISP (SYRINGE) IMPLANT
TAPE FIBER 2MM 7IN #2 BLUE (SUTURE) IMPLANT
TOWEL OR 17X24 6PK STRL BLUE (TOWEL DISPOSABLE) ×3 IMPLANT
TOWEL OR 17X26 10 PK STRL BLUE (TOWEL DISPOSABLE) ×3 IMPLANT
WATER STERILE IRR 1000ML POUR (IV SOLUTION) ×5 IMPLANT
YANKAUER SUCT BULB TIP NO VENT (SUCTIONS) IMPLANT

## 2015-12-11 NOTE — Discharge Instructions (Signed)

## 2015-12-11 NOTE — Op Note (Signed)
Procedure(s): REVERSE SHOULDER ARTHROPLASTY Procedure Note  Jenna Murphy female 82 y.o. 12/11/2015  Procedure(s) and Anesthesia Type:    * RIGHT REVERSE SHOULDER ARTHROPLASTY - Choice   Indications:  82 y.o. 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 Pain and dysfunction interfered with quality of life and nonoperative treatment with activity modification, NSAIDS and injections failed.     Surgeon: Mable Paris   Assistants: Damita Lack PA-C [facility] was present and scrubbed throughout the procedure and was essential in positioning, retraction, exposure, and closure)  Anesthesia: General endotracheal anesthesia with preoperative interscalene block given by the attending anesthesiologist    Procedure Detail  Right REVERSE SHOULDER ARTHROPLASTY   Estimated Blood Loss:  200 mL         Drains: none  Blood Given: none          Specimens: none        Complications:  * No complications entered in OR log *         Disposition: PACU - hemodynamically stable.         Condition: stable      OPERATIVE FINDINGS:  A DJO Altivate pressfit reverse total shoulder arthroplasty was placed with a  size 8 stem, a 32-4 glenosphere, and a standard poly insert. The base plate  fixation was excellent.  PROCEDURE: The patient was identified in the preoperative holding area  where I personally marked the operative site after verifying site, side,  and procedure with the patient. An interscalene block given by  the attending anesthesiologist in the holding area and the patient was taken back to the operating room where all extremities were  carefully padded in position after general anesthesia was induced. She  was placed in a beach-chair position and the operative upper extremity was  prepped and draped in a standard sterile fashion. An approximately 10-  cm incision was made from the tip of the coracoid process to the center   point of the humerus at the level of the axilla. Dissection was carried  down through subcutaneous tissues to the level of the cephalic vein  which was taken laterally with the deltoid. The pectoralis major was  retracted medially. The subdeltoid space was developed and the lateral  edge of the conjoined tendon was identified. The undersurface of  conjoined tendon was palpated and the musculocutaneous nerve was not in  the field. Retractor was placed underneath the conjoined and second  retractor was placed lateral into the deltoid. The circumflex humeral  artery and vessels were identified and clamped and coagulated. The  biceps tendon was tenotomized.  The subscapularis was taken down as a peel with the capsule.  The  joint was then gently externally rotated while the capsule was released  from the humeral neck around to just beyond the 6 o'clock position. At  this point, the joint was dislocated and the humeral head was presented  into the wound. The supraspinatus was noted to be completely torn and retracted. The excessive osteophyte formation was removed with a  large rongeur.  The cutting guide was used to make the appropriate  head cut and the head was saved for potentially bone grafting.  The glenoid was exposed with the arm in an  abducted extended position. The anterior and posterior labrum were  completely excised and the capsule was released circumferentially to  allow for exposure of the glenoid for preparation. The 2.5 mm drill was  placed using the guide in 5-10  inferior angulation and the tap was then advanced in the same hole. Small and large reamers were then used. The tap was then removed and the Metaglene was then screwed in with excellent purchase.  The peripheral guide was then used to drilled measured and filled peripheral locking screws. The size  32-4  glenosphere was then impacted on the [facility] taper and the central screw was placed. The humerus was then again exposed  and the diaphyseal reamers were used followed by the metaphyseal reamers. The final broach was left in place in the proximal trial was placed. The joint was reduced and with this implant it was felt that soft tissue tensioning was appropriate with excellent stability and excellent range of motion. Therefore, final humeral stem was placed press with with bone grafting.  And then the trial polyethylene inserts were tested again and the above implant was felt to be the most appropriate for final insertion. The joint was reduced taken through full range of motion and felt to be stable. Soft tissue tension was appropriate.  The joint was then copiously irrigated with pulse  lavage and the wound was then closed. The subscapularis was repaired to the lesser tuberosity.  Skin was closed with 2-0 Vicryl in a deep dermal layer and 4-0  Monocryl for skin closure. Steri-Strips were applied. Sterile  dressings were then applied as well as a sling. The patient was allowed  to awaken from general anesthesia, transferred to stretcher, and taken  to recovery room in stable condition.   POSTOPERATIVE PLAN: The patient will be kept in the hospital postoperatively  for pain control and therapy.

## 2015-12-11 NOTE — Anesthesia Postprocedure Evaluation (Signed)
Anesthesia Post Note  Patient: Jenna Murphy  Procedure(s) Performed: Procedure(s) (LRB): REVERSE SHOULDER ARTHROPLASTY (Right)  Patient location during evaluation: PACU Anesthesia Type: General and Regional Level of consciousness: awake, awake and alert and oriented Pain management: pain level controlled Vital Signs Assessment: post-procedure vital signs reviewed and stable Respiratory status: spontaneous breathing, nonlabored ventilation and respiratory function stable Anesthetic complications: no    Last Vitals:  Vitals:   12/11/15 0950 12/11/15 1052  BP: (!) 171/84 (!) 176/88  Pulse: (!) 59 (!) 57  Resp: 16 16  Temp:  36.6 C    Last Pain:  Vitals:   12/11/15 1052  TempSrc: Oral                 Slade Pierpoint COKER

## 2015-12-11 NOTE — Anesthesia Preprocedure Evaluation (Signed)
Anesthesia Evaluation  Patient identified by MRN, date of birth, ID band Patient awake    Reviewed: Allergy & Precautions, NPO status , Patient's Chart, lab work & pertinent test results  Airway Mallampati: II  TM Distance: >3 FB Neck ROM: Full    Dental  (+) Teeth Intact, Dental Advisory Given   Pulmonary former smoker,    breath sounds clear to auscultation       Cardiovascular hypertension,  Rhythm:Regular Rate:Abnormal     Neuro/Psych    GI/Hepatic   Endo/Other  diabetes  Renal/GU      Musculoskeletal   Abdominal   Peds  Hematology   Anesthesia Other Findings   Reproductive/Obstetrics                             Anesthesia Physical Anesthesia Plan  ASA: III  Anesthesia Plan: General and Regional   Post-op Pain Management:    Induction: Intravenous  Airway Management Planned: Oral ETT  Additional Equipment:   Intra-op Plan:   Post-operative Plan: Extubation in OR  Informed Consent: I have reviewed the patients History and Physical, chart, labs and discussed the procedure including the risks, benefits and alternatives for the proposed anesthesia with the patient or authorized representative who has indicated his/her understanding and acceptance.   Dental advisory given  Plan Discussed with: CRNA and Anesthesiologist  Anesthesia Plan Comments:         Anesthesia Quick Evaluation

## 2015-12-11 NOTE — Anesthesia Procedure Notes (Addendum)
Anesthesia Regional Block:  Interscalene brachial plexus block  Pre-Anesthetic Checklist: ,, timeout performed, Correct Patient, Correct Site, Correct Laterality, Correct Procedure, Correct Position, site marked, Risks and benefits discussed,  Surgical consent,  Pre-op evaluation,  At surgeon's request and post-op pain management  Laterality: Right  Prep: chloraprep       Needles:  Injection technique: Single-shot  Needle Type: Stimulator Needle - 80      Needle Gauge: 21 and 21 G    Additional Needles:  Procedures: ultrasound guided (picture in chart) Interscalene brachial plexus block Narrative:  Start time: 12/11/2015 7:15 AM End time: 12/11/2015 7:20 AM Injection made incrementally with aspirations every 5 mL.  Performed by: Personally   Additional Notes: 25 cc 0.5% Bupivacaine with 1:200 epi injected easily

## 2015-12-11 NOTE — Anesthesia Procedure Notes (Signed)
Procedure Name: Intubation Date/Time: 12/11/2015 7:43 AM Performed by: Noreene LarssonJOSLIN, DAVID Pre-anesthesia Checklist: Patient identified, Emergency Drugs available, Suction available and Patient being monitored Patient Re-evaluated:Patient Re-evaluated prior to inductionOxygen Delivery Method: Circle system utilized Preoxygenation: Pre-oxygenation with 100% oxygen Intubation Type: IV induction Ventilation: Mask ventilation without difficulty Laryngoscope Size: Mac and 3 Grade View: Grade I Tube type: Oral Tube size: 7.0 mm Number of attempts: 1 Airway Equipment and Method: Stylet Placement Confirmation: ETT inserted through vocal cords under direct vision,  positive ETCO2 and breath sounds checked- equal and bilateral Secured at: 23 cm Tube secured with: Tape Dental Injury: Teeth and Oropharynx as per pre-operative assessment

## 2015-12-11 NOTE — Transfer of Care (Signed)
Immediate Anesthesia Transfer of Care Note  Patient: Jenna RivalJean D Kotch  Procedure(s) Performed: Procedure(s) with comments: REVERSE SHOULDER ARTHROPLASTY (Right) - Right reverse total shoulder arthroplasty  Patient Location: PACU  Anesthesia Type:General  Level of Consciousness: awake, alert  and pateint uncooperative  Airway & Oxygen Therapy: Patient Spontanous Breathing and Patient connected to nasal cannula oxygen  Post-op Assessment: Report given to RN, Post -op Vital signs reviewed and stable, Patient moving all extremities X 4 and Patient able to stick tongue midline  Post vital signs: Reviewed and stable  Last Vitals:  Vitals:   12/11/15 0645  BP: (!) 177/88  Pulse: 85  Resp: 18  Temp: 36.9 C    Last Pain:  Vitals:   12/11/15 0645  TempSrc: Oral         Complications: No apparent anesthesia complications

## 2015-12-11 NOTE — H&P (Signed)
Jenna Murphy is an 82 y.o. female.   Chief Complaint: R shoulder pain and dysfunction HPI: R shoulder endstage arthritis with rotator cuff dysfunction. Failed nonoperative treatment.  Past Medical History:  Diagnosis Date  . Arthritis   . Carpal tunnel syndrome of left wrist   . Diabetes mellitus    not on insulin  . Gallstone pancreatitis 02/2008  . Heart murmur   . Hypertension   . Polio    as child     Past Surgical History:  Procedure Laterality Date  . CHOLECYSTECTOMY    . REVERSE SHOULDER ARTHROPLASTY Left 05/23/2014   Procedure: REVERSE SHOULDER ARTHROPLASTY;  Surgeon: Mable Paris, MD;  Location: [facility] OR;  Service: Orthopedics;  Laterality: Left;  . STERIOD INJECTION Left 05/23/2014   Procedure: STEROID INJECTION THUMB;  Surgeon: Mable Paris, MD;  Location: [facility] OR;  Service: Orthopedics;  Laterality: Left;  Steroid injection left thumb    Family History  Problem Relation Age of Onset  . Heart attack Mother     Died of MI, dec at 42   . Cirrhosis Father     alcohol induced    Social History:  reports that she quit smoking about 47 years ago. Her smoking use included Cigarettes. She has a 20.00 pack-year smoking history. She has never used smokeless tobacco. She reports that she does not drink alcohol or use drugs.  Allergies:  Allergies  Allergen Reactions  . No Known Allergies     Medications Prior to Admission  Medication Sig Dispense Refill  . acetaminophen (TYLENOL) 500 MG tablet Take 500 mg by mouth every 6 (six) hours as needed for moderate pain or headache.    Marland Kitchen atorvastatin (LIPITOR) 20 MG tablet Take 20 mg by mouth daily.    Marland Kitchen CARTIA XT 120 MG 24 hr capsule Take 120 mg by mouth daily.   3  . Insulin Glargine (TOUJEO SOLOSTAR [place]) Inject 15-20 Units into the skin See admin instructions. Inject 15 units subque in the morning and inject 20 units subque in the evening    . metFORMIN (GLUCOPHAGE) 500 MG tablet Take 500 mg by mouth daily.     . valsartan (DIOVAN) 320 MG tablet Take 320 mg by mouth daily.      Results for orders placed or performed during the hospital encounter of 12/11/15 (from the past 48 hour(s))  Urinalysis, Routine w reflex microscopic (not at [facility])     Status: Abnormal   Collection Time: 12/11/15  5:48 AM  Result Value Ref Range   Color, Urine YELLOW YELLOW   APPearance CLEAR CLEAR   Specific Gravity, Urine 1.011 1.005 - 1.030   pH 6.0 5.0 - 8.0   Glucose, UA NEGATIVE NEGATIVE mg/dL   Hgb urine dipstick NEGATIVE NEGATIVE   Bilirubin Urine NEGATIVE NEGATIVE   Ketones, ur NEGATIVE NEGATIVE mg/dL   Protein, ur NEGATIVE NEGATIVE mg/dL   Nitrite NEGATIVE NEGATIVE   Leukocytes, UA TRACE (A) NEGATIVE  Urine microscopic-add on     Status: Abnormal   Collection Time: 12/11/15  5:48 AM  Result Value Ref Range   Squamous Epithelial / LPF 0-5 (A) NONE SEEN   WBC, UA 0-5 0 - 5 WBC/hpf   RBC / HPF NONE SEEN 0 - 5 RBC/hpf   Bacteria, UA RARE (A) NONE SEEN  Glucose, capillary     Status: Abnormal   Collection Time: 12/11/15  6:41 AM  Result Value Ref Range   Glucose-Capillary 130 (H) 65 - 99 mg/dL  No results found.  Review of Systems  All other systems reviewed and are negative.   Blood pressure (!) 177/88, pulse 85, temperature 98.5 F (36.9 C), temperature source Oral, resp. rate 18, height 5' 4.5" (1.638 m), weight 76.3 kg (168 lb 5 oz), SpO2 98 %. Physical Exam  Constitutional: She is oriented to person, place, and time. She appears well-developed and well-nourished.  HENT:  Head: Atraumatic.  Eyes: EOM are normal.  Cardiovascular: Intact distal pulses.   Respiratory: Effort normal.  Musculoskeletal:  R shoulder pain with limited motion.  Neurological: She is alert and oriented to person, place, and time.  Skin: Skin is warm and dry.  Psychiatric: She has a normal mood and affect.     Assessment/Plan R shoulder endstage arthritis with rotator cuff dysfunction. Failed nonoperative  treatment. Plan R reverse TSA Risks / benefits of surgery discussed Consent on chart  NPO for OR Preop antibiotics   Mable ParisHANDLER,Zadkiel Dragan WILLIAM, MD 12/11/2015, 7:11 AM

## 2015-12-12 ENCOUNTER — Encounter (HOSPITAL_COMMUNITY): Payer: Self-pay | Admitting: Orthopedic Surgery

## 2015-12-12 LAB — CBC
HCT: 34.5 % — ABNORMAL LOW (ref 36.0–46.0)
HEMOGLOBIN: 11 g/dL — AB (ref 12.0–15.0)
MCH: 25.3 pg — AB (ref 26.0–34.0)
MCHC: 31.9 g/dL (ref 30.0–36.0)
MCV: 79.5 fL (ref 78.0–100.0)
Platelets: 294 10*3/uL (ref 150–400)
RBC: 4.34 MIL/uL (ref 3.87–5.11)
RDW: 14.9 % (ref 11.5–15.5)
WBC: 11.9 10*3/uL — ABNORMAL HIGH (ref 4.0–10.5)

## 2015-12-12 LAB — BASIC METABOLIC PANEL
Anion gap: 8 (ref 5–15)
BUN: 15 mg/dL (ref 6–20)
CHLORIDE: 106 mmol/L (ref 101–111)
CO2: 24 mmol/L (ref 22–32)
CREATININE: 1.2 mg/dL — AB (ref 0.44–1.00)
Calcium: 9.1 mg/dL (ref 8.9–10.3)
GFR calc Af Amer: 46 mL/min — ABNORMAL LOW (ref >60)
GFR calc non Af Amer: 40 mL/min — ABNORMAL LOW (ref >60)
GLUCOSE: 183 mg/dL — AB (ref 65–99)
Potassium: 4.5 mmol/L (ref 3.5–5.1)
SODIUM: 138 mmol/L (ref 135–145)

## 2015-12-12 LAB — GLUCOSE, CAPILLARY: Glucose-Capillary: 261 mg/dL — ABNORMAL HIGH (ref 65–99)

## 2015-12-12 MED ORDER — DOCUSATE SODIUM 100 MG PO CAPS: 100.0000 mg | ORAL_CAPSULE | Freq: Three times a day (TID) | ORAL | 0 refills | Status: DC | PRN

## 2015-12-12 MED ORDER — HYDROCODONE-ACETAMINOPHEN 5-325 MG PO TABS: 1.0000 | ORAL_TABLET | Freq: Four times a day (QID) | ORAL | 0 refills | Status: DC | PRN

## 2015-12-12 NOTE — Discharge Summary (Signed)
Patient ID: Jenna Murphy MRN: [id number] DOB/AGE: 1928-01-03 82 y.o.  Admit date: 12/11/2015 Discharge date: 12/12/2015  Admission Diagnoses:  Active Problems:   S/p reverse total shoulder arthroplasty   Discharge Diagnoses:  Same  Past Medical History:  Diagnosis Date  . Arthritis   . Carpal tunnel syndrome of left wrist   . Diabetes mellitus    not on insulin  . Gallstone pancreatitis 02/2008  . Heart murmur   . Hypertension   . Polio    as child     Surgeries: Procedure(s): REVERSE SHOULDER ARTHROPLASTY on 12/11/2015   Consultants:   Discharged Condition: Improved  Hospital Course: Jenna Murphy is an 82 y.o. female who was admitted 12/11/2015 for operative treatment of rotator cuff tear arthropathy. Patient has severe unremitting pain that affects sleep, daily activities, and work/hobbies. After pre-op clearance the patient was taken to the operating room on 12/11/2015 and underwent  Procedure(s): REVERSE SHOULDER ARTHROPLASTY.    Patient was given perioperative antibiotics: Anti-infectives    Start     Dose/Rate Route Frequency Ordered Stop   12/11/15 1400  ceFAZolin (ANCEF) IVPB 1 g/50 mL premix     1 g 100 mL/hr over 30 Minutes Intravenous Every 6 hours 12/11/15 1023 12/12/15 0228   12/11/15 0549  ceFAZolin (ANCEF) 2-4 GM/100ML-% IVPB    Comments:  Rosenberger, Meredit: cabinet override      12/11/15 0549 12/11/15 1759   12/11/15 0531  ceFAZolin (ANCEF) IVPB 2g/100 mL premix     2 g 200 mL/hr over 30 Minutes Intravenous On call to O.R. 12/11/15 0531 12/11/15 0745       Patient was given sequential compression devices, early ambulation, and ASA to prevent DVT.  Patient benefited maximally from hospital stay and there were no complications.    Recent vital signs: Patient Vitals for the past 24 hrs:  BP Temp Temp src Pulse Resp SpO2  12/12/15 0535 (!) 152/67 97.6 F (36.4 C) Oral (!) 58 18 92 %  12/12/15 0002 (!) 129/57 98 F (36.7 C) Oral 61 16 93 %   12/11/15 1944 139/73 97.8 F (36.6 C) Oral 64 16 92 %  12/11/15 1052 (!) 176/88 97.8 F (36.6 C) Oral (!) 57 16 92 %  12/11/15 0950 (!) 171/84 - - (!) 59 16 97 %  12/11/15 0945 (!) 167/128 - - (!) 58 (!) 22 97 %  12/11/15 0930 (!) 184/111 - - 82 20 94 %  12/11/15 0918 (!) 205/105 97.9 F (36.6 C) - 91 18 92 %     Recent laboratory studies:  Recent Labs  12/12/15 0435  WBC 11.9*  HGB 11.0*  HCT 34.5*  PLT 294  NA 138  K 4.5  CL 106  CO2 24  BUN 15  CREATININE 1.20*  GLUCOSE 183*  CALCIUM 9.1     Discharge Medications:     Medication List    STOP taking these medications   acetaminophen 500 MG tablet Commonly known as:  TYLENOL     TAKE these medications   atorvastatin 20 MG tablet Commonly known as:  LIPITOR Take 20 mg by mouth daily.   CARTIA XT 120 MG 24 hr capsule Generic drug:  diltiazem Take 120 mg by mouth daily.   docusate sodium 100 MG capsule Commonly known as:  COLACE Take 1 capsule (100 mg total) by mouth 3 (three) times daily as needed.   HYDROcodone-acetaminophen 5-325 MG tablet Commonly known as:  NORCO Take 1-2 tablets by mouth every  6 (six) hours as needed for moderate pain.   metFORMIN 500 MG tablet Commonly known as:  GLUCOPHAGE Take 500 mg by mouth daily.   TOUJEO SOLOSTAR Gene Autry Inject 15-20 Units into the skin See admin instructions. Inject 15 units subque in the morning and inject 20 units subque in the evening   valsartan 320 MG tablet Commonly known as:  DIOVAN Take 320 mg by mouth daily.       Diagnostic Studies: Dg Chest 2 View  Result Date: 12/05/2015 CLINICAL DATA:  Preop study prior right shoulder procedure. EXAM: CHEST  2 VIEW COMPARISON:  PA and lateral chest x-ray dated May 15, 2014 FINDINGS: The lungs are adequately inflated. There is no focal infiltrate. The interstitial markings are coarse though stable. The cardiac silhouette is enlarged but also stable. The pulmonary vascularity is mildly prominent  centrally. There is tortuosity of the descending thoracic aorta. There is calcification wall of the aortic arch. There is a prosthetic reversed left shoulder joint. IMPRESSION: Chronic bronchitic changes, stable. Mild cardiomegaly and central pulmonary vascular prominence. No definite interstitial or alveolar edema. Aortic atherosclerosis. Electronically Signed   By: David  SwazilandJordan M.D.   On: 12/05/2015 09:28   Dg Cervical Spine Complete  Result Date: 11/17/2015 CLINICAL DATA:  82 year old female with right shoulder pain progressive for 1 year, radiating from the neck to the arm. Difficulty lifting arm. Arthritis. Initial encounter. EXAM: CERVICAL SPINE - COMPLETE 4+ VIEW COMPARISON:  Cervical spine series 412 2016. FINDINGS: Prevertebral soft tissue contours remain normal. Chronic straightening of cervical lordosis. Chronic disc space loss and endplate spurring at C6-C7, and to a lesser extent C4-C5. Moderate to severe chronic facet hypertrophy, left greater than right from C4-C6. Bilateral posterior element alignment is within normal limits. Mildly increased dextro convex cervical scoliosis. AP alignment otherwise within normal limits. C1-C2 alignment appears normal. Cervicothoracic junction alignment is within normal limits. Negative lung apices. Left shoulder arthroplasty and right glenohumeral joint degeneration also noted. IMPRESSION: 1.  No acute osseous abnormality identified in the cervical spine. 2. Advanced chronic facet degeneration from C4 to C6, and disc and endplate degeneration at C6-C7. Electronically Signed   By: Odessa FlemingH  Hall M.D.   On: 11/17/2015 09:56   Dg Shoulder Right  Result Date: 11/17/2015 CLINICAL DATA:  82 year old female with right shoulder pain progressive for 1 year, radiating from the neck to the arm. Difficulty lifting arm. Arthritis. Initial encounter. EXAM: RIGHT SHOULDER - 2+ VIEW COMPARISON:  Chest radiographs 05/15/2014. Right shoulder series 10/25/2005 FINDINGS: No glenohumeral  joint dislocation. There is moderate to severe glenohumeral joint space loss. Humeral head degenerative osteophytosis has significantly progressed since 2007. Right clavicle and scapula appear intact. Chronic right AC joint degeneration. Visible right ribs and lung parenchyma appear stable. Calcified aortic atherosclerosis. IMPRESSION: 1. Progressed chronic right shoulder degeneration. No acute osseous abnormality identified. 2.  Calcified aortic atherosclerosis. Electronically Signed   By: Odessa FlemingH  Hall M.D.   On: 11/17/2015 09:54   Dg Shoulder Right Port  Result Date: 12/11/2015 CLINICAL DATA:  Status post reverse shoulder arthroplasty EXAM: PORTABLE RIGHT SHOULDER - 1 VIEW COMPARISON:  Right shoulder radiographs November 17, 2015 FINDINGS: Frontal view obtained. Patient has had total shoulder replacement on the right with prosthetic components appearing well seated. No acute fracture or dislocation. Osteoarthritic change remains at the acromioclavicular joint. There is atelectasis in the right lung base. IMPRESSION: On single frontal view, prosthetic components appear well seated. No acute fracture or dislocation. There is osteoarthritic change in the acromioclavicular  joint, stable. Electronically Signed   By: Bretta Bang III M.D.   On: 12/11/2015 10:14    Disposition: 01-Home or Self Care  Discharge Instructions    Call MD / Call 911    Complete by:  As directed   If you experience chest pain or shortness of breath, CALL 911 and be transported to the hospital emergency room.  If you develope a fever above 101 F, pus (white drainage) or increased drainage or redness at the wound, or calf pain, call your surgeon's office.   Constipation Prevention    Complete by:  As directed   Drink plenty of fluids.  Prune juice may be helpful.  You may use a stool softener, such as Colace (over the counter) 100 mg twice a day.  Use MiraLax (over the counter) for constipation as needed.   Diet - low sodium heart  healthy    Complete by:  As directed   Increase activity slowly as tolerated    Complete by:  As directed      Follow-up Information    Mable Paris, MD. Schedule an appointment as soon as possible for a visit in 2 weeks.   Specialty:  Orthopedic Surgery Contact information: [address] [place] [address] [phone]            Signed: Jiles Harold 12/12/2015, 8:07 AM

## 2015-12-12 NOTE — Progress Notes (Signed)
   PATIENT ID: Jenna RivalJean D Buskirk   1 Day Post-Op Procedure(s) (LRB): REVERSE SHOULDER ARTHROPLASTY (Right)  Subjective: Reports no pain at all. Reports block has worn off. No complaints or concerns. Very happy and ready to go home today.   Objective:  Vitals:   12/12/15 0002 12/12/15 0535  BP: (!) 129/57 (!) 152/67  Pulse: 61 (!) 58  Resp: 16 18  Temp: 98 F (36.7 C) 97.6 F (36.4 C)     R UE dressing c/d/i Wiggles fingers, distally NVI  Labs:   Recent Labs  12/12/15 0435  HGB 11.0*   Recent Labs  12/12/15 0435  WBC 11.9*  RBC 4.34  HCT 34.5*  PLT 294   Recent Labs  12/12/15 0435  NA 138  K 4.5  CL 106  CO2 24  BUN 15  CREATININE 1.20*  GLUCOSE 183*  CALCIUM 9.1    Assessment and Plan: 1 day s/p R reverse TSA Sling OT today- hand wrist elbow ROM only D/c home today, script for norco Minimize pain medication Fu with Dr. Ave Filterhandler in 2 weeks  VTE proph: ASA daily, SCDs

## 2015-12-12 NOTE — Evaluation (Signed)
Occupational Therapy Evaluation and Discharge Patient Details Name: Renee RivalJean D Zingale MRN: [id number] DOB: 09/14/1927 Today's Date: 12/12/2015    History of Present Illness s/p R reverse TSA, PMH: R TSA   Clinical Impression   Provided pt and daughter handout and reviewed positioning R UE in bed and chair, sling use, NWB status, no shoulder movement, AROM R elbow>hand and compensatory strategies for ADL. Pt and daughter verbalizing and demonstrating understanding. Pt ready for d/c.    Follow Up Recommendations  No OT follow up;Supervision/Assistance - 24 hour    Equipment Recommendations  None recommended by OT    Recommendations for Other Services       Precautions / Restrictions Precautions Precautions: Fall Restrictions Weight Bearing Restrictions: Yes RUE Weight Bearing: Non weight bearing      Mobility Bed Mobility               General bed mobility comments: pt in chair, recommended pt get up to the L side of her bed to avoid pushing up on R arm  Transfers Overall transfer level: Modified independent Equipment used: Straight cane                  Balance                                            ADL Overall ADL's : Needs assistance/impaired Eating/Feeding: Independent;Sitting Eating/Feeding Details (indicate cue type and reason): using L hand Grooming: Wash/dry hands;Wash/dry face;Standing;Supervision/safety   Upper Body Bathing: Minimal assitance;Sitting Upper Body Bathing Details (indicate cue type and reason): for back Lower Body Bathing: Supervison/ safety;Sitting/lateral leans   Upper Body Dressing : Minimal assistance;Sitting   Lower Body Dressing: Minimal assistance;Sit to/from stand   Toilet Transfer: Supervision/safety;Ambulation (cane)   Toileting- Clothing Manipulation and Hygiene: Supervision/safety;Sit to/from stand       Functional mobility during ADLs: Supervision/safety;Cane General ADL Comments:  Educated pt in compensatory strategies for ADL and assisted pt with ADL to reinforce. Instructed pt and daughter in use of sling and positioning R UE in bed and chair. Pt instructed in NWB status and to wear sling except during ADL and exercises. Pt and daughter also provided handout.      Vision     Perception     Praxis      Pertinent Vitals/Pain Pain Assessment: Faces Faces Pain Scale: Hurts a little bit Pain Location: R shoulder Pain Descriptors / Indicators: Sore Pain Intervention(s): Ice applied;Repositioned;Monitored during session     Hand Dominance Right   Extremity/Trunk Assessment Upper Extremity Assessment Upper Extremity Assessment: RUE deficits/detail RUE Deficits / Details: performed AROM elbow to hand RUE: Unable to fully assess due to immobilization RUE Coordination: decreased gross motor   Lower Extremity Assessment Lower Extremity Assessment: Generalized weakness       Communication Communication Communication: No difficulties   Cognition Arousal/Alertness: Awake/alert Behavior During Therapy: Impulsive Overall Cognitive Status: Within Functional Limits for tasks assessed       Memory: Decreased short-term memory             General Comments       Exercises       Shoulder Instructions      Home Living Family/patient expects to be discharged to:: Private residence Living Arrangements: Alone Available Help at Discharge: Family;Available 24 hours/day (for 2 weeks)  Home Equipment: Cane - single point          Prior Functioning/Environment Level of Independence: Independent        Comments: pt drives, loves to go to church    OT Diagnosis: Generalized weakness;Acute pain   OT Problem List:     OT Treatment/Interventions:      OT Goals(Current goals can be found in the care plan section) Acute Rehab OT Goals Patient Stated Goal: to go to church  OT Frequency:     Barriers to D/C:             Co-evaluation              End of Session Equipment Utilized During Treatment:  (sling, cane)  Activity Tolerance: Patient tolerated treatment well Patient left: in chair;with call bell/phone within reach;with family/visitor present   Time: 0822-0906 OT Time Calculation (min): 44 min Charges:  OT General Charges $OT Visit: 1 Procedure OT Evaluation $OT Eval Moderate Complexity: 1 Procedure OT Treatments $Self Care/Home Management : 8-22 mins $Therapeutic Exercise: 8-22 mins G-Codes:    Evern Bio 12/12/2015, 9:17 AM  [phone]

## 2015-12-12 NOTE — Progress Notes (Signed)
Reviewed discharge papers with Ms Mayford KnifeWilliams and her daughter with full understanding

## 2016-03-15 ENCOUNTER — Ambulatory Visit (INDEPENDENT_AMBULATORY_CARE_PROVIDER_SITE_OTHER): Payer: Medicare Other | Admitting: Physician Assistant

## 2016-03-15 DIAGNOSIS — Z23 Encounter for immunization: Secondary | ICD-10-CM | POA: Diagnosis not present

## 2016-09-23 ENCOUNTER — Encounter (HOSPITAL_COMMUNITY): Payer: Self-pay

## 2016-09-23 ENCOUNTER — Emergency Department (HOSPITAL_COMMUNITY): Payer: Medicare Other

## 2016-09-23 ENCOUNTER — Emergency Department (HOSPITAL_COMMUNITY)
Admission: EM | Admit: 2016-09-23 | Discharge: 2016-09-23 | Disposition: A | Discharge status: Home / Self Care | Payer: Medicare Other | Attending: Emergency Medicine | Admitting: Emergency Medicine

## 2016-09-23 DIAGNOSIS — Z794 Long term (current) use of insulin: Secondary | ICD-10-CM | POA: Insufficient documentation

## 2016-09-23 DIAGNOSIS — Z87891 Personal history of nicotine dependence: Secondary | ICD-10-CM | POA: Diagnosis not present

## 2016-09-23 DIAGNOSIS — R0789 Other chest pain: Secondary | ICD-10-CM

## 2016-09-23 DIAGNOSIS — Z79899 Other long term (current) drug therapy: Secondary | ICD-10-CM | POA: Diagnosis not present

## 2016-09-23 DIAGNOSIS — M778 Other enthesopathies, not elsewhere classified: Secondary | ICD-10-CM

## 2016-09-23 DIAGNOSIS — M7581 Other shoulder lesions, right shoulder: Secondary | ICD-10-CM | POA: Diagnosis not present

## 2016-09-23 DIAGNOSIS — I10 Essential (primary) hypertension: Secondary | ICD-10-CM | POA: Diagnosis not present

## 2016-09-23 DIAGNOSIS — Z7984 Long term (current) use of oral hypoglycemic drugs: Secondary | ICD-10-CM | POA: Diagnosis not present

## 2016-09-23 DIAGNOSIS — M79601 Pain in right arm: Secondary | ICD-10-CM | POA: Diagnosis present

## 2016-09-23 DIAGNOSIS — E119 Type 2 diabetes mellitus without complications: Secondary | ICD-10-CM | POA: Insufficient documentation

## 2016-09-23 LAB — I-STAT CHEM 8, ED
BUN: 14 mg/dL (ref 6–20)
CHLORIDE: 106 mmol/L (ref 101–111)
CREATININE: 0.9 mg/dL (ref 0.44–1.00)
Calcium, Ion: 1.15 mmol/L (ref 1.15–1.40)
GLUCOSE: 142 mg/dL — AB (ref 65–99)
HEMATOCRIT: 43 % (ref 36.0–46.0)
HEMOGLOBIN: 14.6 g/dL (ref 12.0–15.0)
POTASSIUM: 4 mmol/L (ref 3.5–5.1)
Sodium: 139 mmol/L (ref 135–145)
TCO2: 26 mmol/L (ref 0–100)

## 2016-09-23 LAB — CBC WITH DIFFERENTIAL/PLATELET
BASOS ABS: 0 10*3/uL (ref 0.0–0.1)
Basophils Relative: 0 %
Eosinophils Absolute: 0.1 10*3/uL (ref 0.0–0.7)
Eosinophils Relative: 1 %
HEMATOCRIT: 40.8 % (ref 36.0–46.0)
Hemoglobin: 13.2 g/dL (ref 12.0–15.0)
LYMPHS ABS: 5.1 10*3/uL — AB (ref 0.7–4.0)
LYMPHS PCT: 54 %
MCH: 25.5 pg — AB (ref 26.0–34.0)
MCHC: 32.4 g/dL (ref 30.0–36.0)
MCV: 78.8 fL (ref 78.0–100.0)
MONO ABS: 0.6 10*3/uL (ref 0.1–1.0)
MONOS PCT: 7 %
NEUTROS ABS: 3.6 10*3/uL (ref 1.7–7.7)
Neutrophils Relative %: 38 %
Platelets: 273 10*3/uL (ref 150–400)
RBC: 5.18 MIL/uL — ABNORMAL HIGH (ref 3.87–5.11)
RDW: 14.3 % (ref 11.5–15.5)
WBC: 9.4 10*3/uL (ref 4.0–10.5)

## 2016-09-23 LAB — TROPONIN I: Troponin I: <0.03 ng/mL (ref <0.03)

## 2016-09-23 LAB — BRAIN NATRIURETIC PEPTIDE: B Natriuretic Peptide: 79.7 pg/mL (ref 0.0–100.0)

## 2016-09-23 MED ORDER — DILTIAZEM HCL ER COATED BEADS 180 MG PO CP24
180.0000 mg | ORAL_CAPSULE | Freq: Every day | ORAL | Status: DC
  Administered 2016-09-23: 180 mg via ORAL
  Filled 2016-09-23: qty 1

## 2016-09-23 MED ORDER — IRBESARTAN 300 MG PO TABS
300.0000 mg | ORAL_TABLET | Freq: Every day | ORAL | Status: DC
  Administered 2016-09-23: 300 mg via ORAL
  Filled 2016-09-23: qty 1

## 2016-09-23 MED ORDER — IBUPROFEN 400 MG PO TABS: 400.0000 mg | ORAL_TABLET | Freq: Three times a day (TID) | ORAL | 0 refills | Status: DC | PRN

## 2016-09-23 MED ORDER — HYDROCHLOROTHIAZIDE 25 MG PO TABS
25.0000 mg | ORAL_TABLET | Freq: Every day | ORAL | Status: DC
  Administered 2016-09-23: 25 mg via ORAL
  Filled 2016-09-23: qty 1

## 2016-09-23 MED ORDER — IBUPROFEN 400 MG PO TABS
400.0000 mg | ORAL_TABLET | Freq: Once | ORAL | Status: AC
  Administered 2016-09-23: 400 mg via ORAL
  Filled 2016-09-23: qty 1

## 2016-09-23 MED ORDER — VALSARTAN-HYDROCHLOROTHIAZIDE 320-25 MG PO TABS: 1.0000 | ORAL_TABLET | Freq: Every day | ORAL | Status: DC

## 2016-09-23 NOTE — ED Notes (Signed)
Care handoff to Jennifer RN 

## 2016-09-23 NOTE — ED Notes (Signed)
EDP remains at bedside 

## 2016-09-23 NOTE — ED Notes (Signed)
ED Provider at bedside. 

## 2016-09-23 NOTE — ED Notes (Signed)
Returns from XR

## 2016-09-23 NOTE — ED Triage Notes (Signed)
Patient complains of right arm pain with any ROM. Thinks she injured when she opened a pull top on can, no neuro deficits

## 2016-09-23 NOTE — ED Notes (Signed)
EDP to put in order for BP meds

## 2016-09-23 NOTE — ED Notes (Signed)
PT assisted to restroom via wheelchair. PT returned to bed and placed on monitor

## 2016-09-23 NOTE — Discharge Instructions (Signed)
Please see your orthopedic doctor soon. Take the motrin as requested and ice the shoulder 3 or 4 times a day, and do simple range of motion exercises.

## 2016-09-27 NOTE — ED Provider Notes (Signed)
[facility]-EMERGENCY DEPT Provider Note   CSN: [id number] Arrival date & time: 09/23/16  1317     History   Chief Complaint No chief complaint on file.   HPI Jenna Murphy is a 83 y.o. female.  HPI  Pt comes in with cc of R arm pain. Pt reports that she has been having R arm pain over the past few days. The symptoms have progressively gotten worse, and now with any movement her pain is excruciating. Pt has hx of DM. She has hx of shoulder repair bilaterally. Pt denies n/v/f/c. Pt denies any trauma.   Past Medical History:  Diagnosis Date  . Arthritis   . Carpal tunnel syndrome of left wrist   . Diabetes mellitus    not on insulin  . Gallstone pancreatitis 02/2008  . Heart murmur   . Hypertension   . Polio    as child     Patient Active Problem List   Diagnosis Date Noted  . 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 Chest pain 05/22/2011  . Hypertension 05/22/2011    Past Surgical History:  Procedure Laterality Date  . CHOLECYSTECTOMY    . REVERSE SHOULDER ARTHROPLASTY Left 05/23/2014   Procedure: REVERSE SHOULDER ARTHROPLASTY;  Surgeon: Mable Paris, MD;  Location: [facility] OR;  Service: Orthopedics;  Laterality: Left;  . REVERSE SHOULDER ARTHROPLASTY Right 12/11/2015   Procedure: REVERSE SHOULDER ARTHROPLASTY;  Surgeon: Jones Broom, MD;  Location: [facility] OR;  Service: Orthopedics;  Laterality: Right;  Right reverse total shoulder arthroplasty  . STERIOD INJECTION Left 05/23/2014   Procedure: STEROID INJECTION THUMB;  Surgeon: Mable Paris, MD;  Location: [facility] OR;  Service: Orthopedics;  Laterality: Left;  Steroid injection left thumb    OB History    No data available       Home Medications    Prior to Admission medications   Medication Sig Start Date End Date Taking? Authorizing Provider  atorvastatin (LIPITOR) 20 MG tablet Take 20 mg by mouth daily.   Yes [provider]  CARTIA XT 180 MG 24 hr capsule Take 180 mg by mouth  daily. 09/20/16  Yes [provider]  Insulin Glargine (TOUJEO SOLOSTAR Jerome) Inject 15-20 Units into the skin See admin instructions. Inject 15 units subque in the morning and inject 20 units subque in the evening   Yes [provider]  metFORMIN (GLUCOPHAGE) 500 MG tablet Take 500 mg by mouth 2 (two) times daily with a meal.    Yes [provider]  Multiple Vitamins-Calcium (ONE-A-DAY WOMENS FORMULA) TABS Take 1 tablet by mouth.   Yes [provider]  valsartan-hydrochlorothiazide (DIOVAN-HCT) 320-25 MG tablet Take 1 tablet by mouth daily. 09/21/16  Yes [provider]  docusate sodium (COLACE) 100 MG capsule Take 1 capsule (100 mg total) by mouth 3 (three) times daily as needed. Patient not taking: Reported on 09/23/2016 12/12/15   Jiles Harold, PA-C  HYDROcodone-acetaminophen (NORCO) 5-325 MG tablet Take 1-2 tablets by mouth every 6 (six) hours as needed for moderate pain. Patient not taking: Reported on 09/23/2016 12/12/15   Jiles Harold, PA-C  ibuprofen (ADVIL,MOTRIN) 400 MG tablet Take 1 tablet (400 mg total) by mouth every 8 (eight) hours as needed for mild pain. 09/23/16   Derwood Kaplan, MD    Family History Family History  Problem Relation Age of Onset  . Heart attack Mother        Died of MI, dec at 61   . Cirrhosis Father  alcohol induced     Social History Social History  Substance Use Topics  . Smoking status: Former Smoker    Packs/day: 1.00    Years: 20.00    Types: Cigarettes    Quit date: 04/12/1968  . Smokeless tobacco: Never Used  . Alcohol use No     Allergies   No known allergies   Review of Systems Review of Systems  Constitutional: Positive for activity change.  Respiratory: Negative for cough and shortness of breath.   Cardiovascular: Negative for chest pain.  Musculoskeletal: Positive for arthralgias.  Allergic/Immunologic: Positive for immunocompromised state.        Physical  Exam Updated Vital Signs BP (!) 161/94   Pulse 80   Temp 97.9 F (36.6 C) (Oral)   Resp (!) 21   SpO2 96%   Physical Exam  Constitutional: She is oriented to person, place, and time. She appears well-developed.  HENT:  Head: Normocephalic and atraumatic.  Eyes: EOM are normal.  Neck: Normal range of motion. Neck supple.  Cardiovascular: Normal rate.   Pulmonary/Chest: Effort normal.  Abdominal: Bowel sounds are normal.  Musculoskeletal: She exhibits edema and tenderness.  R sided shoulder swelling is mild, there is no warmth to touch or erythema. Pt has tenderness with abduction and internal rotation of the shoulder on the R side. Pt's is able to actively abduct the shoulder until the plane of the shoulder.  Neurological: She is alert and oriented to person, place, and time.  Skin: Skin is warm and dry.  Nursing note and vitals reviewed.    ED Treatments / Results  Labs (all labs ordered are listed, but only abnormal results are displayed) Labs Reviewed  CBC WITH DIFFERENTIAL/PLATELET - Abnormal; Notable for the following:       Result Value   RBC 5.18 (*)    MCH 25.5 (*)    Lymphs Abs 5.1 (*)    All other components within normal limits  I-STAT CHEM 8, ED - Abnormal; Notable for the following:    Glucose, Bld 142 (*)    All other components within normal limits  TROPONIN I  BRAIN NATRIURETIC PEPTIDE  TROPONIN I    EKG  EKG Interpretation None       Radiology No results found.  Procedures Procedures (including critical care time)  Medications Ordered in ED Medications  ibuprofen (ADVIL,MOTRIN) tablet 400 mg (400 mg Oral Given 09/23/16 1705)     Initial Impression / Assessment and Plan / ED Course  I have reviewed the triage vital signs and the nursing notes.  Pertinent labs & imaging results that were available during my care of the patient were reviewed by me and considered in my medical decision making (see chart for details).      Pt comes in  with R shoulder pain. Pt has hx of DM and she has prosthesis on that side - so we considered septic arthritis in our ddx. However, pt has no callor or rubor over the shoulder, and she is able to actively move the shoulder and tolerate passive ROM on our exam, so I doubt that she has a septic joint at this time. Clinically, she likely has some tendonitis or soft tissue related problem. I have advised that pt see her orthopedist in a week time. She will return to the ER if the pain gets worse, there is fevers / redness at the shoulder site. For her atypical chest pain - trops x 2 ordered.   Final Clinical Impressions(s) /  ED Diagnoses   Final diagnoses:  Right shoulder tendinitis  Atypical chest pain    New Prescriptions Discharge Medication List as of 09/23/2016  8:09 PM    START taking these medications   Details  ibuprofen (ADVIL,MOTRIN) 400 MG tablet Take 1 tablet (400 mg total) by mouth every 8 (eight) hours as needed for mild pain., Starting Thu 09/23/2016, Print         Derwood KaplanNanavati, Pearlina Friedly, MD 09/27/16 [phone]

## 2017-01-24 ENCOUNTER — Ambulatory Visit (INDEPENDENT_AMBULATORY_CARE_PROVIDER_SITE_OTHER): Payer: Medicare Other | Admitting: Family Medicine

## 2017-01-24 DIAGNOSIS — Z23 Encounter for immunization: Secondary | ICD-10-CM

## 2017-01-26 NOTE — Progress Notes (Signed)
Flu vaccine only

## 2017-12-02 ENCOUNTER — Emergency Department (HOSPITAL_COMMUNITY)
Admission: EM | Admit: 2017-12-02 | Discharge: 2017-12-02 | Disposition: A | Discharge status: Home / Self Care | Payer: Medicare Other | Attending: Emergency Medicine | Admitting: Emergency Medicine

## 2017-12-02 ENCOUNTER — Emergency Department (HOSPITAL_COMMUNITY): Payer: Medicare Other

## 2017-12-02 ENCOUNTER — Encounter (HOSPITAL_COMMUNITY): Payer: Self-pay | Admitting: *Deleted

## 2017-12-02 DIAGNOSIS — I10 Essential (primary) hypertension: Secondary | ICD-10-CM | POA: Diagnosis not present

## 2017-12-02 DIAGNOSIS — Y9301 Activity, walking, marching and hiking: Secondary | ICD-10-CM | POA: Diagnosis not present

## 2017-12-02 DIAGNOSIS — Y999 Unspecified external cause status: Secondary | ICD-10-CM | POA: Diagnosis not present

## 2017-12-02 DIAGNOSIS — W101XXA Fall (on)(from) sidewalk curb, initial encounter: Secondary | ICD-10-CM | POA: Insufficient documentation

## 2017-12-02 DIAGNOSIS — Z96611 Presence of right artificial shoulder joint: Secondary | ICD-10-CM | POA: Insufficient documentation

## 2017-12-02 DIAGNOSIS — Y929 Unspecified place or not applicable: Secondary | ICD-10-CM | POA: Insufficient documentation

## 2017-12-02 DIAGNOSIS — S298XXA Other specified injuries of thorax, initial encounter: Secondary | ICD-10-CM

## 2017-12-02 DIAGNOSIS — Z87891 Personal history of nicotine dependence: Secondary | ICD-10-CM | POA: Insufficient documentation

## 2017-12-02 DIAGNOSIS — Z794 Long term (current) use of insulin: Secondary | ICD-10-CM | POA: Diagnosis not present

## 2017-12-02 DIAGNOSIS — Z79899 Other long term (current) drug therapy: Secondary | ICD-10-CM | POA: Diagnosis not present

## 2017-12-02 DIAGNOSIS — E119 Type 2 diabetes mellitus without complications: Secondary | ICD-10-CM | POA: Insufficient documentation

## 2017-12-02 DIAGNOSIS — W010XXA Fall on same level from slipping, tripping and stumbling without subsequent striking against object, initial encounter: Secondary | ICD-10-CM

## 2017-12-02 DIAGNOSIS — S29001A Unspecified injury of muscle and tendon of front wall of thorax, initial encounter: Secondary | ICD-10-CM | POA: Diagnosis present

## 2017-12-02 MED ORDER — NAPROXEN 375 MG PO TABS: ORAL_TABLET | ORAL | 0 refills | Status: DC

## 2017-12-02 MED ORDER — HYDROCODONE-ACETAMINOPHEN 5-325 MG PO TABS: 0.5000 | ORAL_TABLET | ORAL | 0 refills | Status: DC | PRN

## 2017-12-02 NOTE — ED Triage Notes (Signed)
Pt sts she fell Sunday evening coming back from church, it took her a while to get up from the parking lot where she fell and get to her apartement. Pt sts she fell on her right side, she c/o right chest pain worse with taking a deep breath, tender to touch. Denies head injury.

## 2017-12-02 NOTE — ED Provider Notes (Addendum)
[facility] Provider Note: Jenna DellJ. Lane Wyatte Dames, MD, FACEP  CSN: [id number] MRN: [id number] ARRIVAL: 12/02/17 at 0008 ROOM: WA25/WA25   CHIEF COMPLAINT  Chest Injury   HISTORY OF PRESENT ILLNESS  12/02/17 12:38 AM Jenna Murphy is a 84 y.o. female who tripped on a curb the evening before last.  She landed on her right side.  It took her about 5 minutes to get back up.  She did not have any immediate pain but developed pain in her right parasternal region yesterday.  She continues to have pain there, which she rates as a 5 out of 10.  Pain is worse with movement or palpation.  She denies injury elsewhere.  She did take ibuprofen without adequate relief.    Past Medical History:  Diagnosis Date  . Arthritis   . Carpal tunnel syndrome of left wrist   . Diabetes mellitus    not on insulin  . Gallstone pancreatitis 02/2008  . Heart murmur   . Hypertension   . Polio    as child     Past Surgical History:  Procedure Laterality Date  . CHOLECYSTECTOMY    . REVERSE SHOULDER ARTHROPLASTY Left 05/23/2014   Procedure: REVERSE SHOULDER ARTHROPLASTY;  Surgeon: Mable ParisJustin William Chandler, MD;  [facility];  Service: Orthopedics;  Laterality: Left;  . REVERSE SHOULDER ARTHROPLASTY Right 12/11/2015   Procedure: REVERSE SHOULDER ARTHROPLASTY;  Surgeon: Jones BroomJustin Chandler, MD;  Location: [facility] OR;  Service: Orthopedics;  Laterality: Right;  Right reverse total shoulder arthroplasty  . STERIOD INJECTION Left 05/23/2014   Procedure: STEROID INJECTION THUMB;  Surgeon: Mable ParisJustin William Chandler, MD;  [facility];  Service: Orthopedics;  Laterality: Left;  Steroid injection left thumb    Family History  Problem Relation Age of Onset  . Heart attack Mother        Died of MI, dec at 6783   . Cirrhosis Father        alcohol induced     Social History   Tobacco Use  . Smoking status: Former Smoker    Packs/day: 1.00    Years: 20.00    Pack years: 20.00    Types: Cigarettes    Last attempt to  quit: 04/12/1968    Years since quitting: 49.6  . Smokeless tobacco: Never Used  Substance Use Topics  . Alcohol use: No    Alcohol/week: 0.0 standard drinks  . Drug use: No    Prior to Admission medications   Medication Sig Start Date End Date Taking? Authorizing Provider  atorvastatin (LIPITOR) 20 MG tablet Take 20 mg by mouth daily.    [provider]  CARTIA XT 180 MG 24 hr capsule Take 180 mg by mouth daily. 09/20/16   [provider]  docusate sodium (COLACE) 100 MG capsule Take 1 capsule (100 mg total) by mouth 3 (three) times daily as needed. Patient not taking: Reported on 09/23/2016 12/12/15   Jiles HaroldLaliberte, Danielle, PA-C  HYDROcodone-acetaminophen (NORCO) 5-325 MG tablet Take 1-2 tablets by mouth every 6 (six) hours as needed for moderate pain. Patient not taking: Reported on 09/23/2016 12/12/15   Jiles HaroldLaliberte, Danielle, PA-C  ibuprofen (ADVIL,MOTRIN) 400 MG tablet Take 1 tablet (400 mg total) by mouth every 8 (eight) hours as needed for mild pain. 09/23/16   Derwood KaplanNanavati, Ankit, MD  Insulin Glargine (TOUJEO SOLOSTAR [place]) Inject 15-20 Units into the skin See admin instructions. Inject 15 units subque in the morning and inject 20 units subque in the evening    [provider]  metFORMIN (GLUCOPHAGE) 500 MG tablet Take 500 mg by mouth 2 (two) times daily with a meal.     [provider]  Multiple Vitamins-Calcium (ONE-A-DAY WOMENS FORMULA) TABS Take 1 tablet by mouth.    [provider]  valsartan-hydrochlorothiazide (DIOVAN-HCT) 320-25 MG tablet Take 1 tablet by mouth daily. 09/21/16   [provider]    Allergies No known allergies   REVIEW OF SYSTEMS  Negative except as noted here or in the History of Present Illness.   PHYSICAL EXAMINATION  Initial Vital Signs Blood pressure (!) 181/82, pulse 65, temperature 98.2 F (36.8 C), resp. rate (!) 22, SpO2 98 %.  Examination General: Well-developed, well-nourished female in no acute  distress; appearance consistent with age of record HENT: normocephalic; atraumatic Eyes: pupils equal, round and reactive to light; extraocular muscles intact Neck: supple; nontender Heart: regular rate and rhythm Lungs: clear to auscultation bilaterally Chest: Right parasternal tenderness without deformity or crepitus Abdomen: soft; nondistended; nontender; bowel sounds present Extremities: No deformity; full range of motion; pulses normal Neurologic: Awake, alert and oriented; motor function intact in all extremities and symmetric; no facial droop Skin: Warm and dry Psychiatric: Normal mood and affect   RESULTS  Summary of this visit's results, reviewed by myself:   EKG Interpretation  Date/Time:    Ventricular Rate:    PR Interval:    QRS Duration:   QT Interval:    QTC Calculation:   R Axis:     Text Interpretation:        Laboratory Studies: No results found for this or any previous visit (from the past 24 hour(s)). Imaging Studies: Dg Ribs Unilateral W/chest Right  Result Date: 12/02/2017 CLINICAL DATA:  Progressive right rib pain after fall 2 days ago. Increasing shortness of breath. EXAM: RIGHT RIBS AND CHEST - 3+ VIEW COMPARISON:  Chest radiograph 12/05/2015 FINDINGS: No visualized rib fracture. BB localizing site of pain overlies anterior ribs, which can be difficult to visualize radiographically. No pneumothorax or focal airspace disease to suggest pulmonary contusion. No pleural effusion. Unchanged cardiomegaly and chronic bronchitic change. Bilateral reverse shoulder arthroplasties. IMPRESSION: No rib fracture or pulmonary complication. Electronically Signed   By: Rubye Oaks M.D.   On: 12/02/2017 01:10    ED COURSE and MDM  Nursing notes and initial vitals signs, including pulse oximetry, reviewed.  Vitals:   12/02/17 0028  BP: (!) 181/82  Pulse: 65  Resp: (!) 22  Temp: 98.2 F (36.8 C)  SpO2: 98%   The patient is fearful of narcotics and  requesting Aleve.  She has taken hydrocodone in the past however.  We will provide incentive spirometry to help prevent pneumonia or atelectasis.  Patient advised to return for shortness of breath, fever or uncontrolled pain.  PROCEDURES    ED DIAGNOSES     ICD-10-CM   1. Blunt chest trauma, initial encounter S29.8XXA   2. Fall on same level from slipping, tripping or stumbling, initial encounter W01.Olin Hauser, MD 12/02/17 0128    Paula Libra, MD 12/02/17 0129    Paula Libra, MD 12/02/17 1610

## 2018-01-22 IMAGING — DX DG CERVICAL SPINE COMPLETE 4+V
6 series · 6 of 6 positions shown · non-contrast
Comparison: Cervical spine series [DATE].

CLINICAL DATA: 88-year-old female with right shoulder pain
progressive for 1 year, radiating from the neck to the arm.
Difficulty lifting arm. Arthritis. Initial encounter.

EXAM:
CERVICAL SPINE - COMPLETE 4+ VIEW

[c-spine lat]
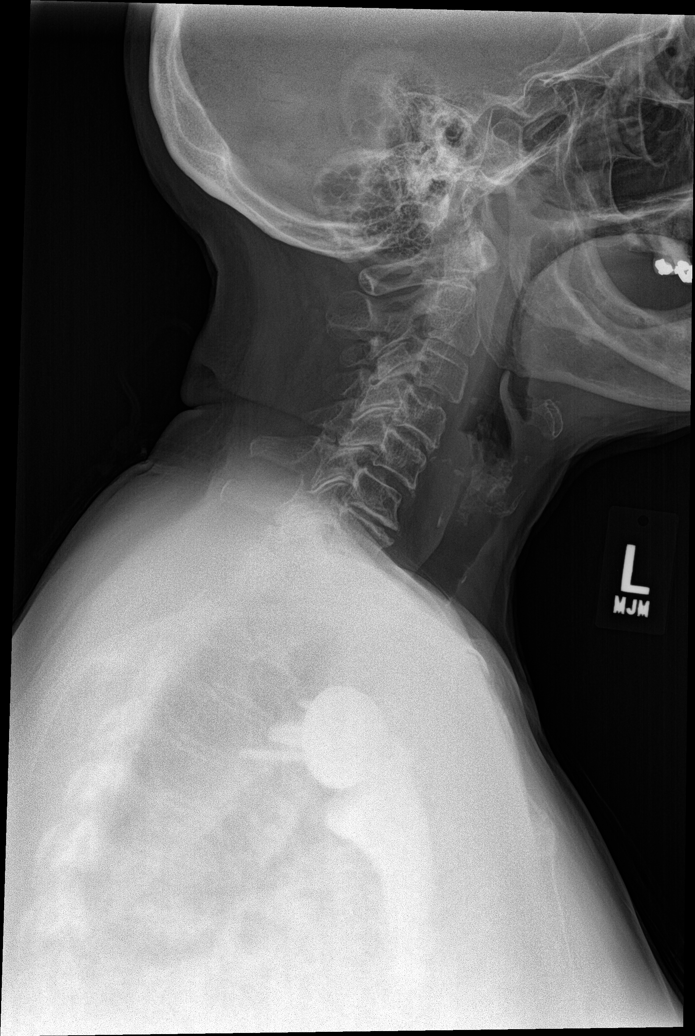

[c-spine obl (1 of 2)]
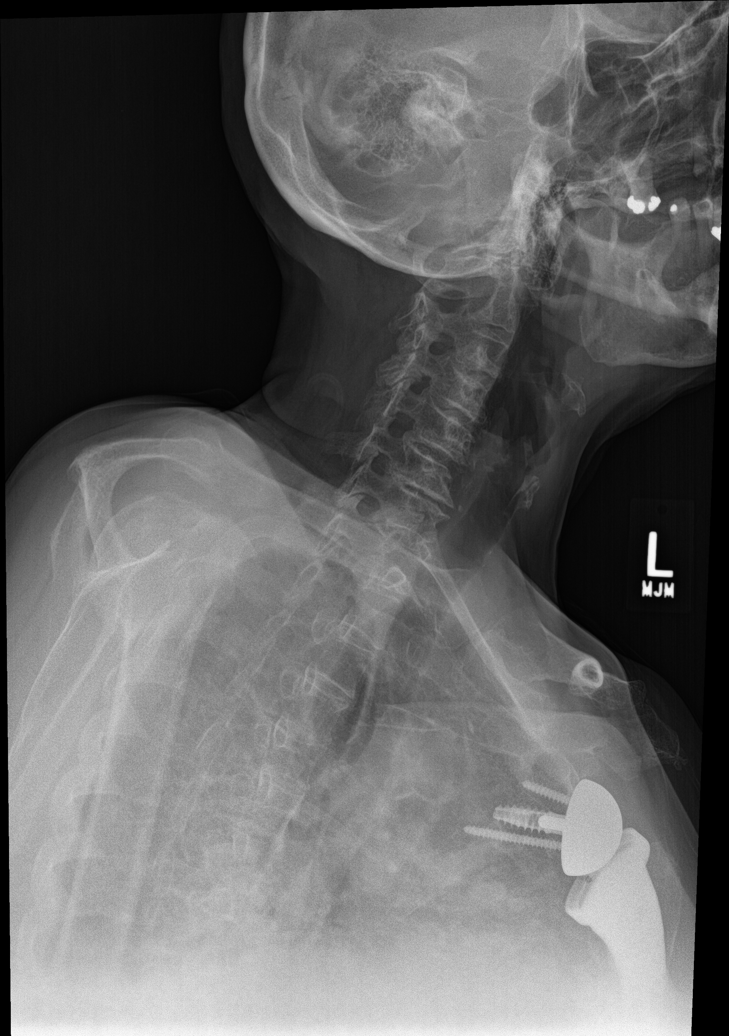

[c-spine obl (2 of 2)]
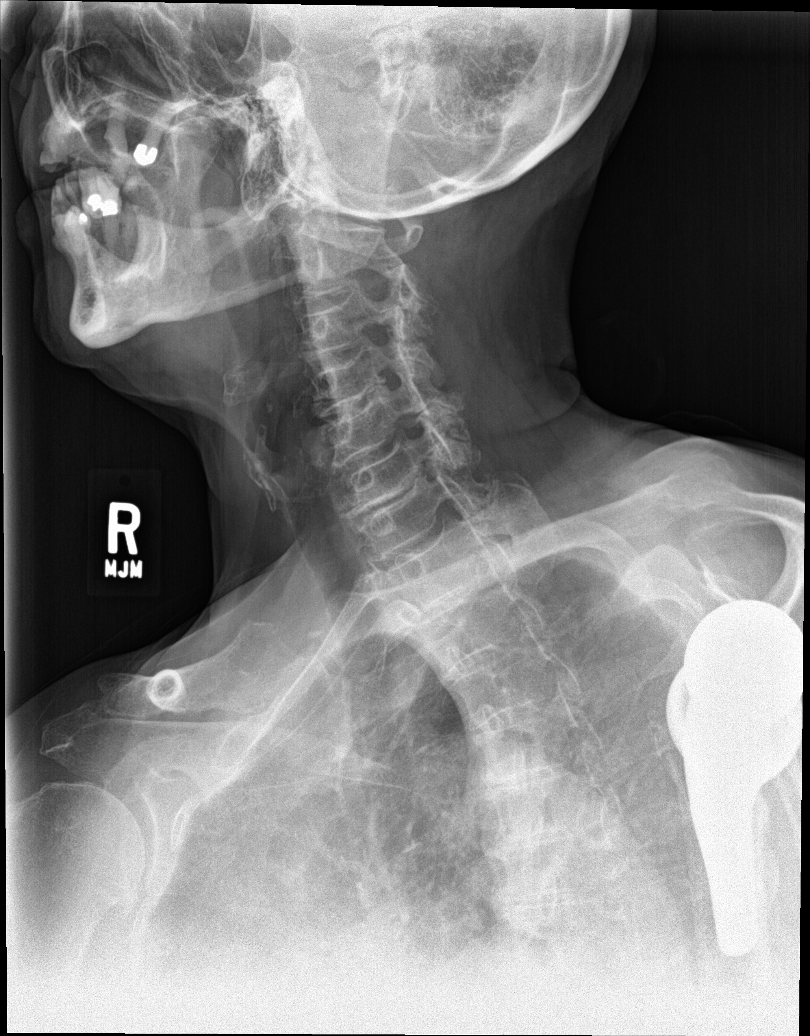

[c-spine ap]
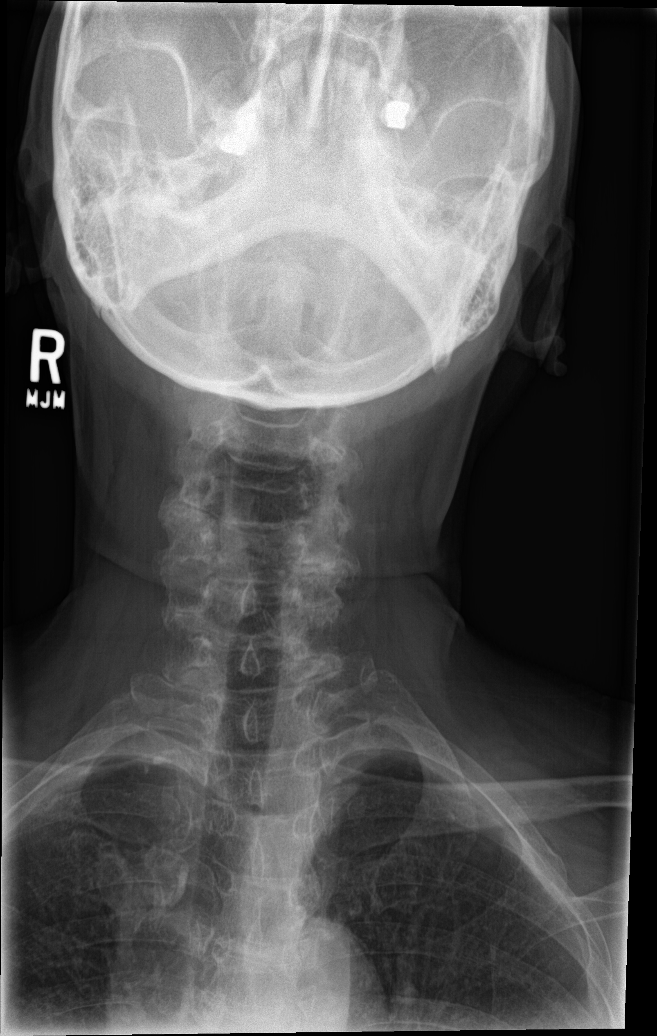

[c-spine open mouth]
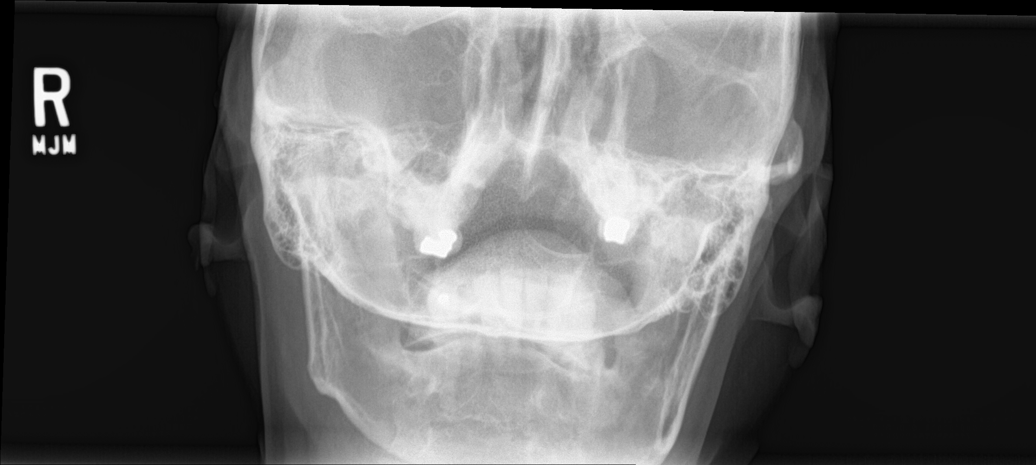

[[person_name]]
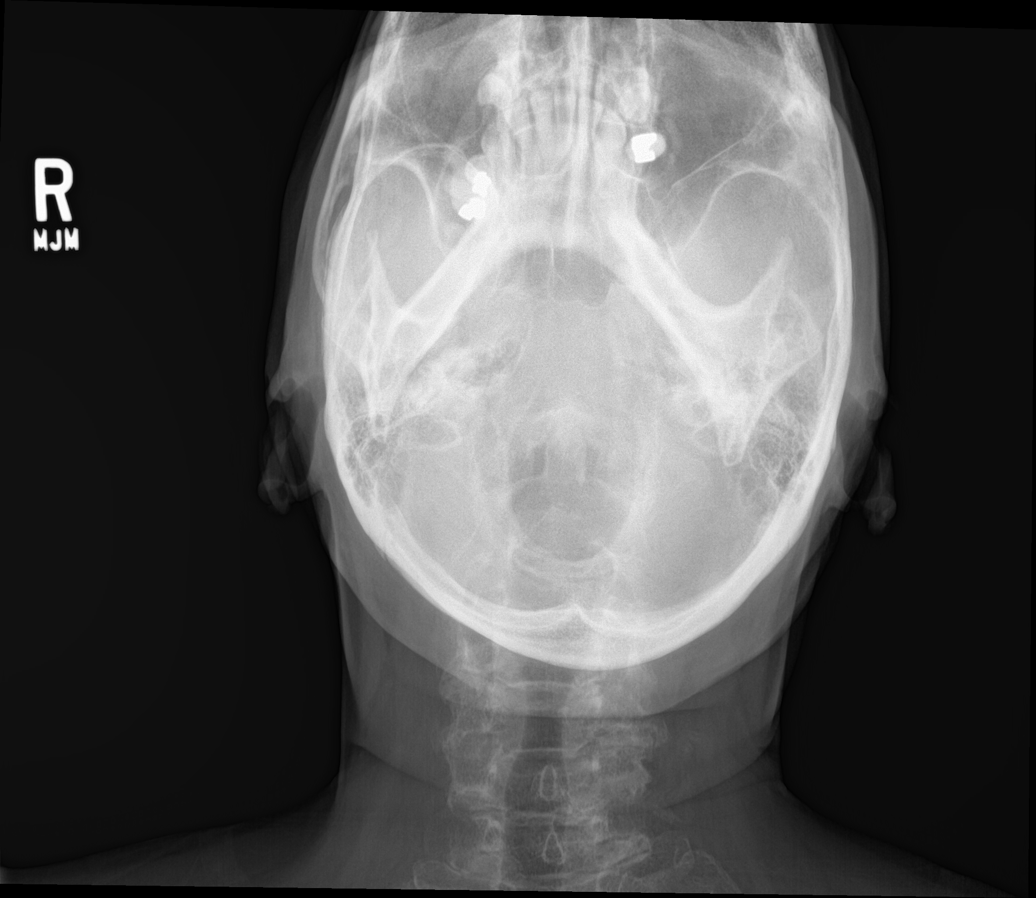

[6 of 6 positions shown; findings below may reference images not displayed]

FINDINGS: Prevertebral soft tissue contours remain normal. Chronic
straightening of cervical lordosis. Chronic disc space loss and
endplate spurring at C6-C7, and to a lesser extent C4-C5. Moderate
to severe chronic facet hypertrophy, left greater than right from
C4-C6. Bilateral posterior element alignment is within normal
limits. Mildly increased dextro convex cervical scoliosis. AP
alignment otherwise within normal limits. C1-C2 alignment appears
normal. Cervicothoracic junction alignment is within normal limits.
Negative lung apices. Left shoulder arthroplasty and right
glenohumeral joint degeneration also noted.
IMPRESSION: 1.  No acute osseous abnormality identified in the cervical spine.
2. Advanced chronic facet degeneration from C4 to C6, and disc and
endplate degeneration at C6-C7.

## 2018-05-17 ENCOUNTER — Encounter (HOSPITAL_COMMUNITY): Payer: Self-pay | Admitting: Emergency Medicine

## 2018-05-17 ENCOUNTER — Ambulatory Visit (HOSPITAL_COMMUNITY)
Admission: EM | Admit: 2018-05-17 | Discharge: 2018-05-17 | Disposition: A | Discharge status: Home / Self Care | Payer: Medicare Other | Attending: Emergency Medicine | Admitting: Emergency Medicine

## 2018-05-17 ENCOUNTER — Ambulatory Visit (INDEPENDENT_AMBULATORY_CARE_PROVIDER_SITE_OTHER): Payer: Medicare Other

## 2018-05-17 DIAGNOSIS — W19XXXA Unspecified fall, initial encounter: Secondary | ICD-10-CM

## 2018-05-17 DIAGNOSIS — S92354A Nondisplaced fracture of fifth metatarsal bone, right foot, initial encounter for closed fracture: Secondary | ICD-10-CM | POA: Diagnosis not present

## 2018-05-17 MED ORDER — HYDROCODONE-ACETAMINOPHEN 5-325 MG PO TABS: 0.5000 | ORAL_TABLET | Freq: Four times a day (QID) | ORAL | 0 refills | Status: DC | PRN

## 2018-05-17 NOTE — ED Triage Notes (Signed)
Pt sts trip and fall yesterday; pt sts pain in right foot

## 2018-05-17 NOTE — ED Provider Notes (Signed)
[facility]    CSN: [id number] Arrival date & time: 05/17/18  5830     History   Chief Complaint Chief Complaint  Patient presents with  . Fall  . Foot Pain    HPI Jenna Murphy is a 85 y.o. female.   85 year old female with history of arthritis, diabetes, HTN, polio as a child comes in for right foot pain after fall.  Patient states she tripped and fell last night, landing on the right shoulder.  Denies head injury, loss of consciousness.  Denies pain in the right shoulder.  States unsure how she hit her right foot, but has had pain, and swelling to the foot.  States she had polio as a child, so no range of motion of the ankle, toes at baseline.  She has been doing Ace wrap without relief.  States pain with weightbearing.     Past Medical History:  Diagnosis Date  . Arthritis   . Carpal tunnel syndrome of left wrist   . Diabetes mellitus    not on insulin  . Gallstone pancreatitis 02/2008  . Heart murmur   . Hypertension   . Polio    as child     Patient Active Problem List   Diagnosis Date Noted  . 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 Chest pain 05/22/2011  . Hypertension 05/22/2011    Past Surgical History:  Procedure Laterality Date  . CHOLECYSTECTOMY    . REVERSE SHOULDER ARTHROPLASTY Left 05/23/2014   Procedure: REVERSE SHOULDER ARTHROPLASTY;  Surgeon: Mable Paris, MD;  Location: [facility];  Service: Orthopedics;  Laterality: Left;  . REVERSE SHOULDER ARTHROPLASTY Right 12/11/2015   Procedure: REVERSE SHOULDER ARTHROPLASTY;  Surgeon: Jones Broom, MD;  Location: [facility] OR;  Service: Orthopedics;  Laterality: Right;  Right reverse total shoulder arthroplasty  . STERIOD INJECTION Left 05/23/2014   Procedure: STEROID INJECTION THUMB;  Surgeon: Mable Paris, MD;  Location: [facility] OR;  Service: Orthopedics;  Laterality: Left;  Steroid injection left thumb    OB History   No obstetric history on file.      Home  Medications    Prior to Admission medications   Medication Sig Start Date End Date Taking? Authorizing Provider  atorvastatin (LIPITOR) 20 MG tablet Take 20 mg by mouth daily.    [provider]  CARTIA XT 180 MG 24 hr capsule Take 180 mg by mouth daily. 09/20/16   [provider]  HYDROcodone-acetaminophen (NORCO) 5-325 MG tablet Take 0.5 tablets by mouth every 6 (six) hours as needed for severe pain. 05/17/18   Cathie Hoops, Braylan Faul V, PA-C  ibuprofen (ADVIL,MOTRIN) 400 MG tablet Take 1 tablet (400 mg total) by mouth every 8 (eight) hours as needed for mild pain. 09/23/16   Derwood Kaplan, MD  Insulin Glargine (TOUJEO SOLOSTAR [place]) Inject 15-20 Units into the skin See admin instructions. Inject 15 units subque in the morning and inject 20 units subque in the evening    [provider]  metFORMIN (GLUCOPHAGE) 500 MG tablet Take 500 mg by mouth 2 (two) times daily with a meal.     [provider]  Multiple Vitamins-Calcium (ONE-A-DAY WOMENS FORMULA) TABS Take 1 tablet by mouth.    [provider]  naproxen (NAPROSYN) 375 MG tablet Take 1 tablet twice daily as needed for pain. 12/02/17   Molpus, John, MD  valsartan-hydrochlorothiazide (DIOVAN-HCT) 320-25 MG tablet Take 1 tablet by mouth daily. 09/21/16   [provider]    Family  History Family History  Problem Relation Age of Onset  . Heart attack Mother        Died of MI, dec at 383   . Cirrhosis Father        alcohol induced     Social History Social History   Tobacco Use  . Smoking status: Former Smoker    Packs/day: 1.00    Years: 20.00    Pack years: 20.00    Types: Cigarettes    Last attempt to quit: 04/12/1968    Years since quitting: 50.1  . Smokeless tobacco: Never Used  Substance Use Topics  . Alcohol use: No    Alcohol/week: 0.0 standard drinks  . Drug use: No     Allergies   No known allergies   Review of Systems Review of Systems  Reason unable to perform ROS: See HPI as  above.     Physical Exam Triage Vital Signs ED Triage Vitals [05/17/18 1101]  Enc Vitals Group     BP (!) 153/63     Pulse Rate 74     Resp 18     Temp 98 F (36.7 C)     Temp Source Oral     SpO2 98 %     Weight      Height      Head Circumference      Peak Flow      Pain Score 5     Pain Loc      Pain Edu?      Excl. in GC?    No data found.  Updated Vital Signs BP (!) 153/63 (BP Location: Left Arm)   Pulse 74   Temp 98 F (36.7 C) (Oral)   Resp 18   SpO2 98%   Physical Exam Constitutional:      General: She is not in acute distress.    Appearance: She is well-developed. She is not diaphoretic.  HENT:     Head: Normocephalic and atraumatic.  Eyes:     Conjunctiva/sclera: Conjunctivae normal.     Pupils: Pupils are equal, round, and reactive to light.  Musculoskeletal:     Comments: Swelling to the right foot without contusion, erythema, warmth.  Deformity of the toes noted that is baseline for patient.  Diffuse tenderness to palpation of the ankle and foot.  No range of motion of ankle and toe status at baseline for patient.  Sensation intact.  Pedal pulse 2+.  Skin:    General: Skin is warm and dry.  Neurological:     Mental Status: She is alert and oriented to person, place, and time.      UC Treatments / Results  Labs (all labs ordered are listed, but only abnormal results are displayed) Labs Reviewed - No data to display  EKG None  Radiology Dg Foot Complete Right  Result Date: 05/17/2018 CLINICAL DATA:  Fall yesterday with first toe pain, initial encounter EXAM: RIGHT FOOT COMPLETE - 3+ VIEW COMPARISON:  None. FINDINGS: Tarsal degenerative changes are noted. Lucency is noted in the base of the fifth metatarsal with minimal cortical irregularity suspicious for an undisplaced fracture. No other fracture or dislocation is noted. Flattening of the plantar arch is noted of a chronic nature. Diffuse vascular calcifications are seen. IMPRESSION: Changes  suspicious for a fracture at the base of the fifth metatarsal. Chronic appearing changes as described. Electronically Signed   By: Alcide CleverMark  Lukens M.D.   On: 05/17/2018 11:41    Procedures Procedures (including  critical care time)  Medications Ordered in UC Medications - No data to display  Initial Impression / Assessment and Plan / UC Course  I have reviewed the triage vital signs and the nursing notes.  Pertinent labs & imaging results that were available during my care of the patient were reviewed by me and considered in my medical decision making (see chart for details).    X-ray results discussed with patient.  Cam walker applied.  Given patient at baseline needing walker, will defer crutches.  Discussed with patient, to try to limit weightbearing.  Symptomatic treatment discussed.  Otherwise follow-up with orthopedic for further evaluation.  Return precautions given.  Patient expresses understanding and agrees to plan.  Final Clinical Impressions(s) / UC Diagnoses   Final diagnoses:  Closed nondisplaced fracture of fifth metatarsal bone of right foot, initial encounter    ED Prescriptions    Medication Sig Dispense Auth. Provider   HYDROcodone-acetaminophen (NORCO) 5-325 MG tablet Take 0.5 tablets by mouth every 6 (six) hours as needed for severe pain. 10 tablet Belinda Fisher, PA-C     Controlled Substance Prescriptions [place] Controlled Substance Registry consulted? Yes, I have consulted the [place] Controlled Substances Registry for this patient, and feel the risk/benefit ratio today is favorable for proceeding with this prescription for a controlled substance.   Belinda Fisher, PA-C 05/17/18 1329

## 2018-05-17 NOTE — Discharge Instructions (Addendum)
As discussed, xray showed possible fracture to the right foot. Boot applied today, please limit walking/bearing weight. Take tylenol and motrin for pain. If needed, I have called in some Norco for severe pain. This can make you drowsy, so please monitor use. This also contains tylenol, so please monitor dosage of tylenol use in a day. Use ice compress to help with the swelling and pain. Follow up with orthopedics for further evaluation needed.

## 2018-07-08 ENCOUNTER — Other Ambulatory Visit: Payer: Self-pay | Admitting: Cardiology

## 2018-07-24 ENCOUNTER — Ambulatory Visit: Payer: Medicare Other | Admitting: Cardiology

## 2018-08-08 ENCOUNTER — Other Ambulatory Visit: Payer: Self-pay | Admitting: Cardiology

## 2018-08-26 ENCOUNTER — Other Ambulatory Visit: Payer: Self-pay | Admitting: Cardiology

## 2018-09-07 ENCOUNTER — Other Ambulatory Visit: Payer: Self-pay | Admitting: Cardiology

## 2018-09-07 NOTE — Telephone Encounter (Signed)
Please fill

## 2018-09-25 ENCOUNTER — Other Ambulatory Visit: Payer: Self-pay

## 2018-09-25 ENCOUNTER — Encounter: Payer: Self-pay | Admitting: Cardiology

## 2018-09-25 ENCOUNTER — Ambulatory Visit: Payer: Medicare Other | Admitting: Cardiology

## 2018-09-25 DIAGNOSIS — I739 Peripheral vascular disease, unspecified: Secondary | ICD-10-CM

## 2018-09-25 DIAGNOSIS — I10 Essential (primary) hypertension: Secondary | ICD-10-CM | POA: Diagnosis not present

## 2018-09-25 NOTE — Progress Notes (Signed)
Telephone visit note  Subjective:   Jenna Murphy, female    DOB: 1928/03/23, 85 y.o.   MRN: [id number]   I connected with the patient on 09/25/18 by a telephone call and verified that I am speaking with the correct person using two identifiers.     I offered the patient a video enabled application for a virtual visit. Unfortunately, this could not be accomplished due to technical difficulties/lack of video enabled phone/computer. I discussed the limitations of evaluation and management by telemedicine and the availability of in person appointments. The patient expressed understanding and agreed to proceed.   This visit type was conducted due to national recommendations for restrictions regarding the COVID-19 Pandemic (e.g. social distancing).  This format is felt to be most appropriate for this patient at this time.  All issues noted in this document were discussed and addressed.  No physical exam was performed (except for noted visual exam findings with Tele health visits).  The patient has consented to conduct a Tele health visit and understands insurance will be billed.   Chief complaint:  PAD  HPI   85 y/o [place] female with hypertension, type 2 DM.  She has been doing well since her last visit. Se has no complaints today. She denies chest pain, palpitations, leg edema, orthopnea, PND, TIA/syncope. On review, she endorses stable, unchanged shortness of breath. She denies claudication or new leg wounds. She has not checked her blood pressure in some time. She is planning on seeing her PCP soon.   Past Medical History:  Diagnosis Date  . Arthritis   . Carpal tunnel syndrome of left wrist   . Diabetes mellitus    not on insulin  . Gallstone pancreatitis 02/2008  . Heart murmur   . Hypertension   . Polio    as child      Past Surgical History:  Procedure Laterality Date  . CHOLECYSTECTOMY    . REVERSE SHOULDER ARTHROPLASTY Left 05/23/2014   Procedure: REVERSE  SHOULDER ARTHROPLASTY;  Surgeon: Mable ParisJustin William Chandler, MD;  [facility];  Service: Orthopedics;  Laterality: Left;  . REVERSE SHOULDER ARTHROPLASTY Right 12/11/2015   Procedure: REVERSE SHOULDER ARTHROPLASTY;  Surgeon: Jones BroomJustin Chandler, MD;  Location: [facility] OR;  Service: Orthopedics;  Laterality: Right;  Right reverse total shoulder arthroplasty  . STERIOD INJECTION Left 05/23/2014   Procedure: STEROID INJECTION THUMB;  Surgeon: Mable ParisJustin William Chandler, MD;  [facility];  Service: Orthopedics;  Laterality: Left;  Steroid injection left thumb     Social History   Socioeconomic History  . Marital status: Widowed    Spouse name: Not on file  . Number of children: Not on file  . Years of education: Not on file  . Highest education level: Not on file  Occupational History  . Not on file  Social Needs  . Financial resource strain: Not on file  . Food insecurity    Worry: Not on file    Inability: Not on file  . Transportation needs    Medical: Not on file    Non-medical: Not on file  Tobacco Use  . Smoking status: Former Smoker    Packs/day: 1.00    Years: 20.00    Pack years: 20.00    Types: Cigarettes    Quit date: 04/12/1968    Years since quitting: 50.4  . Smokeless tobacco: Never Used  Substance and Sexual Activity  . Alcohol use: No    Alcohol/week: 0.0 standard drinks  . Drug  use: No  . Sexual activity: Never  Lifestyle  . Physical activity    Days per week: Not on file    Minutes per session: Not on file  . Stress: Not on file  Relationships  . Social Herbalist on phone: Not on file    Gets together: Not on file    Attends religious service: Not on file    Active member of club or organization: Not on file    Attends meetings of clubs or organizations: Not on file    Relationship status: Not on file  . Intimate partner violence    Fear of current or ex partner: Not on file    Emotionally abused: Not on file    Physically abused: Not on file     Forced sexual activity: Not on file  Other Topics Concern  . Not on file  Social History Narrative   Lives at home by self, no cane or walker, goes to the gym twice a week and does yoga, works out. Has three sons and two daughters. [place] Greek son [phone].     Family History  Problem Relation Age of Onset  . Heart attack Mother        Died of MI, dec at 68   . Cirrhosis Father        alcohol induced      Current Outpatient Medications on File Prior to Visit  Medication Sig Dispense Refill  . amLODipine (NORVASC) 5 MG tablet TAKE 1 TABLET BY MOUTH EVERY DAY 30 tablet 2  . atorvastatin (LIPITOR) 20 MG tablet Take 20 mg by mouth daily.    . Insulin Glargine (TOUJEO SOLOSTAR [place]) Inject 20-25 Units into the skin See admin instructions. Inject 20 units subque in the morning and inject 2 units subque in the evening    . metFORMIN (GLUCOPHAGE) 500 MG tablet Take 500 mg by mouth 2 (two) times daily with a meal.     . metoprolol tartrate (LOPRESSOR) 50 MG tablet TAKE 1 AND 1/2 TABLETS BY MOUTH TWICE DAILY 180 tablet 2  . CARTIA XT 180 MG 24 hr capsule Take 180 mg by mouth daily.  5  . HYDROcodone-acetaminophen (NORCO) 5-325 MG tablet Take 0.5 tablets by mouth every 6 (six) hours as needed for severe pain. (Patient not taking: Reported on 09/25/2018) 10 tablet 0  .   20 tablet 0  . Multiple Vitamins-Calcium (ONE-A-DAY WOMENS FORMULA) TABS Take 1 tablet by mouth.     No current facility-administered medications on file prior to visit.     Cardiovascular studies:  EKG 12/14/2017: Sinus rhythm 76 bpm. RBBB. LAFB. Occasional PVC.  Echocardiogram 01/19/2018: 1. Left ventricle cavity is normal in size. Mild concentric hypertrophy of the left ventricle. Normal global wall motion. Doppler evidence of grade I (impaired) diastolic dysfunction, elevated LAP. Calculated EF 55%. 2. Left atrial cavity is mildly dilated. 3. Right ventricle cavity is slightly dilated. Normal right ventricular  function. 4. Trileaflet aortic valve with no regurgitation noted. Mild aortic valve leaflet calcification. No evidence of aortic valve stenosis. 5. Mild (Grade I) mitral regurgitation. Mild calcification of the mitral valve annulus. 6. Mild tricuspid regurgitation. No evidence of pulmonary hypertension.  ABI 01/19/2018: This exam reveals mildly decreased perfusion of the right lower extremity, noted at the dorsalis pedis artery level (ABI 0.82) and moderately decreased perfusion of the left lower extremity, noted at the dorsalis pedis artery level (ABI 0.71).  Lexiscan myoview stress test 12/19/2017: 1.  The resting electrocardiogram demonstrated normal sinus rhythm, LAD, LAFB, RBBB.  Stress EKG is non-diagnostic for ischemia as it a pharmacologic stress using Lexiscan. Stress symptoms included abdominal discomfort and headache. 2. Myocardial perfusion imaging is normal without ischemia or scar. Overall left ventricular systolic function was visually normal without regional wall motion abnormalities, however calculated at 34%.  3. This is an intermediate risk study, clinical correlation recommended.  Recent labs: None  Review of Systems  Constitution: Negative for decreased appetite, malaise/fatigue, weight gain and weight loss.  HENT: Negative for congestion.   Eyes: Negative for visual disturbance.  Cardiovascular: Positive for dyspnea on exertion (Mild, stable). Negative for chest pain, leg swelling, palpitations and syncope.  Respiratory: Positive for shortness of breath (Mild, stable). Negative for cough.   Endocrine: Negative for cold intolerance.  Hematologic/Lymphatic: Does not bruise/bleed easily.  Skin: Negative for itching and rash.  Musculoskeletal: Negative for myalgias.  Gastrointestinal: Negative for abdominal pain, nausea and vomiting.  Genitourinary: Negative for dysuria.  Neurological: Negative for dizziness and weakness.  Psychiatric/Behavioral: The patient is not  nervous/anxious.   All other systems reviewed and are negative.       No vitals available. (Measured by the patient using a home BP monitor)  Physical Exam: Not performed, as this is a telephone visit      Assessment & Recommendations:  85 y/o [place] female with hypertension, type 2 DM.   Shortness of breath: Multifactorial, minimal. Reassuring stress test and echocardiogram. Continue medical treatment for hypertension.  Hypertension: No BP check available today. Recommend BP f/u w/PCP. No change made to her medications today.  Mildly abnormal ABI: Mild PAD. Continue medical management. Recommend ASA 81 mg daily, and atorvastatin 20 mg daily.  I will see her for in office visit in 3 months.   Elder NegusManish J Cristine Daw, MD [facility] Pager: [phone] Office: [phone] If no answer Cell [phone]

## 2018-12-19 ENCOUNTER — Other Ambulatory Visit: Payer: Self-pay | Admitting: Cardiology

## 2019-04-11 ENCOUNTER — Emergency Department (HOSPITAL_COMMUNITY): Payer: Medicare Other

## 2019-04-11 ENCOUNTER — Other Ambulatory Visit: Payer: Self-pay

## 2019-04-11 ENCOUNTER — Emergency Department (HOSPITAL_COMMUNITY)
Admission: EM | Admit: 2019-04-11 | Discharge: 2019-04-11 | Disposition: A | Discharge status: Home / Self Care | Payer: Medicare Other | Attending: Emergency Medicine | Admitting: Emergency Medicine

## 2019-04-11 ENCOUNTER — Encounter (HOSPITAL_COMMUNITY): Payer: Self-pay | Admitting: Emergency Medicine

## 2019-04-11 DIAGNOSIS — U071 COVID-19: Secondary | ICD-10-CM | POA: Insufficient documentation

## 2019-04-11 DIAGNOSIS — R05 Cough: Secondary | ICD-10-CM | POA: Diagnosis present

## 2019-04-11 DIAGNOSIS — J189 Pneumonia, unspecified organism: Secondary | ICD-10-CM | POA: Diagnosis not present

## 2019-04-11 LAB — BASIC METABOLIC PANEL
Anion gap: 11 (ref 5–15)
BUN: 12 mg/dL (ref 8–23)
CO2: 20 mmol/L — ABNORMAL LOW (ref 22–32)
Calcium: 9.2 mg/dL (ref 8.9–10.3)
Chloride: 108 mmol/L (ref 98–111)
Creatinine, Ser: 1.1 mg/dL — ABNORMAL HIGH (ref 0.44–1.00)
GFR calc Af Amer: 52 mL/min — ABNORMAL LOW (ref >60)
GFR calc non Af Amer: 44 mL/min — ABNORMAL LOW (ref >60)
Glucose, Bld: 203 mg/dL — ABNORMAL HIGH (ref 70–99)
Potassium: 4.1 mmol/L (ref 3.5–5.1)
Sodium: 139 mmol/L (ref 135–145)

## 2019-04-11 LAB — CBC
HCT: 39 % (ref 36.0–46.0)
Hemoglobin: 11.8 g/dL — ABNORMAL LOW (ref 12.0–15.0)
MCH: 25.1 pg — ABNORMAL LOW (ref 26.0–34.0)
MCHC: 30.3 g/dL (ref 30.0–36.0)
MCV: 83 fL (ref 80.0–100.0)
Platelets: 277 10*3/uL (ref 150–400)
RBC: 4.7 MIL/uL (ref 3.87–5.11)
RDW: 14.7 % (ref 11.5–15.5)
WBC: 6.3 10*3/uL (ref 4.0–10.5)
nRBC: 0 % (ref 0.0–0.2)

## 2019-04-11 MED ORDER — DOXYCYCLINE HYCLATE 100 MG PO TABS
100.0000 mg | ORAL_TABLET | Freq: Once | ORAL | Status: AC
  Administered 2019-04-11: 13:00:00 100 mg via ORAL
  Filled 2019-04-11: qty 1

## 2019-04-11 MED ORDER — DOXYCYCLINE HYCLATE 100 MG PO CAPS: 100.0000 mg | ORAL_CAPSULE | Freq: Two times a day (BID) | ORAL | 0 refills | Status: DC

## 2019-04-11 NOTE — ED Notes (Signed)
Patient verbalizes understanding of discharge instructions. Opportunity for questioning and answers were provided. Pt discharged from ED. 

## 2019-04-11 NOTE — ED Provider Notes (Signed)
[facility] Provider Note   CSN: [id number] Arrival date & time: 04/11/19  [id number]     History Chief Complaint  Patient presents with  . Cough    Jenna Murphy is a 85 y.o. female.  85 yo F with a chief complaint of a cough.  Has been going on for the past couple weeks.  Has had some worsening shortness of breath on exertion with this.  Denies any chest pain or pressure.  No fevers or chills.  Has not been around anyone, has been isolating herself due to the coronavirus pandemic.  Has only seen her son in person and typically stays away from him and wiped on the doorknobs afterwards.  Denies nausea vomiting or diarrhea denies abdominal pain.   The history is provided by the patient.  Cough Associated symptoms: shortness of breath   Associated symptoms: no chest pain, no chills, no fever, no headaches, no myalgias, no rhinorrhea and no wheezing   Illness Severity:  Moderate Onset quality:  Gradual Duration:  2 days Timing:  Constant Progression:  Worsening Chronicity:  New Associated symptoms: cough and shortness of breath   Associated symptoms: no chest pain, no congestion, no fever, no headaches, no myalgias, no nausea, no rhinorrhea, no vomiting and no wheezing        Past Medical History:  Diagnosis Date  . Arthritis   . Carpal tunnel syndrome of left wrist   . Diabetes mellitus    not on insulin  . Gallstone pancreatitis 02/2008  . Heart murmur   . Hypertension   . Polio    as child     Patient Active Problem List   Diagnosis Date Noted  . PAD (peripheral artery disease) (HCC) 09/25/2018  . 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 Chest pain 05/22/2011  . Essential hypertension 05/22/2011    Past Surgical History:  Procedure Laterality Date  . CHOLECYSTECTOMY    . REVERSE SHOULDER ARTHROPLASTY Left 05/23/2014   Procedure: REVERSE SHOULDER ARTHROPLASTY;  Surgeon: Mable ParisJustin William Chandler, MD;  [facility];  Service: Orthopedics;  Laterality: Left;  . REVERSE SHOULDER ARTHROPLASTY Right 12/11/2015   Procedure: REVERSE SHOULDER ARTHROPLASTY;  Surgeon: Jones BroomJustin Chandler, MD;  Location: [facility] OR;  Service: Orthopedics;  Laterality: Right;  Right reverse total shoulder arthroplasty  . STERIOD INJECTION Left 05/23/2014   Procedure: STEROID INJECTION THUMB;  Surgeon: Mable ParisJustin William Chandler, MD;  [facility];  Service: Orthopedics;  Laterality: Left;  Steroid injection left thumb     OB History   No obstetric history on file.     Family History  Problem Relation Age of Onset  . Heart attack Mother        Died of MI, dec at 2783   . Cirrhosis Father        alcohol induced     Social History   Tobacco Use  . Smoking status: Former Smoker    Packs/day: 1.00    Years: 20.00    Pack years: 20.00    Types: Cigarettes    Quit date: 04/12/1968    Years since quitting: 51.0  . Smokeless tobacco: Never Used  Substance Use Topics  . Alcohol use: No    Alcohol/week: 0.0 standard drinks  . Drug use: No    Home Medications Prior to Admission medications   Medication Sig Start Date End Date Taking? Authorizing Provider  amLODipine (NORVASC) 5 MG tablet TAKE 1 TABLET BY MOUTH EVERY DAY 12/19/18  Patwardhan, Manish J, MD  atorvastatin (LIPITOR) 20 MG tablet Take 20 mg by mouth daily.    [provider]  CARTIA XT 180 MG 24 hr capsule Take 180 mg by mouth daily. 09/20/16   [provider]  doxycycline (VIBRAMYCIN) 100 MG capsule Take 1 capsule (100 mg total) by mouth 2 (two) times daily. One po bid x 7 days 04/11/19   Melene Plan, DO  HYDROcodone-acetaminophen [facility]) 5-325 MG tablet Take 0.5 tablets by mouth every 6 (six) hours as needed for severe pain. Patient not taking: Reported on 09/25/2018 05/17/18   Belinda Fisher, PA-C  ibuprofen (ADVIL,MOTRIN) 400 MG tablet Take 1 tablet (400 mg total) by mouth every 8 (eight) hours as needed for mild pain. Patient not taking: Reported on  09/25/2018 09/23/16   Derwood Kaplan, MD  Insulin Glargine (TOUJEO SOLOSTAR [place]) Inject 20-25 Units into the skin See admin instructions. Inject 20 units subque in the morning and inject 2 units subque in the evening    [provider]  metFORMIN (GLUCOPHAGE) 500 MG tablet Take 500 mg by mouth 2 (two) times daily with a meal.     [provider]  metoprolol tartrate (LOPRESSOR) 50 MG tablet TAKE 1 AND 1/2 TABLETS BY MOUTH TWICE DAILY Patient taking differently: 50 mg 3 (three) times daily.  08/08/18   Patwardhan, Anabel Bene, MD  Multiple Vitamins-Calcium (ONE-A-DAY WOMENS FORMULA) TABS Take 1 tablet by mouth.    [provider]  naproxen (NAPROSYN) 375 MG tablet Take 1 tablet twice daily as needed for pain. Patient not taking: Reported on 09/25/2018 12/02/17   Molpus, John, MD  spironolactone (ALDACTONE) 25 MG tablet TAKE 1 TABLET BY MOUTH EVERY MORNING Patient not taking: Reported on 09/25/2018 07/10/18   Patwardhan, Anabel Bene, MD  valsartan-hydrochlorothiazide (DIOVAN-HCT) 320-25 MG tablet Take 1 tablet by mouth daily. 09/21/16   [provider]    Allergies    No known allergies  Review of Systems   Review of Systems  Constitutional: Negative for chills and fever.  HENT: Negative for congestion and rhinorrhea.   Eyes: Negative for redness and visual disturbance.  Respiratory: Positive for cough and shortness of breath. Negative for wheezing.   Cardiovascular: Negative for chest pain and palpitations.  Gastrointestinal: Negative for nausea and vomiting.  Genitourinary: Negative for dysuria and urgency.  Musculoskeletal: Negative for arthralgias and myalgias.  Skin: Negative for pallor and wound.  Neurological: Negative for dizziness and headaches.    Physical Exam Updated Vital Signs BP (!) 185/82 (BP Location: Right Arm)   Pulse 93   Temp 97.7 F (36.5 C) (Oral)   Resp 18   SpO2 93%   Physical Exam Vitals and nursing note reviewed.  Constitutional:       General: She is not in acute distress.    Appearance: She is well-developed. She is not diaphoretic.  HENT:     Head: Normocephalic and atraumatic.  Eyes:     Pupils: Pupils are equal, round, and reactive to light.  Cardiovascular:     Rate and Rhythm: Normal rate and regular rhythm.     Heart sounds: No murmur. No friction rub. No gallop.   Pulmonary:     Effort: Pulmonary effort is normal.     Breath sounds: No wheezing or rales.  Abdominal:     General: There is no distension.     Palpations: Abdomen is soft.     Tenderness: There is no abdominal tenderness.  Musculoskeletal:  General: No tenderness.     Cervical back: Normal range of motion and neck supple.  Skin:    General: Skin is warm and dry.  Neurological:     Mental Status: She is alert and oriented to person, place, and time.  Psychiatric:        Behavior: Behavior normal.     ED Results / Procedures / Treatments   Labs (all labs ordered are listed, but only abnormal results are displayed) Labs Reviewed  CBC - Abnormal; Notable for the following components:      Result Value   Hemoglobin 11.8 (*)    MCH 25.1 (*)    All other components within normal limits  BASIC METABOLIC PANEL - Abnormal; Notable for the following components:   CO2 20 (*)    Glucose, Bld 203 (*)    Creatinine, Ser 1.10 (*)    GFR calc non Af Amer 44 (*)    GFR calc Af Amer 52 (*)    All other components within normal limits  NOVEL CORONAVIRUS, NAA (HOSP ORDER, SEND-OUT TO REF LAB; TAT 18-24 HRS)    EKG EKG Interpretation  Date/Time:  Wednesday April 11 2019 10:02:32 EST Ventricular Rate:  98 PR Interval:  170 QRS Duration: 124 QT Interval:  378 QTC Calculation: 482 R Axis:   -69 Text Interpretation: Sinus rhythm with Premature atrial complexes Right bundle branch block Left anterior fascicular block  Bifascicular block  Left ventricular hypertrophy with repolarization abnormality ( R in aVL , Romhilt-Estes ) Cannot  rule out Septal infarct , age undetermined Abnormal ECG No significant change since last tracing Confirmed by Melene Plan [phone]) on 04/11/2019 12:19:35 PM   Radiology DG Chest 2 View  Result Date: 04/11/2019 CLINICAL DATA:  85 year old female with shortness of breath. EXAM: CHEST - 2 VIEW COMPARISON:  Chest radiograph dated 12/02/2017. FINDINGS: Faint bilateral mid to lower lung field streaky densities, increased since the prior radiograph and concerning for pneumonia, possibly viral or atypical in etiology. Clinical correlation is recommended. Trace pleural effusions may be present. There is no pneumothorax. Stable mild cardiomegaly. Atherosclerotic calcification of the aorta. No acute osseous pathology. Bilateral shoulder arthroplasty and right upper quadrant cholecystectomy clips. IMPRESSION: Bilateral mid to lower lung field streaky densities concerning for pneumonia, possibly viral or atypical in etiology. Clinical correlation is recommended. Electronically Signed   By: Elgie Collard M.D.   On: 04/11/2019 10:35    Procedures Procedures (including critical care time)  Medications Ordered in ED Medications  doxycycline (VIBRA-TABS) tablet 100 mg (has no administration in time range)    ED Course  I have reviewed the triage vital signs and the nursing notes.  Pertinent labs & imaging results that were available during my care of the patient were reviewed by me and considered in my medical decision making (see chart for details).    MDM Rules/Calculators/A&P                      85 yo F with a chief complaints of cough and shortness of breath.  Going on for couple weeks.  Chest x-ray is consistent with left lower lobe pneumonia.  Will start on antibiotics.  As this is occurring during the novel coronavirus pandemic we will send off a Covid test.  Have her continue self isolate.  PCP follow-up.  12:55 PM:  I have discussed the diagnosis/risks/treatment options with the patient and  believe the pt to be eligible for discharge home to follow-up  with PCP. We also discussed returning to the ED immediately if new or worsening sx occur. We discussed the sx which are most concerning (e.g., sudden worsening pain, fever, inability to tolerate by mouth) that necessitate immediate return. Medications administered to the patient during their visit and any new prescriptions provided to the patient are listed below.  Medications given during this visit Medications  doxycycline (VIBRA-TABS) tablet 100 mg (has no administration in time range)     The patient appears reasonably screen and/or stabilized for discharge and I doubt any other medical condition or other [facility] requiring further screening, evaluation, or treatment in the ED at this time prior to discharge.   Final Clinical Impression(s) / ED Diagnoses Final diagnoses:  Community acquired pneumonia of left lower lobe of lung    Rx / DC Orders ED Discharge Orders         Ordered    doxycycline (VIBRAMYCIN) 100 MG capsule  2 times daily     04/11/19 [place], Elenor Wildes, DO 04/11/19 1255

## 2019-04-11 NOTE — Discharge Instructions (Signed)
Return for confusion, fever, worsening shortness of breath.

## 2019-04-11 NOTE — ED Notes (Signed)
Got patient on the bed patient stated that she didn't need to put a gown on or be on a monitor for just a cough patient stated that all her labs was done and she would just sit on the bed and wait for the doctor patient has call bell in reach

## 2019-04-11 NOTE — ED Triage Notes (Signed)
Pt endorses an intermittent cough for a few weeks. Denies CP. SOB started today.

## 2019-04-12 ENCOUNTER — Telehealth (HOSPITAL_COMMUNITY): Payer: Self-pay

## 2019-04-14 LAB — NOVEL CORONAVIRUS, NAA (HOSP ORDER, SEND-OUT TO REF LAB; TAT 18-24 HRS): SARS-CoV-2, NAA: DETECTED — AB

## 2019-04-16 ENCOUNTER — Telehealth (HOSPITAL_COMMUNITY): Payer: Self-pay

## 2019-05-27 DIAGNOSIS — R0609 Other forms of dyspnea: Secondary | ICD-10-CM | POA: Insufficient documentation

## 2019-05-27 NOTE — Progress Notes (Signed)
Subjective:   Jenna Murphy, female    DOB: May 09, 1927, 86 y.o.   MRN: [id number]   Chief complaint:  PAD  HPI   86 y/o [place] American female with hypertension, type 2 DM, exertional dyspnea.  Patient has no chest pain.  She has dyspnea with exertion, such as walking up steps.  He also notices symptoms of palpitations that occur every couple of days.  Blood pressure is elevated today.  It appears that she is not taking valsartan-hydrochlorothiazide, which was on her list.  Current Outpatient Medications on File Prior to Visit  Medication Sig Dispense Refill  . amLODipine (NORVASC) 5 MG tablet TAKE 1 TABLET BY MOUTH EVERY DAY 30 tablet 2  . atorvastatin (LIPITOR) 20 MG tablet Take 20 mg by mouth daily.    Marland Kitchen ibuprofen (ADVIL,MOTRIN) 400 MG tablet Take 1 tablet (400 mg total) by mouth every 8 (eight) hours as needed for mild pain. 20 tablet 0  . Insulin Glargine (TOUJEO SOLOSTAR [place]) Inject 20-25 Units into the skin See admin instructions. Inject 20 units subque in the morning and inject 2 units subque in the evening    . metFORMIN (GLUCOPHAGE) 500 MG tablet Take 500 mg by mouth 2 (two) times daily with a meal.     . metoprolol tartrate (LOPRESSOR) 50 MG tablet TAKE 1 AND 1/2 TABLETS BY MOUTH TWICE DAILY (Patient taking differently: 50 mg 3 (three) times daily. ) 180 tablet 2  . OXYGEN Inhale into the lungs.    Marland Kitchen spironolactone (ALDACTONE) 25 MG tablet TAKE 1 TABLET BY MOUTH EVERY MORNING 90 tablet 3  . valsartan-hydrochlorothiazide (DIOVAN-HCT) 320-25 MG tablet Take 1 tablet by mouth daily.  2   No current facility-administered medications on file prior to visit.     Cardiovascular studies:  EKG 04/11/2019: Sinus rhythm 98 bpm. RBBB. LAFB.   Echocardiogram 01/19/2018: 1. Left ventricle cavity is normal in size. Mild concentric hypertrophy of the left ventricle. Normal global wall motion. Doppler evidence of grade I (impaired) diastolic dysfunction, elevated LAP.  Calculated EF 55%. 2. Left atrial cavity is mildly dilated. 3. Right ventricle cavity is slightly dilated. Normal right ventricular function. 4. Trileaflet aortic valve with no regurgitation noted. Mild aortic valve leaflet calcification. No evidence of aortic valve stenosis. 5. Mild (Grade I) mitral regurgitation. Mild calcification of the mitral valve annulus. 6. Mild tricuspid regurgitation. No evidence of pulmonary hypertension.  ABI 01/19/2018: This exam reveals mildly decreased perfusion of the right lower extremity, noted at the dorsalis pedis artery level (Rt ABI 0.82) and moderately decreased perfusion of the left lower extremity, noted at the dorsalis pedis artery level (Lt ABI 0.71).  Lexiscan myoview stress test 12/19/2017: 1. The resting electrocardiogram demonstrated normal sinus rhythm, LAD, LAFB, RBBB.  Stress EKG is non-diagnostic for ischemia as it a pharmacologic stress using Lexiscan. Stress symptoms included abdominal discomfort and headache. 2. Myocardial perfusion imaging is normal without ischemia or scar. Overall left ventricular systolic function was visually normal without regional wall motion abnormalities, however calculated at 34%.  3. This is an intermediate risk study, clinical correlation recommended.  Recent labs: 04/11/2019: Glucose 203, BUN/Cr 12/1.10. EGFR 44. Na/K 139/4.1.  H/H 11.8/39. MCV 83. Platelets 277  12/2015: HbA1C 8.2%  02/2008: Chol 116, TG 43, HDL 51, LDL 56   Review of Systems  Cardiovascular: Positive for dyspnea on exertion. Negative for chest pain, leg swelling, palpitations and syncope.         Vitals:   05/28/19 9628  05/28/19 0954  BP: (!) 172/94 (!) 160/89  Pulse: 62 61  Temp: 97.9 F (36.6 C)   SpO2: 94%      Physical Exam: Physical Exam  Constitutional: She appears well-developed and well-nourished.  Neck: No JVD present.  Cardiovascular: Normal rate, regular rhythm, normal heart sounds and intact distal  pulses.  No murmur heard. Pulmonary/Chest: Effort normal. She has no wheezes. She has rales (Minimal bibasilar rales).  Musculoskeletal:        General: Edema (1+ b/l) present.  Nursing note and vitals reviewed.        Assessment & Recommendations:  86 y/o [place] American female with hypertension, type 2 DM, exertional dyspnea  Shortness of breath: Mild volume overload on exam.  Suspect HFpEF due to uncontrolled hypertension. Increased spironolactone to 50 mg daily. Will check echocardiogram. She has follow-up with PCP next week blood pressure can be rechecked.  She should also have BMP checked to evaluate her creatinine and potassium.  Palpitations: Recommend 2-week event monitor to evaluate for any malignant tachyarrhythmias.  Hypertension: Uncontrolled.  Increase spironolactone to 50 mg daily.    Mildly abnormal ABI: Mild PAD.  No claudication symptoms.   Continue medical management. Recommend ASA 81 mg daily, and atorvastatin 20 mg daily.  Follow-up in 4 weeks.  Nigel Mormon, MD [facility] [place] Pager: [phone] Office: [phone] If no answer Cell [phone]

## 2019-05-28 ENCOUNTER — Ambulatory Visit (INDEPENDENT_AMBULATORY_CARE_PROVIDER_SITE_OTHER): Payer: Medicare Other | Admitting: Cardiology

## 2019-05-28 ENCOUNTER — Ambulatory Visit: Payer: Medicare Other

## 2019-05-28 ENCOUNTER — Encounter: Payer: Self-pay | Admitting: Cardiology

## 2019-05-28 ENCOUNTER — Other Ambulatory Visit: Payer: Self-pay

## 2019-05-28 ENCOUNTER — Other Ambulatory Visit: Payer: Self-pay | Admitting: Cardiology

## 2019-05-28 VITALS — BP 160/89 | HR 61 | Temp 97.9°F | Ht 65.0 in | Wt 167.0 lb

## 2019-05-28 DIAGNOSIS — R0609 Other forms of dyspnea: Secondary | ICD-10-CM

## 2019-05-28 DIAGNOSIS — I5032 Chronic diastolic (congestive) heart failure: Secondary | ICD-10-CM | POA: Insufficient documentation

## 2019-05-28 DIAGNOSIS — R002 Palpitations: Secondary | ICD-10-CM

## 2019-05-28 DIAGNOSIS — I10 Essential (primary) hypertension: Secondary | ICD-10-CM | POA: Diagnosis not present

## 2019-05-28 DIAGNOSIS — I5033 Acute on chronic diastolic (congestive) heart failure: Secondary | ICD-10-CM

## 2019-05-28 DIAGNOSIS — I739 Peripheral vascular disease, unspecified: Secondary | ICD-10-CM | POA: Diagnosis not present

## 2019-05-28 MED ORDER — SPIRONOLACTONE 50 MG PO TABS: 50.0000 mg | ORAL_TABLET | Freq: Every day | ORAL | 1 refills | Status: DC

## 2019-06-05 ENCOUNTER — Other Ambulatory Visit: Payer: Self-pay

## 2019-06-05 ENCOUNTER — Ambulatory Visit: Payer: Medicare Other

## 2019-06-05 DIAGNOSIS — R0609 Other forms of dyspnea: Secondary | ICD-10-CM

## 2019-06-05 DIAGNOSIS — I10 Essential (primary) hypertension: Secondary | ICD-10-CM

## 2019-06-25 ENCOUNTER — Ambulatory Visit: Payer: Medicare Other | Admitting: Cardiology

## 2019-06-25 ENCOUNTER — Encounter: Payer: Self-pay | Admitting: Cardiology

## 2019-06-25 ENCOUNTER — Other Ambulatory Visit (HOSPITAL_COMMUNITY): Payer: Self-pay | Admitting: Cardiology

## 2019-06-25 ENCOUNTER — Other Ambulatory Visit: Payer: Self-pay

## 2019-06-25 VITALS — BP 155/81 | HR 69 | Temp 97.3°F | Ht 65.0 in | Wt 169.0 lb

## 2019-06-25 DIAGNOSIS — I5032 Chronic diastolic (congestive) heart failure: Secondary | ICD-10-CM

## 2019-06-25 DIAGNOSIS — I10 Essential (primary) hypertension: Secondary | ICD-10-CM

## 2019-06-25 DIAGNOSIS — I739 Peripheral vascular disease, unspecified: Secondary | ICD-10-CM

## 2019-06-25 MED ORDER — FUROSEMIDE 40 MG PO TABS: 40.0000 mg | ORAL_TABLET | Freq: Every day | ORAL | 3 refills | Status: DC

## 2019-06-25 NOTE — Progress Notes (Signed)
Subjective:   Jenna Murphy, female    DOB: Feb 18, 1928, 86 y.o.   MRN: [id number]   Chief complaint:  PAD  HPI   86 y/o [place] American female with hypertension, type 2 DM, exertional dyspnea.  At last visit in February 2021, she was mildly volume overloaded on exam.  I suspected HFpEF due to uncontrolled hypertension.  I increased spironolactone to 50 mg daily. Leg edema has improved, but not resolved yet. Blood pressure is improved.   Current Outpatient Medications on File Prior to Visit  Medication Sig Dispense Refill  . amLODipine (NORVASC) 5 MG tablet TAKE 1 TABLET BY MOUTH EVERY DAY 30 tablet 2  . atorvastatin (LIPITOR) 20 MG tablet Take 20 mg by mouth daily.    Marland Kitchen ibuprofen (ADVIL,MOTRIN) 400 MG tablet Take 1 tablet (400 mg total) by mouth every 8 (eight) hours as needed for mild pain. 20 tablet 0  . Insulin Glargine (TOUJEO SOLOSTAR [place]) Inject 20-25 Units into the skin See admin instructions. Inject 20 units subque in the morning and inject 2 units subque in the evening    . metFORMIN (GLUCOPHAGE) 500 MG tablet Take 500 mg by mouth 2 (two) times daily with a meal.     . metoprolol tartrate (LOPRESSOR) 50 MG tablet TAKE 1 AND 1/2 TABLETS BY MOUTH TWICE DAILY 180 tablet 2  . OXYGEN Inhale into the lungs.    Marland Kitchen spironolactone (ALDACTONE) 50 MG tablet TAKE 1 TABLET(50 MG) BY MOUTH DAILY 90 tablet 3   No current facility-administered medications on file prior to visit.     Cardiovascular studies:  Echocardiogram 06/05/2019:  1. Normal LV systolic function with visual EF 60-65%. Left ventricle  cavity is normal in size. Normal global wall motion. Doppler evidence of  grade I (impaired) diastolic dysfunction, elevated LAP. Mild left  ventricular hypertrophy. No obvious regional wall motion abnormalities.  Calculated EF 62%.  2. Left atrial cavity is mildly dilated.  3. Trileaflet aortic valve with no regurgitation. Trace aortic valve  stenosis.  4. Structurally normal  mitral valve. No evidence of mitral stenosis. Mild  (Grade I) mitral regurgitation.  5. No evidence of tricuspid stenosis. Mild tricuspid regurgitation. No  evidence of pulmonary hypertension.  6. Prior study dated 01/19/2018: LVEF 25%, grade 1 diastolic impairment,  mild concentric hypertrophy, elevated left atrial pressure, mildly dilated  left atrium, mild MR, mild TR.   EKG 04/11/2019: Sinus rhythm 98 bpm. RBBB. LAFB.   ABI 01/19/2018: This exam reveals mildly decreased perfusion of the right lower extremity, noted at the dorsalis pedis artery level (Rt ABI 0.82) and moderately decreased perfusion of the left lower extremity, noted at the dorsalis pedis artery level (Lt ABI 0.71).  Lexiscan myoview stress test 12/19/2017: 1. The resting electrocardiogram demonstrated normal sinus rhythm, LAD, LAFB, RBBB.  Stress EKG is non-diagnostic for ischemia as it a pharmacologic stress using Lexiscan. Stress symptoms included abdominal discomfort and headache. 2. Myocardial perfusion imaging is normal without ischemia or scar. Overall left ventricular systolic function was visually normal without regional wall motion abnormalities, however calculated at 34%.  3. This is an intermediate risk study, clinical correlation recommended.  Recent labs: 04/11/2019: Glucose 203, BUN/Cr 12/1.10. EGFR 44. Na/K 139/4.1.  H/H 11.8/39. MCV 83. Platelets 277  12/2015: HbA1C 8.2%  02/2008: Chol 116, TG 43, HDL 51, LDL 56   Review of Systems  Cardiovascular: Positive for dyspnea on exertion. Negative for chest pain, leg swelling, palpitations and syncope.  Vitals:   06/25/19 1331 06/25/19 1339  BP: (!) 155/93 (!) 155/81  Pulse: 77 69  Temp: (!) 97.3 F (36.3 C)   SpO2: 99%      Physical Exam: Physical Exam  Constitutional: She appears well-developed and well-nourished.  Neck: No JVD present.  Cardiovascular: Normal rate, regular rhythm, normal heart sounds and intact distal pulses.   No murmur heard. Pulmonary/Chest: Effort normal. She has no wheezes. She has no rales.  Musculoskeletal:        General: Edema (1+ b/l) present.  Nursing note and vitals reviewed.        Assessment & Recommendations:  86 y/o [place] female with hypertension, type 2 DM, exertional dyspnea  HFpEF: Improved.  Conitnue spironolactone to 50 mg daily. Added lasix 40 mg daily.  Check BMP today.  Hypertension: Improved, but not optimal yet. Hopefully, lasix will help.   Mildly abnormal ABI: Mild PAD.  No claudication symptoms.   Continue medical management. Recommend ASA 81 mg daily, and atorvastatin 20 mg daily.  Follow-up in 3 months.   Nigel Mormon, MD [facility] [place] Pager: [phone] Office: [phone] If no answer Cell [phone]

## 2019-06-26 LAB — BASIC METABOLIC PANEL
BUN/Creatinine Ratio: 12 (ref 12–28)
BUN: 13 mg/dL (ref 8–27)
CO2: 23 mmol/L (ref 20–29)
Calcium: 9.9 mg/dL (ref 8.7–10.3)
Chloride: 106 mmol/L (ref 96–106)
Creatinine, Ser: 1.07 mg/dL — ABNORMAL HIGH (ref 0.57–1.00)
GFR calc Af Amer: 53 mL/min/{1.73_m2} — ABNORMAL LOW (ref >59)
GFR calc non Af Amer: 46 mL/min/{1.73_m2} — ABNORMAL LOW (ref >59)
Glucose: 124 mg/dL — ABNORMAL HIGH (ref 65–99)
Potassium: 5 mmol/L (ref 3.5–5.2)
Sodium: 146 mmol/L — ABNORMAL HIGH (ref 134–144)

## 2019-06-27 NOTE — Progress Notes (Signed)
Patient is aware 

## 2019-07-05 ENCOUNTER — Other Ambulatory Visit: Payer: Self-pay | Admitting: Cardiology

## 2019-07-05 DIAGNOSIS — R0609 Other forms of dyspnea: Secondary | ICD-10-CM

## 2019-07-05 DIAGNOSIS — I10 Essential (primary) hypertension: Secondary | ICD-10-CM

## 2019-07-17 ENCOUNTER — Other Ambulatory Visit: Payer: Self-pay | Admitting: Cardiology

## 2019-09-12 ENCOUNTER — Other Ambulatory Visit: Payer: Self-pay | Admitting: Cardiology

## 2019-09-28 ENCOUNTER — Ambulatory Visit: Payer: Medicare Other | Admitting: Cardiology

## 2019-10-04 ENCOUNTER — Ambulatory Visit: Payer: Medicare Other | Admitting: Cardiology

## 2019-10-11 ENCOUNTER — Ambulatory Visit: Payer: Medicare Other | Admitting: Cardiology

## 2019-10-15 ENCOUNTER — Other Ambulatory Visit: Payer: Self-pay | Admitting: Cardiology

## 2019-10-25 ENCOUNTER — Encounter: Payer: Self-pay | Admitting: Cardiology

## 2019-10-25 ENCOUNTER — Ambulatory Visit: Payer: Medicare Other | Admitting: Cardiology

## 2019-10-25 ENCOUNTER — Other Ambulatory Visit: Payer: Self-pay

## 2019-10-25 VITALS — BP 180/86 | HR 59 | Ht 64.0 in | Wt 167.0 lb

## 2019-10-25 DIAGNOSIS — I739 Peripheral vascular disease, unspecified: Secondary | ICD-10-CM

## 2019-10-25 DIAGNOSIS — I10 Essential (primary) hypertension: Secondary | ICD-10-CM

## 2019-10-25 NOTE — Progress Notes (Signed)
Subjective:   Jenna Murphy, female    DOB: 09-07-1927, 86 y.o.   MRN: [id number]   Chief complaint:  PAD  HPI   86 y/o [place] American female with hypertension, type 2 DM  Patient is here for follow up visit. She has no complaints today. She is unsure of her blood pressure medications. Her daughter fills her pill box, who is not here with her today.   Medications could not be accurately reconciled.   Cardiovascular studies:  Echocardiogram 06/05/2019:  1. Normal LV systolic function with visual EF 60-65%. Left ventricle  cavity is normal in size. Normal global wall motion. Doppler evidence of  grade I (impaired) diastolic dysfunction, elevated LAP. Mild left  ventricular hypertrophy. No obvious regional wall motion abnormalities.  Calculated EF 62%.  2. Left atrial cavity is mildly dilated.  3. Trileaflet aortic valve with no regurgitation. Trace aortic valve  stenosis.  4. Structurally normal mitral valve. No evidence of mitral stenosis. Mild  (Grade I) mitral regurgitation.  5. No evidence of tricuspid stenosis. Mild tricuspid regurgitation. No  evidence of pulmonary hypertension.  6. Prior study dated 01/19/2018: LVEF 82%, grade 1 diastolic impairment,  mild concentric hypertrophy, elevated left atrial pressure, mildly dilated  left atrium, mild MR, mild TR.   EKG 04/11/2019: Sinus rhythm 98 bpm. RBBB. LAFB.   ABI 01/19/2018: This exam reveals mildly decreased perfusion of the right lower extremity, noted at the dorsalis pedis artery level (Rt ABI 0.82) and moderately decreased perfusion of the left lower extremity, noted at the dorsalis pedis artery level (Lt ABI 0.71).  Lexiscan myoview stress test 12/19/2017: 1. The resting electrocardiogram demonstrated normal sinus rhythm, LAD, LAFB, RBBB.  Stress EKG is non-diagnostic for ischemia as it a pharmacologic stress using Lexiscan. Stress symptoms included abdominal discomfort and headache. 2. Myocardial  perfusion imaging is normal without ischemia or scar. Overall left ventricular systolic function was visually normal without regional wall motion abnormalities, however calculated at 34%.  3. This is an intermediate risk study, clinical correlation recommended.  Recent labs: 06/25/2019: Glucose 124, UN/Cr 13/1.07. EGFR 53. Na/K 146/5.0.   04/11/2019: Glucose 203, BUN/Cr 12/1.10. EGFR 44. Na/K 139/4.1.  H/H 11.8/39. MCV 83. Platelets 277  12/2015: HbA1C 8.2%  02/2008: Chol 116, TG 43, HDL 51, LDL 56   Review of Systems  Cardiovascular: Positive for dyspnea on exertion. Negative for chest pain, leg swelling, palpitations and syncope.         Vitals:   10/25/19 1136  BP: (!) 180/86  Pulse: (!) 59  SpO2: 96%     Physical Exam: Physical Exam Vitals and nursing note reviewed.  Constitutional:      Appearance: She is well-developed.  Neck:     Vascular: No JVD.  Cardiovascular:     Rate and Rhythm: Normal rate and regular rhythm.     Pulses: Intact distal pulses.     Heart sounds: Normal heart sounds. No murmur heard.   Pulmonary:     Effort: Pulmonary effort is normal.     Breath sounds: No wheezing or rales.           Assessment & Recommendations:  86 y/o [place] American female with hypertension, type 2 DM, PAD  Hypertension: Uncontrolled. Patient is unsure about what medications she is currently taking.  Arranged for remote patient monitoring for hypertension through pur pharmacist Manuela Schwartz. Will review and reconcile meds. Could add Bidil in the future, if BP remains uncontrolled.    Mildly  abnormal ABI: Mild PAD.  No claudication symptoms.   Continue medical management. Recommend ASA 81 mg daily, and atorvastatin 20 mg daily.  Follow-up in 6 months.   Nigel Mormon, MD [facility] Pager: [phone] Office: [phone] If no answer Cell [phone]

## 2019-10-28 ENCOUNTER — Other Ambulatory Visit: Payer: Self-pay | Admitting: Cardiology

## 2019-10-28 DIAGNOSIS — I5032 Chronic diastolic (congestive) heart failure: Secondary | ICD-10-CM

## 2019-10-29 ENCOUNTER — Ambulatory Visit: Payer: Medicare Other | Admitting: Cardiology

## 2020-02-04 ENCOUNTER — Encounter (HOSPITAL_COMMUNITY): Payer: Self-pay | Admitting: Emergency Medicine

## 2020-02-04 ENCOUNTER — Emergency Department (HOSPITAL_COMMUNITY): Payer: Medicare Other

## 2020-02-04 ENCOUNTER — Inpatient Hospital Stay (HOSPITAL_COMMUNITY)
Admission: EM | Admit: 2020-02-04 | Discharge: 2020-02-07 | DRG: 291 | Disposition: A | Discharge status: Home / Self Care | Payer: Medicare Other | Attending: Internal Medicine | Admitting: Internal Medicine

## 2020-02-04 ENCOUNTER — Other Ambulatory Visit: Payer: Self-pay

## 2020-02-04 DIAGNOSIS — I5031 Acute diastolic (congestive) heart failure: Secondary | ICD-10-CM

## 2020-02-04 DIAGNOSIS — N1831 Chronic kidney disease, stage 3a: Secondary | ICD-10-CM

## 2020-02-04 DIAGNOSIS — N179 Acute kidney failure, unspecified: Principal | ICD-10-CM

## 2020-02-04 DIAGNOSIS — I5033 Acute on chronic diastolic (congestive) heart failure: Secondary | ICD-10-CM

## 2020-02-04 DIAGNOSIS — Z7984 Long term (current) use of oral hypoglycemic drugs: Secondary | ICD-10-CM

## 2020-02-04 DIAGNOSIS — Z96611 Presence of right artificial shoulder joint: Secondary | ICD-10-CM | POA: Diagnosis present

## 2020-02-04 DIAGNOSIS — Z8249 Family history of ischemic heart disease and other diseases of the circulatory system: Secondary | ICD-10-CM

## 2020-02-04 DIAGNOSIS — Z794 Long term (current) use of insulin: Secondary | ICD-10-CM

## 2020-02-04 DIAGNOSIS — I272 Pulmonary hypertension, unspecified: Secondary | ICD-10-CM | POA: Diagnosis present

## 2020-02-04 DIAGNOSIS — E1169 Type 2 diabetes mellitus with other specified complication: Secondary | ICD-10-CM

## 2020-02-04 DIAGNOSIS — E1122 Type 2 diabetes mellitus with diabetic chronic kidney disease: Secondary | ICD-10-CM

## 2020-02-04 DIAGNOSIS — I509 Heart failure, unspecified: Secondary | ICD-10-CM | POA: Diagnosis not present

## 2020-02-04 DIAGNOSIS — Z23 Encounter for immunization: Secondary | ICD-10-CM

## 2020-02-04 DIAGNOSIS — I130 Hypertensive heart and chronic kidney disease with heart failure and stage 1 through stage 4 chronic kidney disease, or unspecified chronic kidney disease: Secondary | ICD-10-CM | POA: Diagnosis not present

## 2020-02-04 DIAGNOSIS — Z20822 Contact with and (suspected) exposure to covid-19: Secondary | ICD-10-CM | POA: Diagnosis present

## 2020-02-04 DIAGNOSIS — R0600 Dyspnea, unspecified: Secondary | ICD-10-CM | POA: Diagnosis present

## 2020-02-04 DIAGNOSIS — J9811 Atelectasis: Secondary | ICD-10-CM | POA: Diagnosis present

## 2020-02-04 DIAGNOSIS — Z96612 Presence of left artificial shoulder joint: Secondary | ICD-10-CM | POA: Diagnosis present

## 2020-02-04 DIAGNOSIS — J9600 Acute respiratory failure, unspecified whether with hypoxia or hypercapnia: Secondary | ICD-10-CM | POA: Diagnosis present

## 2020-02-04 DIAGNOSIS — E785 Hyperlipidemia, unspecified: Secondary | ICD-10-CM | POA: Diagnosis present

## 2020-02-04 DIAGNOSIS — E11649 Type 2 diabetes mellitus with hypoglycemia without coma: Secondary | ICD-10-CM | POA: Diagnosis present

## 2020-02-04 DIAGNOSIS — R0609 Other forms of dyspnea: Secondary | ICD-10-CM | POA: Diagnosis present

## 2020-02-04 DIAGNOSIS — Z87891 Personal history of nicotine dependence: Secondary | ICD-10-CM

## 2020-02-04 LAB — URINALYSIS, ROUTINE W REFLEX MICROSCOPIC
Bilirubin Urine: NEGATIVE
Glucose, UA: NEGATIVE mg/dL
Hgb urine dipstick: NEGATIVE
Ketones, ur: NEGATIVE mg/dL
Nitrite: NEGATIVE
Protein, ur: NEGATIVE mg/dL
Specific Gravity, Urine: 1.008 (ref 1.005–1.030)
pH: 5 (ref 5.0–8.0)

## 2020-02-04 LAB — CBG MONITORING, ED
Glucose-Capillary: 36 mg/dL — CL (ref 70–99)
Glucose-Capillary: 104 mg/dL — ABNORMAL HIGH (ref 70–99)
Glucose-Capillary: 206 mg/dL — ABNORMAL HIGH (ref 70–99)

## 2020-02-04 LAB — BASIC METABOLIC PANEL
Anion gap: 12 (ref 5–15)
BUN: 26 mg/dL — ABNORMAL HIGH (ref 8–23)
CO2: 23 mmol/L (ref 22–32)
Calcium: 9.8 mg/dL (ref 8.9–10.3)
Chloride: 106 mmol/L (ref 98–111)
Creatinine, Ser: 1.82 mg/dL — ABNORMAL HIGH (ref 0.44–1.00)
GFR, Estimated: 26 mL/min — ABNORMAL LOW (ref >60)
Glucose, Bld: 157 mg/dL — ABNORMAL HIGH (ref 70–99)
Potassium: 4.3 mmol/L (ref 3.5–5.1)
Sodium: 141 mmol/L (ref 135–145)

## 2020-02-04 LAB — CBC
HCT: 36.9 % (ref 36.0–46.0)
Hemoglobin: 11.3 g/dL — ABNORMAL LOW (ref 12.0–15.0)
MCH: 25.1 pg — ABNORMAL LOW (ref 26.0–34.0)
MCHC: 30.6 g/dL (ref 30.0–36.0)
MCV: 81.8 fL (ref 80.0–100.0)
Platelets: 299 10*3/uL (ref 150–400)
RBC: 4.51 MIL/uL (ref 3.87–5.11)
RDW: 14.6 % (ref 11.5–15.5)
WBC: 7.9 10*3/uL (ref 4.0–10.5)
nRBC: 0 % (ref 0.0–0.2)

## 2020-02-04 LAB — GLUCOSE, CAPILLARY: Glucose-Capillary: 110 mg/dL — ABNORMAL HIGH (ref 70–99)

## 2020-02-04 LAB — TROPONIN I (HIGH SENSITIVITY): Troponin I (High Sensitivity): 11 ng/L (ref <18)

## 2020-02-04 LAB — RESPIRATORY PANEL BY RT PCR (FLU A&B, COVID)
Influenza A by PCR: NEGATIVE
Influenza B by PCR: NEGATIVE
SARS Coronavirus 2 by RT PCR: NEGATIVE

## 2020-02-04 LAB — BRAIN NATRIURETIC PEPTIDE: B Natriuretic Peptide: 136 pg/mL — ABNORMAL HIGH (ref 0.0–100.0)

## 2020-02-04 LAB — D-DIMER, QUANTITATIVE: D-Dimer, Quant: 1.07 ug/mL-FEU — ABNORMAL HIGH (ref 0.00–0.50)

## 2020-02-04 MED ORDER — ONDANSETRON HCL 4 MG PO TABS: 4.0000 mg | ORAL_TABLET | Freq: Four times a day (QID) | ORAL | Status: DC | PRN

## 2020-02-04 MED ORDER — HEPARIN SODIUM (PORCINE) 5000 UNIT/ML IJ SOLN
5000.0000 [IU] | Freq: Three times a day (TID) | INTRAMUSCULAR | Status: DC
  Administered 2020-02-04 – 2020-02-07 (×8): 5000 [IU] via SUBCUTANEOUS
  Filled 2020-02-04 (×8): qty 1

## 2020-02-04 MED ORDER — ATORVASTATIN CALCIUM 10 MG PO TABS
20.0000 mg | ORAL_TABLET | Freq: Every day | ORAL | Status: DC
  Administered 2020-02-04 – 2020-02-07 (×4): 20 mg via ORAL
  Filled 2020-02-04 (×4): qty 2

## 2020-02-04 MED ORDER — METOPROLOL TARTRATE 50 MG PO TABS
75.0000 mg | ORAL_TABLET | Freq: Two times a day (BID) | ORAL | Status: DC
  Administered 2020-02-04 – 2020-02-07 (×6): 75 mg via ORAL
  Filled 2020-02-04 (×6): qty 1

## 2020-02-04 MED ORDER — FUROSEMIDE 10 MG/ML IJ SOLN
40.0000 mg | Freq: Once | INTRAMUSCULAR | Status: AC
  Administered 2020-02-04: 40 mg via INTRAVENOUS
  Filled 2020-02-04: qty 4

## 2020-02-04 MED ORDER — ACETAMINOPHEN 650 MG RE SUPP: 650.0000 mg | Freq: Four times a day (QID) | RECTAL | Status: DC | PRN

## 2020-02-04 MED ORDER — ONDANSETRON HCL 4 MG/2ML IJ SOLN: 4.0000 mg | Freq: Four times a day (QID) | INTRAMUSCULAR | Status: DC | PRN

## 2020-02-04 MED ORDER — INSULIN ASPART 100 UNIT/ML ~~LOC~~ SOLN
0.0000 [IU] | Freq: Three times a day (TID) | SUBCUTANEOUS | Status: DC
  Administered 2020-02-05: 1 [IU] via SUBCUTANEOUS

## 2020-02-04 MED ORDER — ACETAMINOPHEN 325 MG PO TABS: 650.0000 mg | ORAL_TABLET | Freq: Four times a day (QID) | ORAL | Status: DC | PRN

## 2020-02-04 NOTE — H&P (Addendum)
History and Physical    Jenna Murphy NFA:[id number] DOB: 1927/08/17 DOA: 02/04/2020  I have briefly reviewed the patient's prior medical records in [facility]  PCP: Andi Devon, MD  Patient coming from: home  Chief Complaint: Shortness of breath  HPI: Jenna Murphy is a 86 y.o. female with medical history significant of type 2 diabetes mellitus, hypertension, hyperlipidemia, CKD 3A, chronic diastolic CHF comes into the hospital with complaints of shortness of breath.  Patient tells me that this has been somewhat chronic but has progressed in the last 3 weeks, family has noticed that when she walks she gets pretty short of breath, is breathing fast, and her family insisted that she comes to the ED today to get this checked out.  She denies any chest pain, denies any palpitations.  She denies any fever or chills.  She denies any cough or chest congestion.  She reports compliance with her home medications, denies any weight gain or weight loss.  She denies any swelling in her legs but has not paid attention.  ED Course: In the ED she is afebrile, slightly tachypneic at times, normotensive and her sats are good on room air.  Blood work reveals a creatinine of 1.8, BNP elevated 136, high-sensitivity troponin negative.  EKG shows sinus rhythm.  Her Covid is negative, her chest x-ray was without acute findings.  We are asked to admit for dyspnea on exertion, acute kidney injury  Review of Systems: All systems reviewed, and apart from HPI, all negative  Past Medical History:  Diagnosis Date  . Arthritis   . Carpal tunnel syndrome of left wrist   . Diabetes mellitus    not on insulin  . Gallstone pancreatitis 02/2008  . Heart murmur   . Hypertension   . Polio    as child     Past Surgical History:  Procedure Laterality Date  . CHOLECYSTECTOMY    . REVERSE SHOULDER ARTHROPLASTY Left 05/23/2014   Procedure: REVERSE SHOULDER ARTHROPLASTY;  Surgeon: Mable Paris, MD;   Location: [facility] OR;  Service: Orthopedics;  Laterality: Left;  . REVERSE SHOULDER ARTHROPLASTY Right 12/11/2015   Procedure: REVERSE SHOULDER ARTHROPLASTY;  Surgeon: Jones Broom, MD;  Location: [facility] OR;  Service: Orthopedics;  Laterality: Right;  Right reverse total shoulder arthroplasty  . STERIOD INJECTION Left 05/23/2014   Procedure: STEROID INJECTION THUMB;  Surgeon: Mable Paris, MD;  Location: [facility] OR;  Service: Orthopedics;  Laterality: Left;  Steroid injection left thumb     reports that she quit smoking about 51 years ago. Her smoking use included cigarettes. She has a 20.00 pack-year smoking history. She has never used smokeless tobacco. She reports that she does not drink alcohol and does not use drugs.  No Known Allergies  Family History  Problem Relation Age of Onset  . Heart attack Mother        Died of MI, dec at 62   . Cirrhosis Father        alcohol induced     Prior to Admission medications   Medication Sig Start Date End Date Taking? Authorizing Provider  amLODipine (NORVASC) 5 MG tablet TAKE 1 TABLET BY MOUTH EVERY DAY Patient taking differently: Take 5 mg by mouth daily.  09/12/19   Patwardhan, Anabel Bene, MD  atorvastatin (LIPITOR) 20 MG tablet Take 20 mg by mouth daily.    [provider]  furosemide (LASIX) 40 MG tablet TAKE 1 TABLET(40 MG) BY MOUTH DAILY Patient taking differently: Take 40  mg by mouth daily.  10/29/19   Patwardhan, Anabel Bene, MD  ibuprofen (ADVIL,MOTRIN) 400 MG tablet Take 1 tablet (400 mg total) by mouth every 8 (eight) hours as needed for mild pain. 09/23/16   Derwood Kaplan, MD  Insulin Glargine (TOUJEO SOLOSTAR [place]) Inject 30-35 Units into the skin See admin instructions. Inject 20 units subque in the morning and inject 2 units subque in the evening    [provider]  metFORMIN (GLUCOPHAGE) 500 MG tablet Take 500 mg by mouth 2 (two) times daily with a meal.     [provider]  metoprolol tartrate (LOPRESSOR) 50 MG  tablet TAKE 1 AND 1/2 TABLETS BY MOUTH TWICE DAILY 10/16/19   Patwardhan, Anabel Bene, MD  OXYGEN Inhale into the lungs.    [provider]  spironolactone (ALDACTONE) 25 MG tablet TAKE 1 TABLET BY MOUTH EVERY MORNING 07/17/19   Patwardhan, Manish J, MD  spironolactone (ALDACTONE) 50 MG tablet TAKE 1 TABLET(50 MG) BY MOUTH DAILY Patient taking differently: Take 50 mg by mouth daily.  07/06/19   Yates Decamp, MD  TOUJEO SOLOSTAR 300 UNIT/ML Solostar Pen Inject 30-35 Units into the skin daily. 09/04/19   [provider]    Physical Exam: Vitals:   02/04/20 1137 02/04/20 1229 02/04/20 1230 02/04/20 1245  BP: 127/67  (!) 157/94 (!) 155/80  Pulse: 62  64 63  Resp: 16  19 16   Temp: 98.1 F (36.7 C)     TempSrc: Oral     SpO2: 99%  95% 94%  Weight:  73.5 kg    Height:  5\' 4"  (1.626 m)        Constitutional: NAD, calm, comfortable Eyes: PERRL, lids and conjunctivae normal ENMT: Mucous membranes are moist. Posterior pharynx clear of any exudate or lesions.Normal dentition.  Neck: normal, supple Respiratory: Bibasilar crackles to midlung fields, no wheezing, moves air well Cardiovascular: Regular rate and rhythm, no murmurs / rubs / gallops.  Trace pitting bilateral lower extremity edema Abdomen: no tenderness, no masses palpated. Bowel sounds positive.  Musculoskeletal: no clubbing / cyanosis. Normal muscle tone.  Skin: no rashes Neurologic: CN 2-12 grossly intact. Strength 5/5 in all 4.  Psychiatric: Normal judgment and insight. Alert and oriented x 3. Normal mood.   Labs on Admission: I have personally reviewed following labs and imaging studies  CBC: Recent Labs  Lab 02/04/20 1143  WBC 7.9  HGB 11.3*  HCT 36.9  MCV 81.8  PLT 299   Basic Metabolic Panel: Recent Labs  Lab 02/04/20 1143  NA 141  K 4.3  CL 106  CO2 23  GLUCOSE 157*  BUN 26*  CREATININE 1.82*  CALCIUM 9.8   Liver Function Tests: No results for input(s): AST, ALT, ALKPHOS, BILITOT, PROT,  ALBUMIN in the last 168 hours. Coagulation Profile: No results for input(s): INR, PROTIME in the last 168 hours. BNP (last 3 results) No results for input(s): PROBNP in the last 8760 hours. CBG: No results for input(s): GLUCAP in the last 168 hours. Thyroid Function Tests: No results for input(s): TSH, T4TOTAL, FREET4, T3FREE, THYROIDAB in the last 72 hours. Urine analysis:    Component Value Date/Time   COLORURINE YELLOW 12/11/2015 0548   APPEARANCEUR CLEAR 12/11/2015 0548   LABSPEC 1.011 12/11/2015 0548   PHURINE 6.0 12/11/2015 0548   GLUCOSEU NEGATIVE 12/11/2015 0548   HGBUR NEGATIVE 12/11/2015 0548   BILIRUBINUR NEGATIVE 12/11/2015 0548   KETONESUR NEGATIVE 12/11/2015 0548   PROTEINUR NEGATIVE 12/11/2015 0548   UROBILINOGEN  0.2 05/15/2014 0858   NITRITE NEGATIVE 12/11/2015 0548   LEUKOCYTESUR TRACE (A) 12/11/2015 0548     Radiological Exams on Admission: DG Chest 2 View  Result Date: 02/04/2020 CLINICAL DATA:  Shortness of breath EXAM: CHEST - 2 VIEW COMPARISON:  04/11/2019 FINDINGS: Chronic interstitial prominence. No new consolidation or edema. No pleural effusion or pneumothorax. Stable cardiomediastinal contours with mild cardiomegaly. No acute osseous abnormality. Bilateral reverse shoulder arthroplasties. IMPRESSION: No acute process in the chest. Chronic interstitial changes and stable cardiomegaly. Electronically Signed   By: Guadlupe Spanish M.D.   On: 02/04/2020 11:59    Assessment/Plan  Principal Problem Dyspnea on exertion, suspect acute on chronic diastolic CHF -Patient has a history of chronic dyspnea as noted in outpatient cardiology notes, but over the last 3 weeks this is gotten worse to the point that family noticed a change in her breathing with activities.  She looks slightly fluid overloaded with lower extremity edema as well as bibasilar crackles on lung exam, will give IV Lasix x1 and monitor renal function as well as respiratory status -Updated 2D  echo  Active Problems Acute kidney injury on chronic kidney disease stage IIIa -Patient's baseline creatinine around 1.2, currently at 1.8.  Will give IV Lasix x1 as above, monitor renal function in the morning.  Hold nephrotoxic agents.  Obtain UA.  Hold Metformin  Type 2 diabetes mellitus -Placed on sliding scale, hold long-acting insulin due to an episode of hypoglycemia  Essential hypertension -Resume home metoprolol  Hyperlipidemia -Resume home statin  DVT prophylaxis: heparin  Code Status: Full code per patient  Family Communication: no family at bedside  Disposition Plan: home when ready  Bed Type: telemetry  Consults called: none Obs/Inp: obs   Pamella Pert, MD, PhD [facility]  Contact via [email or web address]  02/04/2020, 3:11 PM

## 2020-02-04 NOTE — ED Notes (Signed)
Pt with CBG of 36, given orange juice and snacks. Pt remains alert and oriented. She said she did not feel like her blood sugar was low. Family reported she has not eaten since this morning.   Dr Pamella Pert was notified.

## 2020-02-04 NOTE — ED Notes (Signed)
Informed MD of d-dimer.

## 2020-02-04 NOTE — ED Triage Notes (Signed)
Pt here from home with c/o intermittent sob . Feels fine today but states that she was really sob yesterday , no chest pain , fully vaccinated

## 2020-02-04 NOTE — ED Notes (Signed)
Pt ambulated to bathroom with cane.

## 2020-02-04 NOTE — ED Provider Notes (Signed)
[facility] EMERGENCY DEPARTMENT Provider Note   CSN: [id number] Arrival date & time: 02/04/20  1129     History No chief complaint on file.   Jenna Murphy is a 86 y.o. female.  The history is provided by the patient.  Shortness of Breath Severity:  Moderate Onset quality:  Gradual Duration:  2 weeks Timing:  Intermittent Progression:  Worsening Chronicity:  New Relieved by:  Nothing Worsened by:  Exertion Associated symptoms: no abdominal pain, no chest pain, no cough, no ear pain, no fever, no rash, no sore throat and no vomiting        Past Medical History:  Diagnosis Date  . Arthritis   . Carpal tunnel syndrome of left wrist   . Diabetes mellitus    not on insulin  . Gallstone pancreatitis 02/2008  . Heart murmur   . Hypertension   . Polio    as child     Patient Active Problem List   Diagnosis Date Noted  . CHF (congestive heart failure) (HCC) 02/04/2020  . Palpitations 05/28/2019  . Chronic heart failure with preserved ejection fraction (HCC) 05/28/2019  . Exertional dyspnea 05/27/2019  . PAD (peripheral artery disease) (HCC) 09/25/2018  . 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 Chest pain 05/22/2011  . Essential hypertension 05/22/2011    Past Surgical History:  Procedure Laterality Date  . CHOLECYSTECTOMY    . REVERSE SHOULDER ARTHROPLASTY Left 05/23/2014   Procedure: REVERSE SHOULDER ARTHROPLASTY;  Surgeon: Mable Paris, MD;  Location: [facility] OR;  Service: Orthopedics;  Laterality: Left;  . REVERSE SHOULDER ARTHROPLASTY Right 12/11/2015   Procedure: REVERSE SHOULDER ARTHROPLASTY;  Surgeon: Jones Broom, MD;  Location: [facility] OR;  Service: Orthopedics;  Laterality: Right;  Right reverse total shoulder arthroplasty  . STERIOD INJECTION Left 05/23/2014   Procedure: STEROID INJECTION THUMB;  Surgeon: Mable Paris, MD;  Location: [facility] OR;  Service: Orthopedics;  Laterality: Left;  Steroid injection left  thumb     OB History   No obstetric history on file.     Family History  Problem Relation Age of Onset  . Heart attack Mother        Died of MI, dec at 28   . Cirrhosis Father        alcohol induced     Social History   Tobacco Use  . Smoking status: Former Smoker    Packs/day: 1.00    Years: 20.00    Pack years: 20.00    Types: Cigarettes    Quit date: 04/12/1968    Years since quitting: 51.8  . Smokeless tobacco: Never Used  Substance Use Topics  . Alcohol use: No    Alcohol/week: 0.0 standard drinks  . Drug use: No    Home Medications Prior to Admission medications   Medication Sig Start Date End Date Taking? Authorizing Provider  amLODipine (NORVASC) 5 MG tablet TAKE 1 TABLET BY MOUTH EVERY DAY Patient taking differently: Take 5 mg by mouth daily.  09/12/19   Patwardhan, Anabel Bene, MD  atorvastatin (LIPITOR) 20 MG tablet Take 20 mg by mouth daily.    [provider]  furosemide (LASIX) 40 MG tablet TAKE 1 TABLET(40 MG) BY MOUTH DAILY Patient taking differently: Take 40 mg by mouth daily.  10/29/19   Patwardhan, Anabel Bene, MD  ibuprofen (ADVIL,MOTRIN) 400 MG tablet Take 1 tablet (400 mg total) by mouth every 8 (eight) hours as needed for mild pain. 09/23/16   Derwood Kaplan, MD  Insulin Glargine (TOUJEO SOLOSTAR [place]) Inject 30-35 Units into the skin See admin instructions. Inject 20 units subque in the morning and inject 2 units subque in the evening    [provider]  metFORMIN (GLUCOPHAGE) 500 MG tablet Take 500 mg by mouth 2 (two) times daily with a meal.     [provider]  metoprolol tartrate (LOPRESSOR) 50 MG tablet TAKE 1 AND 1/2 TABLETS BY MOUTH TWICE DAILY 10/16/19   Patwardhan, Anabel Bene, MD  OXYGEN Inhale into the lungs.    [provider]  spironolactone (ALDACTONE) 25 MG tablet TAKE 1 TABLET BY MOUTH EVERY MORNING 07/17/19   Patwardhan, Manish J, MD  spironolactone (ALDACTONE) 50 MG tablet TAKE 1 TABLET(50 MG) BY MOUTH  DAILY Patient taking differently: Take 50 mg by mouth daily.  07/06/19   Yates Decamp, MD  TOUJEO SOLOSTAR 300 UNIT/ML Solostar Pen Inject 30-35 Units into the skin daily. 09/04/19   [provider]    Allergies    Patient has no known allergies.  Review of Systems   Review of Systems  Constitutional: Positive for fatigue. Negative for chills and fever.  HENT: Negative for ear pain and sore throat.   Eyes: Negative for pain and visual disturbance.  Respiratory: Positive for shortness of breath. Negative for cough.   Cardiovascular: Negative for chest pain and palpitations.  Gastrointestinal: Negative for abdominal pain and vomiting.  Genitourinary: Negative for dysuria and hematuria.  Musculoskeletal: Negative for arthralgias and back pain.  Skin: Negative for color change and rash.  Neurological: Negative for seizures and syncope.  All other systems reviewed and are negative.   Physical Exam Updated Vital Signs BP (!) 155/80   Pulse 63   Temp 98.1 F (36.7 C) (Oral)   Resp 16   Ht 5\' 4"  (1.626 m)   Wt 73.5 kg   SpO2 94%   BMI 27.81 kg/m   Physical Exam Vitals and nursing note reviewed.  Constitutional:      Appearance: She is well-developed. She is not ill-appearing, toxic-appearing or diaphoretic.  HENT:     Head: Normocephalic and atraumatic.     Mouth/Throat:     Mouth: Mucous membranes are moist.     Pharynx: Oropharynx is clear.  Eyes:     Conjunctiva/sclera: Conjunctivae normal.  Cardiovascular:     Rate and Rhythm: Normal rate and regular rhythm.     Heart sounds: No murmur heard.  No gallop.   Pulmonary:     Effort: Pulmonary effort is normal. No respiratory distress.     Breath sounds: Examination of the right-middle field reveals rales. Examination of the left-middle field reveals rales. Examination of the right-lower field reveals rales. Examination of the left-lower field reveals rales. Rales present.  Abdominal:     Palpations: Abdomen is  soft.     Tenderness: There is no abdominal tenderness.  Musculoskeletal:     Cervical back: Neck supple.     Right lower leg: 1+ Pitting Edema present.     Left lower leg: 1+ Pitting Edema present.  Skin:    General: Skin is warm and dry.  Neurological:     Mental Status: She is alert. Mental status is at baseline.     ED Results / Procedures / Treatments   Labs (all labs ordered are listed, but only abnormal results are displayed) Labs Reviewed  BASIC METABOLIC PANEL - Abnormal; Notable for the following components:      Result Value   Glucose, Bld 157 (*)  BUN 26 (*)    Creatinine, Ser 1.82 (*)    GFR, Estimated 26 (*)    All other components within normal limits  CBC - Abnormal; Notable for the following components:   Hemoglobin 11.3 (*)    MCH 25.1 (*)    All other components within normal limits  BRAIN NATRIURETIC PEPTIDE - Abnormal; Notable for the following components:   B Natriuretic Peptide 136.0 (*)    All other components within normal limits  RESPIRATORY PANEL BY RT PCR (FLU A&B, COVID)  URINE CULTURE  URINALYSIS, ROUTINE W REFLEX MICROSCOPIC  D-DIMER, QUANTITATIVE (NOT AT [facility])  TROPONIN I (HIGH SENSITIVITY)    EKG EKG Interpretation  Date/Time:  Monday February 04 2020 11:35:58 EDT Ventricular Rate:  65 PR Interval:  166 QRS Duration: 126 QT Interval:  448 QTC Calculation: 465 R Axis:   -75 Text Interpretation: Sinus rhythm with Premature supraventricular complexes Right bundle branch block Left anterior fascicular block Minimal voltage criteria for LVH, may be normal variant ( R in aVL ) Septal infarct , age undetermined No significant change since last tracing Confirmed by Gwyneth Sprout ([id number]) on 02/04/2020 12:34:39 PM   Radiology DG Chest 2 View  Result Date: 02/04/2020 CLINICAL DATA:  Shortness of breath EXAM: CHEST - 2 VIEW COMPARISON:  04/11/2019 FINDINGS: Chronic interstitial prominence. No new consolidation or edema. No pleural  effusion or pneumothorax. Stable cardiomediastinal contours with mild cardiomegaly. No acute osseous abnormality. Bilateral reverse shoulder arthroplasties. IMPRESSION: No acute process in the chest. Chronic interstitial changes and stable cardiomegaly. Electronically Signed   By: Guadlupe Spanish M.D.   On: 02/04/2020 11:59    Procedures Procedures (including critical care time)  Medications Ordered in ED Medications  insulin aspart (novoLOG) injection 0-9 Units (has no administration in time range)    ED Course  I have reviewed the triage vital signs and the nursing notes.  Pertinent labs & imaging results that were available during my care of the patient were reviewed by me and considered in my medical decision making (see chart for details).    MDM Rules/Calculators/A&P                          The patient is a 86yo female, PMH DM, HTN, HFpEF who presents to the ED for SOB.  On my initial evaluation, the patient is hemodynamically stable, afebrile, nontoxic-appearing. Physical exam remarkable for rales and BLE edema.  EKG with RBBB, nonspecific T wave changes in inferior leads, no reciprocal changes. QTc . CXR with cardiomegaly.  Labs remarkable for mildly elevated BNP, AKI, negative Covid.  Patient's presentation and labs concerning for heart failure exacerbation potentially causing AKI.  Patient denies decreased p.o. intake, denies obstructive symptoms, denies new nephrotoxic medications. Unclear if there is another etiology for AKI. Due to AKI, will defer lasix or other treatment to inpatient team.   Consulted internal medicine for admission for possible CHF exacerbation and AKI. Patient admitted in stable condition.   The care of this patient was overseen by Dr. Anitra Lauth, who agreed with evaluation and plan of care.   Final Clinical Impression(s) / ED Diagnoses Final diagnoses:  AKI (acute kidney injury) [facility])    Rx / DC Orders ED Discharge Orders    None        Loletha Carrow, MD 02/04/20 1516    Gwyneth Sprout, MD 02/07/20 2231

## 2020-02-05 ENCOUNTER — Observation Stay (HOSPITAL_COMMUNITY): Payer: Medicare Other

## 2020-02-05 ENCOUNTER — Observation Stay (HOSPITAL_BASED_OUTPATIENT_CLINIC_OR_DEPARTMENT_OTHER): Payer: Medicare Other

## 2020-02-05 DIAGNOSIS — R0600 Dyspnea, unspecified: Secondary | ICD-10-CM | POA: Diagnosis not present

## 2020-02-05 DIAGNOSIS — R609 Edema, unspecified: Secondary | ICD-10-CM

## 2020-02-05 DIAGNOSIS — N179 Acute kidney failure, unspecified: Secondary | ICD-10-CM | POA: Diagnosis not present

## 2020-02-05 LAB — GLUCOSE, CAPILLARY
Glucose-Capillary: 74 mg/dL (ref 70–99)
Glucose-Capillary: 114 mg/dL — ABNORMAL HIGH (ref 70–99)
Glucose-Capillary: 133 mg/dL — ABNORMAL HIGH (ref 70–99)
Glucose-Capillary: 126 mg/dL — ABNORMAL HIGH (ref 70–99)

## 2020-02-05 LAB — COMPREHENSIVE METABOLIC PANEL
ALT: 14 U/L (ref 0–44)
AST: 19 U/L (ref 15–41)
Albumin: 3.5 g/dL (ref 3.5–5.0)
Alkaline Phosphatase: 83 U/L (ref 38–126)
Anion gap: 12 (ref 5–15)
BUN: 25 mg/dL — ABNORMAL HIGH (ref 8–23)
CO2: 25 mmol/L (ref 22–32)
Calcium: 9.4 mg/dL (ref 8.9–10.3)
Chloride: 104 mmol/L (ref 98–111)
Creatinine, Ser: 1.61 mg/dL — ABNORMAL HIGH (ref 0.44–1.00)
GFR, Estimated: 30 mL/min — ABNORMAL LOW (ref >60)
Glucose, Bld: 65 mg/dL — ABNORMAL LOW (ref 70–99)
Potassium: 3.7 mmol/L (ref 3.5–5.1)
Sodium: 141 mmol/L (ref 135–145)
Total Bilirubin: 1 mg/dL (ref 0.3–1.2)
Total Protein: 7.1 g/dL (ref 6.5–8.1)

## 2020-02-05 LAB — ECHOCARDIOGRAM COMPLETE
Area-P 1/2: 1.92 cm2
Height: 64 in
S' Lateral: 2.6 cm
Weight: 2528 oz

## 2020-02-05 LAB — CBC
HCT: 37.5 % (ref 36.0–46.0)
Hemoglobin: 11.8 g/dL — ABNORMAL LOW (ref 12.0–15.0)
MCH: 25.3 pg — ABNORMAL LOW (ref 26.0–34.0)
MCHC: 31.5 g/dL (ref 30.0–36.0)
MCV: 80.3 fL (ref 80.0–100.0)
Platelets: 301 10*3/uL (ref 150–400)
RBC: 4.67 MIL/uL (ref 3.87–5.11)
RDW: 14.6 % (ref 11.5–15.5)
WBC: 7.9 10*3/uL (ref 4.0–10.5)
nRBC: 0 % (ref 0.0–0.2)

## 2020-02-05 LAB — D-DIMER, QUANTITATIVE: D-Dimer, Quant: 1.17 ug/mL-FEU — ABNORMAL HIGH (ref 0.00–0.50)

## 2020-02-05 MED ORDER — TECHNETIUM TO 99M ALBUMIN AGGREGATED
4.2000 | Freq: Once | INTRAVENOUS | Status: AC | PRN
  Administered 2020-02-05: 4.2 via INTRAVENOUS

## 2020-02-05 MED ORDER — PNEUMOCOCCAL VAC POLYVALENT 25 MCG/0.5ML IJ INJ
0.5000 mL | INJECTION | INTRAMUSCULAR | Status: AC
  Administered 2020-02-06: 0.5 mL via INTRAMUSCULAR
  Filled 2020-02-05: qty 0.5

## 2020-02-05 MED ORDER — INFLUENZA VAC A&B SA ADJ QUAD 0.5 ML IM PRSY
0.5000 mL | PREFILLED_SYRINGE | INTRAMUSCULAR | Status: AC
  Administered 2020-02-06: 0.5 mL via INTRAMUSCULAR
  Filled 2020-02-05: qty 0.5

## 2020-02-05 MED ORDER — SODIUM CHLORIDE 0.9 % IV SOLN: INTRAVENOUS | Status: DC

## 2020-02-05 NOTE — Progress Notes (Signed)
Lower extremity venous bilateral study completed.   Please see CV Proc for preliminary results.   Mercy Leppla, RDMS  

## 2020-02-05 NOTE — Progress Notes (Signed)
  Echocardiogram 2D Echocardiogram has been performed.  Jenna Murphy 02/05/2020, 11:30 AM

## 2020-02-05 NOTE — Progress Notes (Addendum)
[facility] PROGRESS NOTE    Progress Note  Jenna RivalJean D Throne  ZOX:[id number] DOB: 1928-03-04 DOA: 02/04/2020 PCP: Andi DevonShelton, Kimberly, MD     Brief Narrative:   Jenna Murphy is an 86 y.o. female past medical history of diabetes mellitus type 2, chronic kidney disease stage IIIa and creatinine of around 1, chronic diastolic heart failure comes into the hospital complaining of shortness of breath chest x-ray shows just chronic changes, BNP of 136, she is 71 kg (compared to 06/25/2019 she was 76 kg) he has remained afebrile with no leukocytosis her D-dimer was 1.1.  Assessment/Plan:   Dyspnea on exertion of unclear etiology: Last 2D echo showed an EF of 60% with grade 1 diastolic failure with mild grade 1 mitral regurgitation. Going back through her history her cardiologist has evaluated her for chronic dyspnea as an outpatient.  She relates that over the last 3 weeks it has gotten worse, she was started on Lasix as an outpatient about 3 weeks ago, she relates she had a trip about a month ago to [place] with more 4 hours drive. Chest x-ray shows no acute findings but chronic changes, with a BNP of 136, her creatinine is slightly elevated, with no JVD on physical exam and asymmetric edema, she is unlikely to be volume overloaded,  will hold Lasix. Lung exam she has  dry cackles not clear with coughing. I will go ahead and start her on IV fluids, as her creatinine improves will probably get a CT angio of chest to rule out PE, VQ scan has been ordered which will probably be inconclusive due to the chronic changes in chest x-ray. Get a lower extremity Doppler to rule out DVT and when her creatinine improves can do a CT angio to rule out a PE, also CT angio with contrast will help us evaluate lung parenchyma to rule out interstitial lung disease or any other pathological condition.  Acute Kidney injury on chronic kidney disease stage IIIa: With a baseline creatinine of around 1.1 on  admission 1.6 she was given IV Lasix on admission. Stop Lasix start her on IV fluids recheck basic metabolic panel in the morning.    DVT prophylaxis: lovenox Family Communication:Daughter Status is: Observation  The patient remains OBS appropriate and will d/c before 2 midnights.  Dispo: The patient is from: Home              Anticipated d/c is to: Home              Anticipated d/c date is: 2 days              Patient currently is not medically stable to d/c.        Code Status:     Code Status Orders  (From admission, onward)         Start     Ordered   02/04/20 1613  Full code  Continuous        02/04/20 1612        Code Status History    Date Active Date Inactive Code Status Order ID Comments User Context   12/11/2015 1023 12/12/2015 1235 Full Code [id number]  Jiles HaroldLaliberte, Danielle, PA-C Inpatient   05/23/2014 1243 05/25/2014 1654 Full Code [id number]  Jiles HaroldLaliberte, Danielle, PA-C Inpatient   05/21/2011 0556 05/22/2011 1634 Full Code [id number]  Sandrea HughsMadsen, Daniel, RN ED   Advance Care Planning Activity        IV Access:    Peripheral IV   Procedures  and diagnostic studies:   DG Chest 2 View  Result Date: 02/04/2020 CLINICAL DATA:  Shortness of breath EXAM: CHEST - 2 VIEW COMPARISON:  04/11/2019 FINDINGS: Chronic interstitial prominence. No new consolidation or edema. No pleural effusion or pneumothorax. Stable cardiomediastinal contours with mild cardiomegaly. No acute osseous abnormality. Bilateral reverse shoulder arthroplasties. IMPRESSION: No acute process in the chest. Chronic interstitial changes and stable cardiomegaly. Electronically Signed   By: Guadlupe Spanish M.D.   On: 02/04/2020 11:59     Medical Consultants:    None.  Anti-Infectives:   none  Subjective:    Jenna Murphy she relates her dyspnea on exertion is now better this morning.  Objective:    Vitals:   02/04/20 2148 02/05/20 0020 02/05/20 0144 02/05/20 0428  BP: (!) 157/72 (!)  148/76  139/74  Pulse:  62  65  Resp: 20 20  20   Temp: 97.8 F (36.6 C) 97.8 F (36.6 C)  97.7 F (36.5 C)  TempSrc: Oral Oral  Oral  SpO2: 94% 100%  97%  Weight: 72.4 kg  71.7 kg   Height: 5\' 4"  (1.626 m)      SpO2: 97 %   Intake/Output Summary (Last 24 hours) at 02/05/2020 0749 Last data filed at 02/05/2020 0600 Gross per 24 hour  Intake 120 ml  Output 850 ml  Net -730 ml   Filed Weights   02/04/20 1229 02/04/20 2148 02/05/20 0144  Weight: 73.5 kg 72.4 kg 71.7 kg    Exam: General exam: In no acute distress. Respiratory system: Good air movement with diffuse crackles at bases bilaterally. Cardiovascular system: S1 & S2 heard, RRR.  Negative JVD Gastrointestinal system: Abdomen is nondistended, soft and nontender.  Central nervous system: Alert and oriented. No focal neurological deficits.  She is mentally sharp Extremities: No pedal edema. Skin: No rashes, lesions or ulcers Psychiatry: Judgement and insight appear normal. Mood & affect appropriate.    Data Reviewed:    Labs: Basic Metabolic Panel: Recent Labs  Lab 02/04/20 1143 02/05/20 0313  NA 141 141  K 4.3 3.7  CL 106 104  CO2 23 25  GLUCOSE 157* 65*  BUN 26* 25*  CREATININE 1.82* 1.61*  CALCIUM 9.8 9.4   GFR Estimated Creatinine Clearance: 22.5 mL/min (A) (by C-G formula based on SCr of 1.61 mg/dL (H)). Liver Function Tests: Recent Labs  Lab 02/05/20 0313  AST 19  ALT 14  ALKPHOS 83  BILITOT 1.0  PROT 7.1  ALBUMIN 3.5   No results for input(s): LIPASE, AMYLASE in the last 168 hours. No results for input(s): AMMONIA in the last 168 hours. Coagulation profile No results for input(s): INR, PROTIME in the last 168 hours. COVID-19 Labs  Recent Labs    02/04/20 2015 02/05/20 0313  DDIMER 1.07* 1.17*    Lab Results  Component Value Date   SARSCOV2NAA NEGATIVE 02/04/2020   SARSCOV2NAA DETECTED (A) 04/11/2019    CBC: Recent Labs  Lab 02/04/20 1143 02/05/20 0313  WBC 7.9 7.9   HGB 11.3* 11.8*  HCT 36.9 37.5  MCV 81.8 80.3  PLT 299 301   Cardiac Enzymes: No results for input(s): CKTOTAL, CKMB, CKMBINDEX, TROPONINI in the last 168 hours. BNP (last 3 results) No results for input(s): PROBNP in the last 8760 hours. CBG: Recent Labs  Lab 02/04/20 1619 02/04/20 1714 02/04/20 2010 02/04/20 2224 02/05/20 0617  GLUCAP 36* 104* 206* 110* 74   D-Dimer: Recent Labs    02/04/20 2015 02/05/20 0313  DDIMER 1.07*  1.17*   Hgb A1c: No results for input(s): HGBA1C in the last 72 hours. Lipid Profile: No results for input(s): CHOL, HDL, LDLCALC, TRIG, CHOLHDL, LDLDIRECT in the last 72 hours. Thyroid function studies: No results for input(s): TSH, T4TOTAL, T3FREE, THYROIDAB in the last 72 hours.  Invalid input(s): FREET3 Anemia work up: No results for input(s): VITAMINB12, FOLATE, FERRITIN, TIBC, IRON, RETICCTPCT in the last 72 hours. Sepsis Labs: Recent Labs  Lab 02/04/20 1143 02/05/20 0313  WBC 7.9 7.9   Microbiology Recent Results (from the past 240 hour(s))  Respiratory Panel by RT PCR (Flu A&B, Covid) - Nasopharyngeal Swab     Status: None   Collection Time: 02/04/20 12:58 PM   Specimen: Nasopharyngeal Swab  Result Value Ref Range Status   SARS Coronavirus 2 by RT PCR NEGATIVE NEGATIVE Final    Comment: (NOTE) SARS-CoV-2 target nucleic acids are NOT DETECTED.  The SARS-CoV-2 RNA is generally detectable in upper respiratoy specimens during the acute phase of infection. The lowest concentration of SARS-CoV-2 viral copies this assay can detect is 131 copies/mL. A negative result does not preclude SARS-Cov-2 infection and should not be used as the sole basis for treatment or other patient management decisions. A negative result may occur with  improper specimen collection/handling, submission of specimen other than nasopharyngeal swab, presence of viral mutation(s) within the areas targeted by this assay, and inadequate number of viral  copies (<131 copies/mL). A negative result must be combined with clinical observations, patient history, and epidemiological information. The expected result is Negative.  Fact Sheet for Patients:  https://www.moore.com/  Fact Sheet for Healthcare Providers:  https://www.young.biz/  This test is no t yet approved or cleared by the [place] FDA and  has been authorized for detection and/or diagnosis of SARS-CoV-2 by FDA under an Emergency Use Authorization (EUA). This EUA will remain  in effect (meaning this test can be used) for the duration of the COVID-19 declaration under Section 564(b)(1) of the Act, 21 U.S.C. section 360bbb-3(b)(1), unless the authorization is terminated or revoked sooner.     Influenza A by PCR NEGATIVE NEGATIVE Final   Influenza B by PCR NEGATIVE NEGATIVE Final    Comment: (NOTE) The Xpert Xpress SARS-CoV-2/FLU/RSV assay is intended as an aid in  the diagnosis of influenza from Nasopharyngeal swab specimens and  should not be used as a sole basis for treatment. Nasal washings and  aspirates are unacceptable for Xpert Xpress SARS-CoV-2/FLU/RSV  testing.  Fact Sheet for Patients: https://www.moore.com/  Fact Sheet for Healthcare Providers: https://www.young.biz/  This test is not yet approved or cleared by the [place] FDA and  has been authorized for detection and/or diagnosis of SARS-CoV-2 by  FDA under an Emergency Use Authorization (EUA). This EUA will remain  in effect (meaning this test can be used) for the duration of the  Covid-19 declaration under Section 564(b)(1) of the Act, 21  U.S.C. section 360bbb-3(b)(1), unless the authorization is  terminated or revoked. Performed at [facility], [address] [place], [place] [address]      Medications:   . atorvastatin  20 mg Oral Daily  . heparin  5,000 Units Subcutaneous Q8H  . [START ON  02/06/2020] influenza vaccine adjuvanted  0.5 mL Intramuscular Tomorrow-1000  . insulin aspart  0-9 Units Subcutaneous TID WC  . metoprolol tartrate  75 mg Oral BID  . [START ON 02/06/2020] pneumococcal 23 valent vaccine  0.5 mL Intramuscular Tomorrow-1000   Continuous Infusions:    LOS: 0  days   Marinda Elk  [facility]  02/05/2020, 7:49 AM

## 2020-02-06 ENCOUNTER — Observation Stay (HOSPITAL_COMMUNITY): Payer: Medicare Other

## 2020-02-06 DIAGNOSIS — E1169 Type 2 diabetes mellitus with other specified complication: Secondary | ICD-10-CM

## 2020-02-06 DIAGNOSIS — Z96612 Presence of left artificial shoulder joint: Secondary | ICD-10-CM | POA: Diagnosis present

## 2020-02-06 DIAGNOSIS — Z87891 Personal history of nicotine dependence: Secondary | ICD-10-CM | POA: Diagnosis not present

## 2020-02-06 DIAGNOSIS — I130 Hypertensive heart and chronic kidney disease with heart failure and stage 1 through stage 4 chronic kidney disease, or unspecified chronic kidney disease: Secondary | ICD-10-CM | POA: Diagnosis present

## 2020-02-06 DIAGNOSIS — Z96611 Presence of right artificial shoulder joint: Secondary | ICD-10-CM | POA: Diagnosis present

## 2020-02-06 DIAGNOSIS — J9811 Atelectasis: Secondary | ICD-10-CM | POA: Diagnosis present

## 2020-02-06 DIAGNOSIS — E1122 Type 2 diabetes mellitus with diabetic chronic kidney disease: Secondary | ICD-10-CM

## 2020-02-06 DIAGNOSIS — R0600 Dyspnea, unspecified: Secondary | ICD-10-CM | POA: Diagnosis present

## 2020-02-06 DIAGNOSIS — Z8249 Family history of ischemic heart disease and other diseases of the circulatory system: Secondary | ICD-10-CM | POA: Diagnosis not present

## 2020-02-06 DIAGNOSIS — Z794 Long term (current) use of insulin: Secondary | ICD-10-CM | POA: Diagnosis not present

## 2020-02-06 DIAGNOSIS — E785 Hyperlipidemia, unspecified: Secondary | ICD-10-CM

## 2020-02-06 DIAGNOSIS — I509 Heart failure, unspecified: Secondary | ICD-10-CM | POA: Diagnosis present

## 2020-02-06 DIAGNOSIS — Z7984 Long term (current) use of oral hypoglycemic drugs: Secondary | ICD-10-CM | POA: Diagnosis not present

## 2020-02-06 DIAGNOSIS — Z23 Encounter for immunization: Secondary | ICD-10-CM | POA: Diagnosis present

## 2020-02-06 DIAGNOSIS — N179 Acute kidney failure, unspecified: Secondary | ICD-10-CM | POA: Diagnosis not present

## 2020-02-06 DIAGNOSIS — I272 Pulmonary hypertension, unspecified: Secondary | ICD-10-CM | POA: Diagnosis present

## 2020-02-06 DIAGNOSIS — I5033 Acute on chronic diastolic (congestive) heart failure: Secondary | ICD-10-CM | POA: Diagnosis present

## 2020-02-06 DIAGNOSIS — R0609 Other forms of dyspnea: Secondary | ICD-10-CM | POA: Diagnosis present

## 2020-02-06 DIAGNOSIS — N1831 Chronic kidney disease, stage 3a: Secondary | ICD-10-CM

## 2020-02-06 DIAGNOSIS — Z20822 Contact with and (suspected) exposure to covid-19: Secondary | ICD-10-CM | POA: Diagnosis present

## 2020-02-06 DIAGNOSIS — J9600 Acute respiratory failure, unspecified whether with hypoxia or hypercapnia: Secondary | ICD-10-CM | POA: Diagnosis present

## 2020-02-06 DIAGNOSIS — E11649 Type 2 diabetes mellitus with hypoglycemia without coma: Secondary | ICD-10-CM | POA: Diagnosis present

## 2020-02-06 DIAGNOSIS — I5031 Acute diastolic (congestive) heart failure: Secondary | ICD-10-CM

## 2020-02-06 LAB — URINE CULTURE

## 2020-02-06 LAB — GLUCOSE, CAPILLARY
Glucose-Capillary: 93 mg/dL (ref 70–99)
Glucose-Capillary: 110 mg/dL — ABNORMAL HIGH (ref 70–99)
Glucose-Capillary: 144 mg/dL — ABNORMAL HIGH (ref 70–99)

## 2020-02-06 LAB — BASIC METABOLIC PANEL
Anion gap: 8 (ref 5–15)
BUN: 23 mg/dL (ref 8–23)
CO2: 25 mmol/L (ref 22–32)
Calcium: 9.1 mg/dL (ref 8.9–10.3)
Chloride: 109 mmol/L (ref 98–111)
Creatinine, Ser: 1.5 mg/dL — ABNORMAL HIGH (ref 0.44–1.00)
GFR, Estimated: 33 mL/min — ABNORMAL LOW (ref >60)
Glucose, Bld: 70 mg/dL (ref 70–99)
Potassium: 4.1 mmol/L (ref 3.5–5.1)
Sodium: 142 mmol/L (ref 135–145)

## 2020-02-06 MED ORDER — IOHEXOL 350 MG/ML SOLN
100.0000 mL | Freq: Once | INTRAVENOUS | Status: AC | PRN
  Administered 2020-02-06: 80 mL via INTRAVENOUS

## 2020-02-06 NOTE — Progress Notes (Signed)
Pt is tearful with insertion attempt for a IV in left AC. Placing a stat order for IV team

## 2020-02-06 NOTE — Progress Notes (Signed)
  Mobility Specialist Criteria Algorithm Info.   Mobility Team: HOB elevated: Activity: Ambulated in hall (In chair before and after ambulation) Range of motion: Active;All extremities Level of assistance: Moderate assist, patient does 50-74% Assistive device: Standard walker Minutes sitting in chair:  Minutes stood: 5 minutes Minutes ambulated: 5 minutes Distance ambulated (ft): 500 ft Mobility response: Tolerated well (Standing rest break x1) Bed Position: Chair (Recliner)  Received pt in chair willing and eager to participate in mobility this morning. Per report from pt, she lives at home alone but has multiple family members living close by and is always in and out checking on her. She's independent w ADL's and uses a cane at home, also has a RW but uses that PRN. Pt was able to stand w/RW with standby no hands-on assist, and ambulate in hall with supervision for 554ft. Required cues for hand placement initially and frequent cues to stand closer to walker. Also completed education on energy conservation and pursed lip breathing when feeling SOB. Pt tolerated ambulation well overall with no new complaints. .   02/06/2020 2:31 PM

## 2020-02-06 NOTE — Progress Notes (Signed)
PROGRESS NOTE    Jenna Murphy  NUU:[id number] DOB: 1928/01/26 DOA: 02/04/2020 PCP: Andi Devon, MD   Brief Narrative:  HPI On 02/04/2020 by Dr. Pamella Pert Jenna Murphy is a 86 y.o. female with medical history significant of type 2 diabetes mellitus, hypertension, hyperlipidemia, CKD 3A, chronic diastolic CHF comes into the hospital with complaints of shortness of breath.  Patient tells me that this has been somewhat chronic but has progressed in the last 3 weeks, family has noticed that when she walks she gets pretty short of breath, is breathing fast, and her family insisted that she comes to the ED today to get this checked out.  She denies any chest pain, denies any palpitations.  She denies any fever or chills.  She denies any cough or chest congestion.  She reports compliance with her home medications, denies any weight gain or weight loss.  She denies any swelling in her legs but has not paid attention.  Interim history Patient admitted with dyspnea on exertion.  Thus far VQ scan showed low probability of PE.  Lower extremity Doppler was negative for lower extremity DVTs.  CTA chest negative for PE however did show CHF.  Echocardiogram was obtained showing a diastolic heart failure. Assessment & Plan   Dyspnea on exertion suspect secondary to acute diastolic heart failure exacerbation -Patient has had a history of chronic dyspnea as noted by outpatient cardiology notes however over the last 3 weeks prior to admission, she was having worsening shortness of breath with activity. -On admission, patient did appear to be slightly volume overloaded with lower extremity edema and bibasilar crackles on examination -Patient was given 1 dose of IV Lasix on admission -VQ scan showed low probability for PE -Lower extremity Doppler negative for DVT -Echocardiogram showed EF 60-65%, G1DD. Moderately elevated pulm artery systolic pressure at . Pulmonary hypertension (new finding)   -Cardiology consulted and appreciated  -Patient continues to have noticeable dyspnea with exertion with minimal movement. -CTA chest obtained today which was negative for PE or thoracic aortic aneurysm.  Patient with underlying emphysematous changes with scattered areas of atelectasis.  Mild bibasilar interstitial edema coupled with cardiomegaly may indicate a degree of CHF.  Several small nodular appearing opacities, largest 7 x 4 mm.  Recommend follow-up in 6 to 12 months.  Acute kidney injury on chronic kidney disease, stage IIIa -Baseline creatinine approximately 1.2, however on admission was 1.8 (of note patient was on Metformin along with losartan and spironolactone prior to admission) -Patient was given 1 dose of IV Lasix due to CHF-Metformin was held -UA unremarkable for infection -Creatinine down to 1.5 today -Continue to monitor BMP  Diabetes mellitus, type II -Continue insulin sliding scale with CBG monitoring -Long-acting insulin was held due to hypoglycemia  Essential hypertension -Continue metoprolol  Hyperlipidemia -Continue statin   DVT Prophylaxis Heparin  Code Status: Full  Family Communication: None at bedside.  Daughter via phone  Disposition Plan:  Status is: Inpatient  Remains inpatient appropriate because:Ongoing diagnostic testing needed not appropriate for outpatient work up   Dispo: The patient is from: Home              Anticipated d/c is to: Home              Anticipated d/c date is: 2 days              Patient currently is not medically stable to d/c.   Consultants Cardiology  Procedures  Echocardiogram VQ scan Lower  extremity Doppler  Antibiotics   Anti-infectives (From admission, onward)   None      Subjective:   Jenna Murphy seen and examined today.  Feels her breathing has improved however has not been out of bed.  Denies current chest pain or shortness of breath, abdominal pain, nausea or vomiting, diarrhea or constipation,  dizziness or headache.  Patient was noted to be visibly short of breath upon returning from the bathroom to the bed.    Objective:   Vitals:   02/05/20 1228 02/05/20 1715 02/05/20 1959 02/06/20 0325  BP: 118/72 122/81 120/66 (!) 157/86  Pulse: (!) 58  (!) 54 68  Resp: Temp: 98.4 F (36.9 C) 98.5 F (36.9 C) 97.9 F (36.6 C) (!) 97.5 F (36.4 C)  TempSrc: Oral Oral Oral Oral  SpO2:   95% 94%  Weight:    72.7 kg  Height:        Intake/Output Summary (Last 24 hours) at 02/06/2020 1507 Last data filed at 02/06/2020 1344 Gross per 24 hour  Intake 600 ml  Output 950 ml  Net -350 ml   Filed Weights   02/04/20 2148 02/05/20 0144 02/06/20 0325  Weight: 72.4 kg 71.7 kg 72.7 kg    Exam  General: Well developed, well nourished, NAD, appears stated age  HEENT: NCAT, mucous membranes moist.   Cardiovascular: S1 S2 auscultated, RRR  Respiratory: Clear to auscultation bilaterally  Abdomen: Soft, nontender, nondistended, + bowel sounds  Extremities: warm dry without cyanosis clubbing or edema  Neuro: AAOx3, nonfocal  Psych: appropriate mood and affect   Data Reviewed: I have personally reviewed following labs and imaging studies  CBC: Recent Labs  Lab 02/04/20 1143 02/05/20 0313  WBC 7.9 7.9  HGB 11.3* 11.8*  HCT 36.9 37.5  MCV 81.8 80.3  PLT 299 301   Basic Metabolic Panel: Recent Labs  Lab 02/04/20 1143 02/05/20 0313 02/06/20 0219  NA 141 141 142  K 4.3 3.7 4.1  CL 106 104 109  CO2 GLUCOSE 157* 65* 70  BUN 26* 25* 23  CREATININE 1.82* 1.61* 1.50*  CALCIUM 9.8 9.4 9.1   GFR: Estimated Creatinine Clearance: 24.4 mL/min (A) (by C-G formula based on SCr of 1.5 mg/dL (H)). Liver Function Tests: Recent Labs  Lab 02/05/20 0313  AST 19  ALT 14  ALKPHOS 83  BILITOT 1.0  PROT 7.1  ALBUMIN 3.5   No results for input(s): LIPASE, AMYLASE in the last 168 hours. No results for input(s): AMMONIA in the last 168 hours. Coagulation  Profile: No results for input(s): INR, PROTIME in the last 168 hours. Cardiac Enzymes: No results for input(s): CKTOTAL, CKMB, CKMBINDEX, TROPONINI in the last 168 hours. BNP (last 3 results) No results for input(s): PROBNP in the last 8760 hours. HbA1C: No results for input(s): HGBA1C in the last 72 hours. CBG: Recent Labs  Lab 02/05/20 1226 02/05/20 1717 02/05/20 2114 02/06/20 0622 02/06/20 1109  GLUCAP 114* 133* 126* 93 110*   Lipid Profile: No results for input(s): CHOL, HDL, LDLCALC, TRIG, CHOLHDL, LDLDIRECT in the last 72 hours. Thyroid Function Tests: No results for input(s): TSH, T4TOTAL, FREET4, T3FREE, THYROIDAB in the last 72 hours. Anemia Panel: No results for input(s): VITAMINB12, FOLATE, FERRITIN, TIBC, IRON, RETICCTPCT in the last 72 hours. Urine analysis:    Component Value Date/Time   COLORURINE STRAW (A) 02/04/2020 2005   APPEARANCEUR CLEAR 02/04/2020 2005   LABSPEC 1.008 02/04/2020 2005   PHURINE  5.0 02/04/2020 2005   GLUCOSEU NEGATIVE 02/04/2020 2005   HGBUR NEGATIVE 02/04/2020 2005   BILIRUBINUR NEGATIVE 02/04/2020 2005   KETONESUR NEGATIVE 02/04/2020 2005   PROTEINUR NEGATIVE 02/04/2020 2005   UROBILINOGEN 0.2 05/15/2014 0858   NITRITE NEGATIVE 02/04/2020 2005   LEUKOCYTESUR TRACE (A) 02/04/2020 2005   Sepsis Labs: (procalcitonin:4,lacticidven:4)  ) Recent Results (from the past 240 hour(s))  Respiratory Panel by RT PCR (Flu A&B, Covid) - Nasopharyngeal Swab     Status: None   Collection Time: 02/04/20 12:58 PM   Specimen: Nasopharyngeal Swab  Result Value Ref Range Status   SARS Coronavirus 2 by RT PCR NEGATIVE NEGATIVE Final    Comment: (NOTE) SARS-CoV-2 target nucleic acids are NOT DETECTED.  The SARS-CoV-2 RNA is generally detectable in upper respiratoy specimens during the acute phase of infection. The lowest concentration of SARS-CoV-2 viral copies this assay can detect is 131 copies/mL. A negative result does not preclude  SARS-Cov-2 infection and should not be used as the sole basis for treatment or other patient management decisions. A negative result may occur with  improper specimen collection/handling, submission of specimen other than nasopharyngeal swab, presence of viral mutation(s) within the areas targeted by this assay, and inadequate number of viral copies (<131 copies/mL). A negative result must be combined with clinical observations, patient history, and epidemiological information. The expected result is Negative.  Fact Sheet for Patients:  https://www.moore.com/  Fact Sheet for Healthcare Providers:  https://www.young.biz/  This test is no t yet approved or cleared by the [place] FDA and  has been authorized for detection and/or diagnosis of SARS-CoV-2 by FDA under an Emergency Use Authorization (EUA). This EUA will remain  in effect (meaning this test can be used) for the duration of the COVID-19 declaration under Section 564(b)(1) of the Act, 21 U.S.C. section 360bbb-3(b)(1), unless the authorization is terminated or revoked sooner.     Influenza A by PCR NEGATIVE NEGATIVE Final   Influenza B by PCR NEGATIVE NEGATIVE Final    Comment: (NOTE) The Xpert Xpress SARS-CoV-2/FLU/RSV assay is intended as an aid in  the diagnosis of influenza from Nasopharyngeal swab specimens and  should not be used as a sole basis for treatment. Nasal washings and  aspirates are unacceptable for Xpert Xpress SARS-CoV-2/FLU/RSV  testing.  Fact Sheet for Patients: https://www.moore.com/  Fact Sheet for Healthcare Providers: https://www.young.biz/  This test is not yet approved or cleared by the [place] FDA and  has been authorized for detection and/or diagnosis of SARS-CoV-2 by  FDA under an Emergency Use Authorization (EUA). This EUA will remain  in effect (meaning this test can be used) for the duration of the   Covid-19 declaration under Section 564(b)(1) of the Act, 21  U.S.C. section 360bbb-3(b)(1), unless the authorization is  terminated or revoked. Performed at [facility], [address], [place], [place] [address]   Urine culture     Status: Abnormal   Collection Time: 02/04/20  8:12 PM   Specimen: Urine, Random  Result Value Ref Range Status   Specimen Description URINE, RANDOM  Final   Special Requests   Final    NONE Performed at [facility], [address], [place], [place] [address]    Culture MULTIPLE SPECIES PRESENT, SUGGEST RECOLLECTION (A)  Final   Report Status 02/06/2020 FINAL  Final      Radiology Studies: CT ANGIO CHEST PE W OR WO CONTRAST  Result Date: 02/06/2020 CLINICAL DATA:  Positive D-dimer with  shortness of breath EXAM: CT ANGIOGRAPHY CHEST WITH CONTRAST TECHNIQUE: Multidetector CT imaging of the chest was performed using the standard protocol during bolus administration of intravenous contrast. Multiplanar CT image reconstructions and MIPs were obtained to evaluate the vascular anatomy. CONTRAST:  26mL OMNIPAQUE IOHEXOL 350 MG/ML SOLN COMPARISON:  Chest radiograph February 04, 2020; nuclear medicine perfusion lung scan February 05, 2020 FINDINGS: Cardiovascular: There is no demonstrable pulmonary embolus. No thoracic aortic aneurysm. No dissection evident. Note that contrast bolus in the aorta is less than optimal for potential dissection assessment. There are scattered foci of great vessel calcification. There are foci of aortic atherosclerosis as well as multiple foci of coronary artery calcification. There is no pericardial effusion or pericardial thickening. Heart is mildly enlarged. Mediastinum/Nodes: Thyroid appears unremarkable. There are scattered subcentimeter mediastinal lymph nodes. No adenopathy by size criteria evident on this study. There are no appreciable esophageal lesions. Lungs/Pleura: There are areas of scattered bullous disease in the  lung bases, more on the right than on the left. There is mild interstitial edema in the lung bases with areas of atelectasis. Ill-defined airspace opacity in the left upper lobe is felt to represent a combination of atelectasis and superimposed focal pneumonia. On axial slice 47 series 6, there is a 3 mm nodular opacity in the anterior segment of the right upper lobe. On axial slice 54 series 6, there is a 7 x 4 mm nodular opacity in the anterior segment of the left upper lobe. On axial slice 66 series 6, there is a 3 mm nodular opacity in the anterior segment of the left upper lobe. No appreciable pleural effusions. Upper Abdomen: There is reflux of contrast into the inferior vena cava and hepatic veins. Gallbladder is absent. There are foci of aortic and proximal superior mesenteric artery calcification. Visualized upper abdominal structures otherwise normal. Musculoskeletal: Total shoulder replacements noted bilaterally. Degenerative change noted in the thoracic spine. No blastic or lytic bone lesions are evident. No chest wall lesions. Review of the MIP images confirms the above findings. IMPRESSION: 1. No demonstrable pulmonary embolus. No thoracic aortic aneurysm. No dissection evident. Note that the contrast bolus in the aorta is less than optimal for potential dissection assessment. There is aortic atherosclerosis as well as foci of great vessel and coronary artery calcification. Mild cardiomegaly. 2. Underlying emphysematous change with scattered areas of atelectasis. Mild bibasilar interstitial edema coupled with cardiomegaly may indicate a degree of congestive heart failure. No pleural effusions. Area of patchy airspace opacity in the left upper lobe anteriorly may represent a small focus of pneumonia, with atelectasis. 3. Several small nodular appearing opacities. Largest nodular opacity measures 7 x 4 mm in the anterior segment left upper lobe. Non-contrast chest CT at 6-12 months is recommended. If the  nodule is stable at time of repeat CT, then future CT at 18-24 months (from today's scan) is considered optional for low-risk patients, but is recommended for high-risk patients. This recommendation follows the consensus statement: Guidelines for Management of Incidental Pulmonary Nodules Detected on CT Images: From the Fleischner Society 2017; Radiology 2017; 284:228-243. 4.  No evident adenopathy. 5. Reflux of contrast into the inferior vena cava and hepatic veins may be indicative of a degree of increase in right heart pressure. 6.  Gallbladder absent.  Total shoulder replacements bilaterally. Aortic Atherosclerosis (ICD10-I70.0) and Emphysema (ICD10-J43.9). Electronically Signed   By: Bretta Bang III M.D.   On: 02/06/2020 13:53   NM Pulmonary Perfusion  Result Date: 02/05/2020 CLINICAL DATA:  PE suspected EXAM: NUCLEAR MEDICINE PERFUSION LUNG SCAN TECHNIQUE: Perfusion images were obtained in multiple projections after intravenous injection of radiopharmaceutical. Ventilation scans intentionally deferred if perfusion scan and chest x-ray adequate for interpretation during COVID 19 epidemic. RADIOPHARMACEUTICALS:  4.2 mCi Tc-59m MAA IV COMPARISON:  Chest radiograph, 02/04/2020 FINDINGS: Normal, homogeneous perfusion of the lungs. No suspicious filling defects. IMPRESSION: Very low probability examination for pulmonary embolism by modified perfusion only PIOPED criteria (PE absent). Electronically Signed   By: Lauralyn Primes M.D.   On: 02/05/2020 09:59   ECHOCARDIOGRAM COMPLETE  Result Date: 02/05/2020    ECHOCARDIOGRAM REPORT   Patient Name:   Jenna Murphy Date of Exam: 02/05/2020 Medical Rec #:  [id number]       Height:       64.0 in Accession #:    [id number]      Weight:       158.0 lb Date of Birth:  02-08-1928       BSA:          1.770 m Patient Age:    89 years        BP:           124/59 mmHg Patient Gender: F               HR:           62 bpm. Exam Location:  Inpatient Procedure: 2D Echo,  Color Doppler and Cardiac Doppler Indications:    CHF-Acute Diastolic 428.31 / I50.31  History:        Patient has prior history of Echocardiogram examinations, most                 recent 06/06/2019. CHF, Signs/Symptoms:Shortness of Breath; Risk                 Factors:Hypertension, Dyslipidemia and Diabetes.  Sonographer:    Eulah Pont RDCS Referring Phys: Lang Snow Daylene Katayama [facility] IMPRESSIONS  1. Left ventricular ejection fraction, by estimation, is 60 to 65%. The left ventricle has normal function. The left ventricle has no regional wall motion abnormalities. There is moderate left ventricular hypertrophy. Left ventricular diastolic parameters are consistent with Grade I diastolic dysfunction (impaired relaxation).  2. Right ventricular systolic function is normal. The right ventricular size is normal. There is moderately elevated pulmonary artery systolic pressure at 48 mmHg.  3. Left atrial size was mildly dilated.  4. Tricuspid valve regurgitation is moderate.  5. Compared to previous outpatient study on 06/05/2019, pulmonary hypertension is new finding. FINDINGS  Left Ventricle: Left ventricular ejection fraction, by estimation, is 60 to 65%. The left ventricle has normal function. The left ventricle has no regional wall motion abnormalities. The left ventricular internal cavity size was normal in size. There is  moderate left ventricular hypertrophy. Left ventricular diastolic parameters are consistent with Grade I diastolic dysfunction (impaired relaxation). Right Ventricle: The right ventricular size is normal. No increase in right ventricular wall thickness. Right ventricular systolic function is normal. There is moderately elevated pulmonary artery systolic pressure. The tricuspid regurgitant velocity is 3.34 m/s, and with an assumed right atrial pressure of 3 mmHg, the estimated right ventricular systolic pressure is 47.6 mmHg. Left Atrium: Left atrial size was mildly dilated. Right Atrium: Right atrial  size was normal in size. Pericardium: There is no evidence of pericardial effusion. Mitral Valve: The mitral valve is grossly normal. No evidence of mitral valve regurgitation. Tricuspid Valve: The tricuspid valve is grossly normal. Tricuspid valve regurgitation is  moderate. Aortic Valve: The aortic valve is grossly normal. Aortic valve regurgitation is not visualized. Pulmonic Valve: The pulmonic valve was grossly normal. Pulmonic valve regurgitation is not visualized. Aorta: Upper limit normal at 3.5 cm. Venous: The inferior vena cava is normal in size with greater than 50% respiratory variability, suggesting right atrial pressure of 3 mmHg. IAS/Shunts: The interatrial septum is aneurysmal. No atrial level shunt detected by color flow Doppler.  LEFT VENTRICLE PLAX 2D LVIDd:         4.00 cm  Diastology LVIDs:         2.60 cm  LV e' medial:    3.24 cm/s LV PW:         1.40 cm  LV E/e' medial:  17.7 LV IVS:        1.00 cm  LV e' lateral:   3.07 cm/s LVOT diam:     2.00 cm  LV E/e' lateral: 18.7 LV SV:         85 LV SV Index:   48 LVOT Area:     3.14 cm  RIGHT VENTRICLE RV S prime:     11.30 cm/s TAPSE (M-mode): 1.8 cm LEFT ATRIUM             Index       RIGHT ATRIUM           Index LA diam:        3.00 cm 1.70 cm/m  RA Area:     10.60 cm LA Vol (A2C):   48.0 ml 27.12 ml/m RA Volume:   16.10 ml  9.10 ml/m LA Vol (A4C):   62.5 ml 35.31 ml/m LA Biplane Vol: 58.4 ml 33.00 ml/m  AORTIC VALVE LVOT Vmax:   117.00 cm/s LVOT Vmean:  86.300 cm/s LVOT VTI:    0.270 m  AORTA Ao Root diam: 3.10 cm Ao Asc diam:  3.50 cm MITRAL VALVE                TRICUSPID VALVE MV Area (PHT): 1.92 cm     TR Peak grad:   44.6 mmHg MV Decel Time: 396 msec     TR Vmax:        334.00 cm/s MV E velocity: 57.40 cm/s MV A velocity: 105.00 cm/s  SHUNTS MV E/A ratio:  0.55         Systemic VTI:  0.27 m                             Systemic Diam: 2.00 cm Truett MainlandManish Patwardhan MD Electronically signed by Truett MainlandManish Patwardhan MD Signature Date/Time:  02/05/2020/2:24:48 PM    Final    VAS US LOWER EXTREMITY VENOUS (DVT)  Result Date: 02/05/2020  Lower Venous DVTStudy Indications: Asymmetric edema.  Anticoagulation: Lovenox. Comparison Study: No prior studies. Performing Technologist: Mirella Rosenthalachel Hodge  Examination Guidelines: A complete evaluation includes B-mode imaging, spectral Doppler, color Doppler, and power Doppler as needed of all accessible portions of each vessel. Bilateral testing is considered an integral part of a complete examination. Limited examinations for reoccurring indications may be performed as noted. The reflux portion of the exam is performed with the patient in reverse Trendelenburg.  +---------+---------------+---------+-----------+----------+--------------+ RIGHT    CompressibilityPhasicitySpontaneityPropertiesThrombus Aging +---------+---------------+---------+-----------+----------+--------------+ CFV      Full           Yes      Yes                                 +---------+---------------+---------+-----------+----------+--------------+  SFJ      Full                                                        +---------+---------------+---------+-----------+----------+--------------+ FV Prox  Full                                                        +---------+---------------+---------+-----------+----------+--------------+ FV Mid   Full                                                        +---------+---------------+---------+-----------+----------+--------------+ FV DistalFull                                                        +---------+---------------+---------+-----------+----------+--------------+ PFV      Full                                                        +---------+---------------+---------+-----------+----------+--------------+ POP      Full           Yes      Yes                                  +---------+---------------+---------+-----------+----------+--------------+ PTV      Full                                                        +---------+---------------+---------+-----------+----------+--------------+ PERO     Full                                                        +---------+---------------+---------+-----------+----------+--------------+   +---------+---------------+---------+-----------+----------+--------------+ LEFT     CompressibilityPhasicitySpontaneityPropertiesThrombus Aging +---------+---------------+---------+-----------+----------+--------------+ CFV      Full           Yes      Yes                                 +---------+---------------+---------+-----------+----------+--------------+ SFJ      Full                                                        +---------+---------------+---------+-----------+----------+--------------+  FV Prox  Full                                                        +---------+---------------+---------+-----------+----------+--------------+ FV Mid   Full                                                        +---------+---------------+---------+-----------+----------+--------------+ FV DistalFull                                                        +---------+---------------+---------+-----------+----------+--------------+ PFV      Full                                                        +---------+---------------+---------+-----------+----------+--------------+ POP      Full           Yes      Yes                                 +---------+---------------+---------+-----------+----------+--------------+ PTV      Full                                                        +---------+---------------+---------+-----------+----------+--------------+ PERO     Full                                                         +---------+---------------+---------+-----------+----------+--------------+     Summary: RIGHT: - There is no evidence of deep vein thrombosis in the lower extremity.  - No cystic structure found in the popliteal fossa.  LEFT: - There is no evidence of deep vein thrombosis in the lower extremity.  - No cystic structure found in the popliteal fossa.  *See table(s) above for measurements and observations. Electronically signed by Waverly Ferrari MD on 02/05/2020 at 5:38:30 PM.    Final      Scheduled Meds: . atorvastatin  20 mg Oral Daily  . heparin  5,000 Units Subcutaneous Q8H  . insulin aspart  0-9 Units Subcutaneous TID WC  . metoprolol tartrate  75 mg Oral BID   Continuous Infusions: . sodium chloride 100 mL/hr at 02/06/20 0759     LOS: 0 days   Time Spent in minutes   45 minutes  Nnenna Meador D.O. on 02/06/2020 at 3:07 PM  Between 7am to 7pm - Please see pager noted on [email or web address]  After 7pm go to [email or web address]  And look for the night coverage person covering for me after hours  [facility] Office  [phone]

## 2020-02-06 NOTE — Consult Note (Signed)
CARDIOLOGY CONSULT NOTE  Patient ID: Jenna Murphy MRN: [id number] DOB/AGE: 1928/03/29 86 y.o.  Admit date: 02/04/2020 Attending physician: Edsel Petrin, DO Primary Physician:  Andi Devon, MD Outpatient Cardiologist: Dr. Truett Mainland Inpatient Cardiologist: Tessa Lerner, DO, [facility]  Chief complaint: Shortness of breath  HPI:  JANIQUA Murphy is a 86 y.o. African-American female who presents with a chief complaint of " shortness of breath." Her past medical history and cardiovascular risk factors include: Type 2 diabetes mellitus, hypertension, hyperlipidemia, chronic kidney disease stage III a, chronic diastolic heart failure, coronary artery calcification on nongated CT study, former smoker, emphysema, advanced age, postmenopausal female.  She is known to have chronic dyspnea however over the last 3 weeks the symptoms have been getting progressively worse.  Her family noticed that the symptoms were progressive and predominantly when she was walking she was more short of breath than her baseline.  At the recommendation of her family who presents to the hospital for further evaluation and management.  Patient was admitted on February 04, 2020 and cardiology is consulted today for further evaluation and management of dyspnea and heart failure with preserved EF.  On admission patient had acute kidney injury with serum creatinine of 1.82 mg/dL, elevated BNP 409, high sensitive troponin negative x1.   Given her history of recent travel and elevated D-dimers patient underwent lower extremity duplex which was negative for DVT.  She also underwent CTA chest for PE protocol which is negative for pulmonary embolism, thoracic aortic aneurysm, and dissection.  She also underwent a V/Q study which was low probability for pulmonary embolism. Given her history of heart failure with preserved EF patient underwent a repeat echocardiogram which notes preserved LVEF, grade 1 diastolic impairment.   She is also noted to have moderate tricuspid regurgitation with RVSP of 48 mmHg suggestive of mild pulmonary hypertension.  At the time of evaluation no family was present at bedside.  She is comfortably sitting in chair watching TV.  She denies any chest pain at rest or with effort related activities.  She was walking earlier in the room as well as the floor without any significant  hypoxia per nursing staff.  Clinically she is euvolemic and not in congestive heart failure.  She denies orthopnea, paroxysmal nocturnal dyspnea or lower extremity swelling.   ALLERGIES: No Active Allergies  PAST MEDICAL HISTORY: Past Medical History:  Diagnosis Date  . Arthritis   . Carpal tunnel syndrome of left wrist   . Diabetes mellitus    not on insulin  . Gallstone pancreatitis 02/2008  . Heart murmur   . Hypertension   . Polio    as child     PAST SURGICAL HISTORY: Past Surgical History:  Procedure Laterality Date  . CHOLECYSTECTOMY    . REVERSE SHOULDER ARTHROPLASTY Left 05/23/2014   Procedure: REVERSE SHOULDER ARTHROPLASTY;  Surgeon: Mable Paris, MD;  Location: [facility] OR;  Service: Orthopedics;  Laterality: Left;  . REVERSE SHOULDER ARTHROPLASTY Right 12/11/2015   Procedure: REVERSE SHOULDER ARTHROPLASTY;  Surgeon: Jones Broom, MD;  Location: [facility] OR;  Service: Orthopedics;  Laterality: Right;  Right reverse total shoulder arthroplasty  . STERIOD INJECTION Left 05/23/2014   Procedure: STEROID INJECTION THUMB;  Surgeon: Mable Paris, MD;  Location: [facility] OR;  Service: Orthopedics;  Laterality: Left;  Steroid injection left thumb    FAMILY HISTORY: The patient family history includes Cirrhosis in her father; Heart attack in her mother.   SOCIAL HISTORY:  The patient  reports that she  quit smoking about 51 years ago. Her smoking use included cigarettes. She has a 20.00 pack-year smoking history. She has never used smokeless tobacco. She reports that she does not drink alcohol  and does not use drugs.  MEDICATIONS: Current Outpatient Medications  Medication Instructions  . amLODipine (NORVASC) 5 MG tablet TAKE 1 TABLET BY MOUTH EVERY DAY  . atorvastatin (LIPITOR) 20 mg, Oral, Daily  . cholecalciferol (VITAMIN D3) 1,000 Units, Oral, Daily  . furosemide (LASIX) 40 MG tablet TAKE 1 TABLET(40 MG) BY MOUTH DAILY  . ibuprofen (ADVIL) 400 mg, Oral, Every 8 hours PRN  . metFORMIN (GLUCOPHAGE) 500 mg, Oral, 2 times daily with meals  . metoprolol tartrate (LOPRESSOR) 50 MG tablet TAKE 1 AND 1/2 TABLETS BY MOUTH TWICE DAILY  . OXYGEN Inhalation  . spironolactone (ALDACTONE) 25 MG tablet TAKE 1 TABLET BY MOUTH EVERY MORNING  . spironolactone (ALDACTONE) 50 MG tablet TAKE 1 TABLET(50 MG) BY MOUTH DAILY  . Toujeo SoloStar 30 Units, Subcutaneous, Daily    14 ORGAN REVIEW OF SYSTEMS: Review of Systems  Constitutional: Negative for chills and fever.  HENT: Negative for hoarse voice and nosebleeds.   Eyes: Negative for discharge, double vision and pain.  Cardiovascular: Positive for dyspnea on exertion (improving). Negative for chest pain, claudication, leg swelling, near-syncope, orthopnea, palpitations, paroxysmal nocturnal dyspnea and syncope.  Respiratory: Negative for hemoptysis and shortness of breath.   Musculoskeletal: Negative for muscle cramps and myalgias.  Gastrointestinal: Negative for abdominal pain, constipation, diarrhea, hematemesis, hematochezia, melena, nausea and vomiting.  Neurological: Negative for dizziness and light-headedness.  All other systems reviewed and are negative.  PHYSICAL EXAM: Vitals with BMI 02/06/2020 02/05/2020 02/05/2020  Height - - -  Weight 160 lbs 3 oz - -  BMI 27.48 - -  Systolic 157 120 161  Diastolic 86 66 81  Pulse 68 54 -    Intake/Output Summary (Last 24 hours) at 02/06/2020 1800 Last data filed at 02/06/2020 1344 Gross per 24 hour  Intake 600 ml  Output 800 ml  Net -200 ml    Net IO Since Admission: -371.11 mL  [02/06/20 1800]   CONSTITUTIONAL: Well-developed and well-nourished. No acute distress.  SKIN: Skin is warm and dry. No rash noted. No cyanosis. No pallor. No jaundice HEAD: Normocephalic and atraumatic.  EYES: No scleral icterus MOUTH/THROAT: Moist oral membranes.  NECK: No JVD present. No thyromegaly noted. No carotid bruits  LYMPHATIC: No visible cervical adenopathy.  CHEST Normal respiratory effort. No intercostal retractions  LUNGS: Clear to auscultation bilaterally.  No stridor. No wheezes. No rales.  CARDIOVASCULAR: Regular, positive S1-S2, soft holosystolic murmur heard left sternal border, no gallops or rubs. ABDOMINAL: Soft, nontender, nondistended, positive bowel sounds in all 4 quadrants.  No apparent ascites.  EXTREMITIES: No peripheral edema  HEMATOLOGIC: No significant bruising NEUROLOGIC: Oriented to person, place, and time. Nonfocal. Normal muscle tone.  PSYCHIATRIC: Normal mood and affect. Normal behavior. Cooperative  RADIOLOGY: CT ANGIO CHEST PE W OR WO CONTRAST  Result Date: 02/06/2020 CLINICAL DATA:  Positive D-dimer with shortness of breath EXAM: CT ANGIOGRAPHY CHEST WITH CONTRAST TECHNIQUE: Multidetector CT imaging of the chest was performed using the standard protocol during bolus administration of intravenous contrast. Multiplanar CT image reconstructions and MIPs were obtained to evaluate the vascular anatomy. CONTRAST:  80mL OMNIPAQUE IOHEXOL 350 MG/ML SOLN COMPARISON:  Chest radiograph February 04, 2020; nuclear medicine perfusion lung scan February 05, 2020 FINDINGS: Cardiovascular: There is no demonstrable pulmonary embolus. No thoracic aortic aneurysm. No dissection evident.  Note that contrast bolus in the aorta is less than optimal for potential dissection assessment. There are scattered foci of great vessel calcification. There are foci of aortic atherosclerosis as well as multiple foci of coronary artery calcification. There is no pericardial effusion or  pericardial thickening. Heart is mildly enlarged. Mediastinum/Nodes: Thyroid appears unremarkable. There are scattered subcentimeter mediastinal lymph nodes. No adenopathy by size criteria evident on this study. There are no appreciable esophageal lesions. Lungs/Pleura: There are areas of scattered bullous disease in the lung bases, more on the right than on the left. There is mild interstitial edema in the lung bases with areas of atelectasis. Ill-defined airspace opacity in the left upper lobe is felt to represent a combination of atelectasis and superimposed focal pneumonia. On axial slice 47 series 6, there is a 3 mm nodular opacity in the anterior segment of the right upper lobe. On axial slice 54 series 6, there is a 7 x 4 mm nodular opacity in the anterior segment of the left upper lobe. On axial slice 66 series 6, there is a 3 mm nodular opacity in the anterior segment of the left upper lobe. No appreciable pleural effusions. Upper Abdomen: There is reflux of contrast into the inferior vena cava and hepatic veins. Gallbladder is absent. There are foci of aortic and proximal superior mesenteric artery calcification. Visualized upper abdominal structures otherwise normal. Musculoskeletal: Total shoulder replacements noted bilaterally. Degenerative change noted in the thoracic spine. No blastic or lytic bone lesions are evident. No chest wall lesions. Review of the MIP images confirms the above findings. IMPRESSION: 1. No demonstrable pulmonary embolus. No thoracic aortic aneurysm. No dissection evident. Note that the contrast bolus in the aorta is less than optimal for potential dissection assessment. There is aortic atherosclerosis as well as foci of great vessel and coronary artery calcification. Mild cardiomegaly. 2. Underlying emphysematous change with scattered areas of atelectasis. Mild bibasilar interstitial edema coupled with cardiomegaly may indicate a degree of congestive heart failure. No pleural  effusions. Area of patchy airspace opacity in the left upper lobe anteriorly may represent a small focus of pneumonia, with atelectasis. 3. Several small nodular appearing opacities. Largest nodular opacity measures 7 x 4 mm in the anterior segment left upper lobe. Non-contrast chest CT at 6-12 months is recommended. If the nodule is stable at time of repeat CT, then future CT at 18-24 months (from today's scan) is considered optional for low-risk patients, but is recommended for high-risk patients. This recommendation follows the consensus statement: Guidelines for Management of Incidental Pulmonary Nodules Detected on CT Images: From the Fleischner Society 2017; Radiology 2017; 284:228-243. 4.  No evident adenopathy. 5. Reflux of contrast into the inferior vena cava and hepatic veins may be indicative of a degree of increase in right heart pressure. 6.  Gallbladder absent.  Total shoulder replacements bilaterally. Aortic Atherosclerosis (ICD10-I70.0) and Emphysema (ICD10-J43.9). Electronically Signed   By: Bretta Bang III M.D.   On: 02/06/2020 13:53   NM Pulmonary Perfusion  Result Date: 02/05/2020 CLINICAL DATA:  PE suspected EXAM: NUCLEAR MEDICINE PERFUSION LUNG SCAN TECHNIQUE: Perfusion images were obtained in multiple projections after intravenous injection of radiopharmaceutical. Ventilation scans intentionally deferred if perfusion scan and chest x-ray adequate for interpretation during COVID 19 epidemic. RADIOPHARMACEUTICALS:  4.2 mCi Tc-71m MAA IV COMPARISON:  Chest radiograph, 02/04/2020 FINDINGS: Normal, homogeneous perfusion of the lungs. No suspicious filling defects. IMPRESSION: Very low probability examination for pulmonary embolism by modified perfusion only PIOPED criteria (PE absent). Electronically Signed  By: Lauralyn Primes M.D.   On: 02/05/2020 09:59   ECHOCARDIOGRAM COMPLETE  Result Date: 02/05/2020    ECHOCARDIOGRAM REPORT   Patient Name:   VENORA KAUTZMAN Date of Exam:  02/05/2020 Medical Rec #:  [id number]       Height:       64.0 in Accession #:    [id number]      Weight:       158.0 lb Date of Birth:  [birth year]       BSA:          1.770 m Patient Age:    89 years        BP:           124/59 mmHg Patient Gender: F               HR:           62 bpm. Exam Location:  Inpatient Procedure: 2D Echo, Color Doppler and Cardiac Doppler Indications:    CHF-Acute Diastolic 428.31 / I50.31  History:        Patient has prior history of Echocardiogram examinations, most                 recent 06/06/2019. CHF, Signs/Symptoms:Shortness of Breath; Risk                 Factors:Hypertension, Dyslipidemia and Diabetes.  Sonographer:    Eulah Pont RDCS Referring Phys: Lang Snow Daylene Katayama [facility] IMPRESSIONS  1. Left ventricular ejection fraction, by estimation, is 60 to 65%. The left ventricle has normal function. The left ventricle has no regional wall motion abnormalities. There is moderate left ventricular hypertrophy. Left ventricular diastolic parameters are consistent with Grade I diastolic dysfunction (impaired relaxation).  2. Right ventricular systolic function is normal. The right ventricular size is normal. There is moderately elevated pulmonary artery systolic pressure at 48 mmHg.  3. Left atrial size was mildly dilated.  4. Tricuspid valve regurgitation is moderate.  5. Compared to previous outpatient study on 06/05/2019, pulmonary hypertension is new finding. FINDINGS  Left Ventricle: Left ventricular ejection fraction, by estimation, is 60 to 65%. The left ventricle has normal function. The left ventricle has no regional wall motion abnormalities. The left ventricular internal cavity size was normal in size. There is  moderate left ventricular hypertrophy. Left ventricular diastolic parameters are consistent with Grade I diastolic dysfunction (impaired relaxation). Right Ventricle: The right ventricular size is normal. No increase in right ventricular wall thickness. Right ventricular  systolic function is normal. There is moderately elevated pulmonary artery systolic pressure. The tricuspid regurgitant velocity is 3.34 m/s, and with an assumed right atrial pressure of 3 mmHg, the estimated right ventricular systolic pressure is 47.6 mmHg. Left Atrium: Left atrial size was mildly dilated. Right Atrium: Right atrial size was normal in size. Pericardium: There is no evidence of pericardial effusion. Mitral Valve: The mitral valve is grossly normal. No evidence of mitral valve regurgitation. Tricuspid Valve: The tricuspid valve is grossly normal. Tricuspid valve regurgitation is moderate. Aortic Valve: The aortic valve is grossly normal. Aortic valve regurgitation is not visualized. Pulmonic Valve: The pulmonic valve was grossly normal. Pulmonic valve regurgitation is not visualized. Aorta: Upper limit normal at 3.5 cm. Venous: The inferior vena cava is normal in size with greater than 50% respiratory variability, suggesting right atrial pressure of 3 mmHg. IAS/Shunts: The interatrial septum is aneurysmal. No atrial level shunt detected by color flow Doppler.  LEFT VENTRICLE PLAX 2D LVIDd:  4.00 cm  Diastology LVIDs:         2.60 cm  LV e' medial:    3.24 cm/s LV PW:         1.40 cm  LV E/e' medial:  17.7 LV IVS:        1.00 cm  LV e' lateral:   3.07 cm/s LVOT diam:     2.00 cm  LV E/e' lateral: 18.7 LV SV:         85 LV SV Index:   48 LVOT Area:     3.14 cm  RIGHT VENTRICLE RV S prime:     11.30 cm/s TAPSE (M-mode): 1.8 cm LEFT ATRIUM             Index       RIGHT ATRIUM           Index LA diam:        3.00 cm 1.70 cm/m  RA Area:     10.60 cm LA Vol (A2C):   48.0 ml 27.12 ml/m RA Volume:   16.10 ml  9.10 ml/m LA Vol (A4C):   62.5 ml 35.31 ml/m LA Biplane Vol: 58.4 ml 33.00 ml/m  AORTIC VALVE LVOT Vmax:   117.00 cm/s LVOT Vmean:  86.300 cm/s LVOT VTI:    0.270 m  AORTA Ao Root diam: 3.10 cm Ao Asc diam:  3.50 cm MITRAL VALVE                TRICUSPID VALVE MV Area (PHT): 1.92 cm     TR  Peak grad:   44.6 mmHg MV Decel Time: 396 msec     TR Vmax:        334.00 cm/s MV E velocity: 57.40 cm/s MV A velocity: 105.00 cm/s  SHUNTS MV E/A ratio:  0.55         Systemic VTI:  0.27 m                             Systemic Diam: 2.00 cm Truett Mainland MD Electronically signed by Truett Mainland MD Signature Date/Time: 02/05/2020/2:24:48 PM    Final    VAS [place] LOWER EXTREMITY VENOUS (DVT)  Result Date: 02/05/2020  Lower Venous DVTStudy Indications: Asymmetric edema.  Anticoagulation: Lovenox. Comparison Study: No prior studies. Performing Technologist: Ednamae Rosenthal  Examination Guidelines: A complete evaluation includes B-mode imaging, spectral Doppler, color Doppler, and power Doppler as needed of all accessible portions of each vessel. Bilateral testing is considered an integral part of a complete examination. Limited examinations for reoccurring indications may be performed as noted. The reflux portion of the exam is performed with the patient in reverse Trendelenburg.  +---------+---------------+---------+-----------+----------+--------------+ RIGHT    CompressibilityPhasicitySpontaneityPropertiesThrombus Aging +---------+---------------+---------+-----------+----------+--------------+ CFV      Full           Yes      Yes                                 +---------+---------------+---------+-----------+----------+--------------+ SFJ      Full                                                        +---------+---------------+---------+-----------+----------+--------------+ FV Prox  Full                                                        +---------+---------------+---------+-----------+----------+--------------+  FV Mid   Full                                                        +---------+---------------+---------+-----------+----------+--------------+ FV DistalFull                                                         +---------+---------------+---------+-----------+----------+--------------+ PFV      Full                                                        +---------+---------------+---------+-----------+----------+--------------+ POP      Full           Yes      Yes                                 +---------+---------------+---------+-----------+----------+--------------+ PTV      Full                                                        +---------+---------------+---------+-----------+----------+--------------+ PERO     Full                                                        +---------+---------------+---------+-----------+----------+--------------+   +---------+---------------+---------+-----------+----------+--------------+ LEFT     CompressibilityPhasicitySpontaneityPropertiesThrombus Aging +---------+---------------+---------+-----------+----------+--------------+ CFV      Full           Yes      Yes                                 +---------+---------------+---------+-----------+----------+--------------+ SFJ      Full                                                        +---------+---------------+---------+-----------+----------+--------------+ FV Prox  Full                                                        +---------+---------------+---------+-----------+----------+--------------+ FV Mid   Full                                                        +---------+---------------+---------+-----------+----------+--------------+  FV DistalFull                                                        +---------+---------------+---------+-----------+----------+--------------+ PFV      Full                                                        +---------+---------------+---------+-----------+----------+--------------+ POP      Full           Yes      Yes                                  +---------+---------------+---------+-----------+----------+--------------+ PTV      Full                                                        +---------+---------------+---------+-----------+----------+--------------+ PERO     Full                                                        +---------+---------------+---------+-----------+----------+--------------+     Summary: RIGHT: - There is no evidence of deep vein thrombosis in the lower extremity.  - No cystic structure found in the popliteal fossa.  LEFT: - There is no evidence of deep vein thrombosis in the lower extremity.  - No cystic structure found in the popliteal fossa.  *See table(s) above for measurements and observations. Electronically signed by Waverly Ferrari MD on 02/05/2020 at 5:38:30 PM.    Final     LABORATORY DATA: Lab Results  Component Value Date   WBC 7.9 02/05/2020   HGB 11.8 (L) 02/05/2020   HCT 37.5 02/05/2020   MCV 80.3 02/05/2020   PLT 301 02/05/2020    Recent Labs  Lab 02/05/20 0313 02/05/20 0313 02/06/20 0219  NA 141   < > 142  K 3.7   < > 4.1  CL 104   < > 109  CO2 25   < > 25  BUN 25*   < > 23  CREATININE 1.61*   < > 1.50*  CALCIUM 9.4   < > 9.1  PROT 7.1  --   --   BILITOT 1.0  --   --   ALKPHOS 83  --   --   ALT 14  --   --   AST 19  --   --   GLUCOSE 65*   < > 70   < > = values in this interval not displayed.    Lipid Panel     Component Value Date/Time   CHOL  02/19/2008 0500    116        ATP III CLASSIFICATION:  <200     mg/dL   Desirable  409-811  mg/dL  Borderline High  >=240    mg/dL   High   TRIG 43 04/54/098111/12/2007 0500   HDL 51 02/19/2008 0500   CHOLHDL 2.3 02/19/2008 0500   VLDL 9 02/19/2008 0500   LDLCALC  02/19/2008 0500    56        Total Cholesterol/HDL:CHD Risk Coronary Heart Disease Risk Table                     Men   Women  1/2 Average Risk   3.4   3.3    BNP (last 3 results) Recent Labs    02/04/20 1143  BNP 136.0*    HEMOGLOBIN  A1C Lab Results  Component Value Date   HGBA1C 8.2 (H) 12/05/2015   MPG 189 12/05/2015    Cardiac Panel (last 3 results) No results for input(s): CKTOTAL, CKMB, RELINDX in the last 8760 hours.  Invalid input(s): TROPONINHS  Lab Results  Component Value Date   CKTOTAL 86 05/21/2011   CKMB 2.1 05/21/2011     TSH No results for input(s): TSH in the last 8760 hours.    CARDIAC DATABASE: EKG: 02/04/2020: Normal sinus rhythm, 65 bpm, left axis deviation, left anterior fascicular block, right bundle branch block, premature atrial contractions, without underlying injury pattern.  Echocardiogram: 02/05/2020: 1. Left ventricular ejection fraction, by estimation, is 60 to 65%. The left ventricle has normal function. The left ventricle has no regional wall motion abnormalities. There is moderate left ventricular hypertrophy.  Left ventricular diastolic parameters are consistent with Grade I diastolic dysfunction (impaired relaxation).  2. Right ventricular systolic function is normal. The right ventricular size is normal. There is moderately elevated pulmonary artery systolic pressure at 48 mmHg.  3. Left atrial size was mildly dilated.  4. Tricuspid valve regurgitation is moderate.  5. Compared to previous outpatient study on 06/05/2019, pulmonary hypertension is new finding.  Stress Testing:  NA  Heart Catheterization: None  IMPRESSION & RECOMMENDATIONS: Jenna Murphy is a 86 y.o. African-American female whose past medical history and cardiovascular risk factors include: Type 2 diabetes mellitus, hypertension, hyperlipidemia, chronic kidney disease stage III a, chronic diastolic heart failure, coronary artery calcification on nongated CT study, former smoker, emphysema, advanced age, postmenopausal female.  Primary Diagnosis:  Acute on chronic heart failure with preserved EF, stage C, NYHA class II/III: Improving  Patient states that her shortness of breath has improved  since admission.  Patient is currently not requiring supplemental oxygen and does not appear to be hypoxic or fluid overloaded.  Echocardiogram notes preserved LVEF, grade 1 diastolic impairment, and elevated left atrial pressure as E/e' >15, mild pulmonary hypertension as per RVSP in the setting of moderate tricuspid regurgitation.  Losartan and spironolactone have been held secondary to acute kidney injury.  Currently getting IV fluids to help resolve her acute kidney injury.  Would recommend reinitiating diuretic therapy tomorrow morning if kidney function continues to downtrend.  In the interim recommend reducing IV fluids from 100 cc/h to 75 cc an hour.  Recommend uptitrating guideline directed medical therapy.  We also discussed undergoing right heart catheterization to evaluate for hemodynamics given the new finding of mild pulmonary hypertension per RVSP on echocardiogram.  Clinical suspicion of pulmonary arterial hypertension is low and if she were to have pulmonary hypertension based on mean PA pressure it may secondary to elevated PCWP as her LAP is high on echo and WHO class III given her history of smoking and emphysema.  Patient would like to hold  off on the right heart catheterization for now until she speaks to her son.  I tried calling her son during today's encounter over speaker phone in the presence of her son but it went to voicemail. Will try again.   If her son Gerda Diss refuses the RHC I would recommend focusing on uptitrating guideline directed medical therapy and consider right heart catheterization as outpatient if her dyspnea does not improve.  She may also benefit from pulmonary evaluation given her history of smoking, emphysematous changes and areas of scattered atelectasis and several nodular opacities.  On discharge would recommend restarting home dose of spironolactone and torsemide 5 mg p.o. daily.  With repeat blood work either with PCP or Dr. Damian Leavell office  in 1 week.  We will arrange outpatient follow-up with Dr. Rosemary Holms as well.  Plan of care discussed with attending physician.  Secondary Diagnosis: Acute kidney injury on chronic kidney disease: Improving.  Currently managed by primary team.  Diabetes mellitus type 2: Educated on the importance of glycemic control.  Currently managed by primary team.  Benign essential hypertension: Systolic blood pressure is currently not at goal most likely secondary to holding spironolactone and other antihypertensive medications.  Reinitiate antihypertensive medications once kidney function improves.  Currently managed by primary team.  Hyperlipidemia: Continue statin therapy.   Time spent: 82 mins. *Total Encounter Time as defined by the Centers for Medicare and Medicaid Services includes, in addition to the face-to-face time of a patient visit (documented in the note above) non-face-to-face time:  reviewing electronic medical record, ordering and reviewing medications, tests or procedures, care coordination (communications with other health care professionals or caregivers) and documentation in the medical record.  Patient's questions and concerns were addressed to her satisfaction. She voices understanding of the instructions provided during this encounter.   This note was created using a voice recognition software as a result there may be grammatical errors inadvertently enclosed that do not reflect the nature of this encounter. Every attempt is made to correct such errors.  Delilah Shan [facility]  Pager: [phone] Office: [phone] 02/06/2020, 6:00 PM

## 2020-02-07 ENCOUNTER — Emergency Department (HOSPITAL_COMMUNITY)
Admission: EM | Admit: 2020-02-07 | Discharge: 2020-02-07 | Disposition: A | Discharge status: Home / Self Care | Payer: Medicare Other | Source: Home / Self Care

## 2020-02-07 ENCOUNTER — Emergency Department (HOSPITAL_COMMUNITY): Payer: Medicare Other

## 2020-02-07 ENCOUNTER — Encounter (HOSPITAL_COMMUNITY): Payer: Self-pay | Admitting: Emergency Medicine

## 2020-02-07 ENCOUNTER — Other Ambulatory Visit: Payer: Self-pay

## 2020-02-07 DIAGNOSIS — Z5321 Procedure and treatment not carried out due to patient leaving prior to being seen by health care provider: Secondary | ICD-10-CM | POA: Insufficient documentation

## 2020-02-07 DIAGNOSIS — S2242XA Multiple fractures of ribs, left side, initial encounter for closed fracture: Secondary | ICD-10-CM | POA: Diagnosis not present

## 2020-02-07 DIAGNOSIS — I5031 Acute diastolic (congestive) heart failure: Secondary | ICD-10-CM

## 2020-02-07 DIAGNOSIS — E1169 Type 2 diabetes mellitus with other specified complication: Secondary | ICD-10-CM

## 2020-02-07 DIAGNOSIS — E785 Hyperlipidemia, unspecified: Secondary | ICD-10-CM

## 2020-02-07 DIAGNOSIS — R0602 Shortness of breath: Secondary | ICD-10-CM | POA: Insufficient documentation

## 2020-02-07 DIAGNOSIS — N1831 Chronic kidney disease, stage 3a: Secondary | ICD-10-CM

## 2020-02-07 DIAGNOSIS — M549 Dorsalgia, unspecified: Secondary | ICD-10-CM | POA: Diagnosis not present

## 2020-02-07 DIAGNOSIS — E1122 Type 2 diabetes mellitus with diabetic chronic kidney disease: Secondary | ICD-10-CM

## 2020-02-07 LAB — BASIC METABOLIC PANEL
Anion gap: 9 (ref 5–15)
BUN: 19 mg/dL (ref 8–23)
CO2: 21 mmol/L — ABNORMAL LOW (ref 22–32)
Calcium: 8.7 mg/dL — ABNORMAL LOW (ref 8.9–10.3)
Chloride: 111 mmol/L (ref 98–111)
Creatinine, Ser: 1.55 mg/dL — ABNORMAL HIGH (ref 0.44–1.00)
GFR, Estimated: 32 mL/min — ABNORMAL LOW (ref >60)
Glucose, Bld: 123 mg/dL — ABNORMAL HIGH (ref 70–99)
Potassium: 3.9 mmol/L (ref 3.5–5.1)
Sodium: 141 mmol/L (ref 135–145)

## 2020-02-07 LAB — GLUCOSE, CAPILLARY
Glucose-Capillary: 109 mg/dL — ABNORMAL HIGH (ref 70–99)
Glucose-Capillary: 207 mg/dL — ABNORMAL HIGH (ref 70–99)

## 2020-02-07 SURGERY — RIGHT HEART CATH

## 2020-02-07 MED ORDER — ISOSORB DINITRATE-HYDRALAZINE 20-37.5 MG PO TABS
1.0000 | ORAL_TABLET | Freq: Three times a day (TID) | ORAL | Status: DC
  Administered 2020-02-07: 1 via ORAL
  Filled 2020-02-07: qty 1

## 2020-02-07 MED ORDER — TORSEMIDE 20 MG PO TABS: 20.0000 mg | ORAL_TABLET | ORAL | 0 refills | Status: DC

## 2020-02-07 MED ORDER — ISOSORB DINITRATE-HYDRALAZINE 20-37.5 MG PO TABS: 1.0000 | ORAL_TABLET | Freq: Three times a day (TID) | ORAL | 0 refills | Status: DC

## 2020-02-07 MED ORDER — METFORMIN HCL 500 MG PO TABS: 500.0000 mg | ORAL_TABLET | Freq: Two times a day (BID) | ORAL | Status: AC

## 2020-02-07 MED ORDER — TORSEMIDE 20 MG PO TABS
20.0000 mg | ORAL_TABLET | ORAL | Status: DC
  Administered 2020-02-07: 20 mg via ORAL
  Filled 2020-02-07: qty 1

## 2020-02-07 NOTE — Discharge Instructions (Signed)

## 2020-02-07 NOTE — Discharge Summary (Signed)
Physician Discharge Summary  Jenna Murphy ZOX:[id number] DOB: 1927/08/14 DOA: 02/04/2020  PCP: Andi DevonShelton, Kimberly, MD  Admit date: 02/04/2020 Discharge date: 02/07/2020  Time spent: 45 minutes  Recommendations for Outpatient Follow-up:  Patient will be discharged to home.  Patient will need to follow up with primary care provider within one week of discharge, repeat BMP.  Follow-up with cardiology.  Follow-up with pulmonology.  Patient should continue medications as prescribed.  Patient should follow a heart healthy/carb modified diet.   Discharge Diagnoses:  Dyspnea on exertion suspect secondary to acute diastolic heart failure exacerbation Acute kidney injury on chronic kidney disease, stage IIIa Diabetes mellitus, type II Essential hypertension Hyperlipidemia  Discharge Condition: stable  Diet recommendation: heart healthy/carb modified  Filed Weights   02/04/20 2148 02/05/20 0144 02/06/20 0325  Weight: 72.4 kg 71.7 kg 72.7 kg    History of present illness:  On 02/04/2020 by Dr. Joanell Risingostin Gherghe Jenna D Williamsis a 86 y.o.femalewith medical history significant oftype 2 diabetes mellitus, hypertension, hyperlipidemia, CKD 3A, chronic diastolic CHF comes into the hospital with complaints of shortness of breath. Patient tells me that this has been somewhat chronic but has progressed in the last 3 weeks, family has noticed that when she walks she gets pretty short of breath, is breathing fast, and her family insisted that she comes to the ED today to get this checked out. She denies any chest pain, denies any palpitations. She denies any fever or chills. She denies any cough or chest congestion. She reports compliance with her home medications, denies any weight gain or weight loss. She denies any swelling in her legs but has not paid attention.  Hospital Course:  Dyspnea on exertion suspect secondary to acute diastolic heart failure exacerbation -Patient has had a history  of chronic dyspnea as noted by outpatient cardiology notes however over the last 3 weeks prior to admission, she was having worsening shortness of breath with activity. -On admission, patient did appear to be slightly volume overloaded with lower extremity edema and bibasilar crackles on examination -Patient was given 1 dose of IV Lasix on admission -VQ scan showed low probability for PE -Lower extremity Doppler negative for DVT -Echocardiogram showed EF 60-65%, G1DD. Moderately elevated pulm artery systolic pressure at 48mmHg. Pulmonary hypertension (new finding)  -Cardiology consulted and appreciated  -Patient continues to have noticeable dyspnea with exertion with minimal movement. -CTA chest obtained today which was negative for PE or thoracic aortic aneurysm.  Patient with underlying emphysematous changes with scattered areas of atelectasis.  Mild bibasilar interstitial edema coupled with cardiomegaly may indicate a degree of CHF.  Several small nodular appearing opacities, largest 7 x 4 mm.  Recommend follow-up in 6 to 12 months. -Discussed with cardiology, had initially recommended possibly doing right heart catheterization however now have opted to change her medications and follow-up as an outpatient.  Change medications to furosemide 20 mg every other day, discontinuing spironolactone, and adding BiDil 20-37.5 mg 1 tablet 3 times daily.  Patient to follow-up with Dr. Rosemary HolmsPatwardhan, cardiology.  Acute kidney injury on chronic kidney disease, stage IIIa -Baseline creatinine approximately 1.2, however on admission was 1.8 (of note patient was on Metformin along with losartan and spironolactone prior to admission) -Patient was given 1 dose of IV Lasix due to CHF-Metformin was held -UA unremarkable for infection -Creatinine down to 1.55 today -Repeat BMP in one week  Diabetes mellitus, type II -Resume Metformin on 02/09/2020 -Continue Toujeo on discharge  Essential hypertension -Continue  metoprolol  Hyperlipidemia -  Continue statin  Consultants Cardiology  Procedures  Echocardiogram VQ scan Lower extremity Doppler   Discharge Exam: Vitals:   02/07/20 0319 02/07/20 0741  BP: (!) 157/64 (!) 156/64  Pulse: 64 (!) 59  Resp: 18 20  Temp: 98.4 F (36.9 C) 98.4 F (36.9 C)  SpO2: 97% 97%     General: Well developed, well nourished, elderly, NAD, appears stated age  HEENT: NCAT, mucous membranes moist.  Cardiovascular: S1 S2 auscultated, RRR  Respiratory: Clear to auscultation bilaterally  Abdomen: Soft, nontender, nondistended, + bowel sounds  Extremities: warm dry without cyanosis clubbing or edema  Neuro: AAOx3, Nonfocal  Psych: appropriate mood and affect, pleasant  Discharge Instructions Discharge Instructions    Diet - low sodium heart healthy   Complete by: As directed    Diet Carb Modified   Complete by: As directed    Discharge instructions   Complete by: As directed    Patient will be discharged to home.  Patient will need to follow up with primary care provider within one week of discharge, repeat BMP.  Follow-up with cardiology.  Follow-up with pulmonology.  Patient should continue medications as prescribed.  Patient should follow a heart healthy/carb modified diet.  Resume Metformin on 02/09/2020.   Increase activity slowly   Complete by: As directed      Allergies as of 02/07/2020   No Active Allergies     Medication List    STOP taking these medications   amLODipine 5 MG tablet Commonly known as: NORVASC   furosemide 40 MG tablet Commonly known as: LASIX   ibuprofen 400 MG tablet Commonly known as: ADVIL   spironolactone 25 MG tablet Commonly known as: ALDACTONE   spironolactone 50 MG tablet Commonly known as: ALDACTONE     TAKE these medications   atorvastatin 20 MG tablet Commonly known as: LIPITOR Take 20 mg by mouth daily.   cholecalciferol 25 MCG (1000 UNIT) tablet Commonly known as: VITAMIN D3 Take  1,000 Units by mouth daily.   isosorbide-hydrALAZINE 20-37.5 MG tablet Commonly known as: BIDIL Take 1 tablet by mouth 3 (three) times daily.   metFORMIN 500 MG tablet Commonly known as: GLUCOPHAGE Take 1 tablet (500 mg total) by mouth 2 (two) times daily with a meal. Resume metformin on 02/09/2020 What changed: additional instructions   metoprolol tartrate 50 MG tablet Commonly known as: LOPRESSOR TAKE 1 AND 1/2 TABLETS BY MOUTH TWICE DAILY   OXYGEN Inhale into the lungs.   torsemide 20 MG tablet Commonly known as: DEMADEX Take 1 tablet (20 mg total) by mouth every other day. Start taking on: February 09, 2020   Toujeo SoloStar 300 UNIT/ML Solostar Pen Generic drug: insulin glargine (1 Unit Dial) Inject 30 Units into the skin daily.      No Active Allergies  Follow-up Information    Elder Negus, MD Follow up on 02/18/2020.   Specialties: Cardiology, Radiology Why: 2:30 PM Contact information: [address] [place] [address] [phone]        Andi Devon, MD. Schedule an appointment as soon as possible for a visit in 1 week(s).   Specialty: Internal Medicine Why: Hospital follow up and discuss CT finding, may need pulmonology referral  Contact information: [address] [place] [address] [phone]                The results of significant diagnostics from this hospitalization (including imaging, microbiology, ancillary and laboratory) are listed below for reference.  Significant Diagnostic Studies: DG Chest 2 View  Result Date: 02/04/2020 CLINICAL DATA:  Shortness of breath EXAM: CHEST - 2 VIEW COMPARISON:  04/11/2019 FINDINGS: Chronic interstitial prominence. No new consolidation or edema. No pleural effusion or pneumothorax. Stable cardiomediastinal contours with mild cardiomegaly. No acute osseous abnormality. Bilateral reverse shoulder arthroplasties. IMPRESSION: No acute process in the  chest. Chronic interstitial changes and stable cardiomegaly. Electronically Signed   By: Guadlupe Spanish M.D.   On: 02/04/2020 11:59   CT ANGIO CHEST PE W OR WO CONTRAST  Result Date: 02/06/2020 CLINICAL DATA:  Positive D-dimer with shortness of breath EXAM: CT ANGIOGRAPHY CHEST WITH CONTRAST TECHNIQUE: Multidetector CT imaging of the chest was performed using the standard protocol during bolus administration of intravenous contrast. Multiplanar CT image reconstructions and MIPs were obtained to evaluate the vascular anatomy. CONTRAST:  42mL OMNIPAQUE IOHEXOL 350 MG/ML SOLN COMPARISON:  Chest radiograph February 04, 2020; nuclear medicine perfusion lung scan February 05, 2020 FINDINGS: Cardiovascular: There is no demonstrable pulmonary embolus. No thoracic aortic aneurysm. No dissection evident. Note that contrast bolus in the aorta is less than optimal for potential dissection assessment. There are scattered foci of great vessel calcification. There are foci of aortic atherosclerosis as well as multiple foci of coronary artery calcification. There is no pericardial effusion or pericardial thickening. Heart is mildly enlarged. Mediastinum/Nodes: Thyroid appears unremarkable. There are scattered subcentimeter mediastinal lymph nodes. No adenopathy by size criteria evident on this study. There are no appreciable esophageal lesions. Lungs/Pleura: There are areas of scattered bullous disease in the lung bases, more on the right than on the left. There is mild interstitial edema in the lung bases with areas of atelectasis. Ill-defined airspace opacity in the left upper lobe is felt to represent a combination of atelectasis and superimposed focal pneumonia. On axial slice 47 series 6, there is a 3 mm nodular opacity in the anterior segment of the right upper lobe. On axial slice 54 series 6, there is a 7 x 4 mm nodular opacity in the anterior segment of the left upper lobe. On axial slice 66 series 6, there is a 3 mm  nodular opacity in the anterior segment of the left upper lobe. No appreciable pleural effusions. Upper Abdomen: There is reflux of contrast into the inferior vena cava and hepatic veins. Gallbladder is absent. There are foci of aortic and proximal superior mesenteric artery calcification. Visualized upper abdominal structures otherwise normal. Musculoskeletal: Total shoulder replacements noted bilaterally. Degenerative change noted in the thoracic spine. No blastic or lytic bone lesions are evident. No chest wall lesions. Review of the MIP images confirms the above findings. IMPRESSION: 1. No demonstrable pulmonary embolus. No thoracic aortic aneurysm. No dissection evident. Note that the contrast bolus in the aorta is less than optimal for potential dissection assessment. There is aortic atherosclerosis as well as foci of great vessel and coronary artery calcification. Mild cardiomegaly. 2. Underlying emphysematous change with scattered areas of atelectasis. Mild bibasilar interstitial edema coupled with cardiomegaly may indicate a degree of congestive heart failure. No pleural effusions. Area of patchy airspace opacity in the left upper lobe anteriorly may represent a small focus of pneumonia, with atelectasis. 3. Several small nodular appearing opacities. Largest nodular opacity measures 7 x 4 mm in the anterior segment left upper lobe. Non-contrast chest CT at 6-12 months is recommended. If the nodule is stable at time of repeat CT, then future CT at 18-24 months (from today's scan) is considered optional for low-risk patients, but is  recommended for high-risk patients. This recommendation follows the consensus statement: Guidelines for Management of Incidental Pulmonary Nodules Detected on CT Images: From the Fleischner Society 2017; Radiology 2017; 284:228-243. 4.  No evident adenopathy. 5. Reflux of contrast into the inferior vena cava and hepatic veins may be indicative of a degree of increase in right  heart pressure. 6.  Gallbladder absent.  Total shoulder replacements bilaterally. Aortic Atherosclerosis (ICD10-I70.0) and Emphysema (ICD10-J43.9). Electronically Signed   By: Bretta Bang III M.D.   On: 02/06/2020 13:53   NM Pulmonary Perfusion  Result Date: 02/05/2020 CLINICAL DATA:  PE suspected EXAM: NUCLEAR MEDICINE PERFUSION LUNG SCAN TECHNIQUE: Perfusion images were obtained in multiple projections after intravenous injection of radiopharmaceutical. Ventilation scans intentionally deferred if perfusion scan and chest x-ray adequate for interpretation during COVID 19 epidemic. RADIOPHARMACEUTICALS:  4.2 mCi Tc-44m MAA IV COMPARISON:  Chest radiograph, 02/04/2020 FINDINGS: Normal, homogeneous perfusion of the lungs. No suspicious filling defects. IMPRESSION: Very low probability examination for pulmonary embolism by modified perfusion only PIOPED criteria (PE absent). Electronically Signed   By: Lauralyn Primes M.D.   On: 02/05/2020 09:59   ECHOCARDIOGRAM COMPLETE  Result Date: 02/05/2020    ECHOCARDIOGRAM REPORT   Patient Name:   Jenna Murphy Date of Exam: 02/05/2020 Medical Rec #:  [id number]       Height:       64.0 in Accession #:    [id number]      Weight:       158.0 lb Date of Birth:  1928-03-21       BSA:          1.770 m Patient Age:    86 years        BP:           124/59 mmHg Patient Gender: F               HR:           62 bpm. Exam Location:  Inpatient Procedure: 2D Echo, Color Doppler and Cardiac Doppler Indications:    CHF-Acute Diastolic 428.31 / I50.31  History:        Patient has prior history of Echocardiogram examinations, most                 recent 06/06/2019. CHF, Signs/Symptoms:Shortness of Breath; Risk                 Factors:Hypertension, Dyslipidemia and Diabetes.  Sonographer:    Eulah Pont RDCS Referring Phys: Lang Snow Daylene [facility] IMPRESSIONS  1. Left ventricular ejection fraction, by estimation, is 60 to 65%. The left ventricle has normal function. The left  ventricle has no regional wall motion abnormalities. There is moderate left ventricular hypertrophy. Left ventricular diastolic parameters are consistent with Grade I diastolic dysfunction (impaired relaxation).  2. Right ventricular systolic function is normal. The right ventricular size is normal. There is moderately elevated pulmonary artery systolic pressure at 48 mmHg.  3. Left atrial size was mildly dilated.  4. Tricuspid valve regurgitation is moderate.  5. Compared to previous outpatient study on 06/05/2019, pulmonary hypertension is new finding. FINDINGS  Left Ventricle: Left ventricular ejection fraction, by estimation, is 60 to 65%. The left ventricle has normal function. The left ventricle has no regional wall motion abnormalities. The left ventricular internal cavity size was normal in size. There is  moderate left ventricular hypertrophy. Left ventricular diastolic parameters are consistent with Grade I diastolic dysfunction (impaired relaxation). Right Ventricle: The right ventricular size  is normal. No increase in right ventricular wall thickness. Right ventricular systolic function is normal. There is moderately elevated pulmonary artery systolic pressure. The tricuspid regurgitant velocity is 3.34 m/s, and with an assumed right atrial pressure of 3 mmHg, the estimated right ventricular systolic pressure is 47.6 mmHg. Left Atrium: Left atrial size was mildly dilated. Right Atrium: Right atrial size was normal in size. Pericardium: There is no evidence of pericardial effusion. Mitral Valve: The mitral valve is grossly normal. No evidence of mitral valve regurgitation. Tricuspid Valve: The tricuspid valve is grossly normal. Tricuspid valve regurgitation is moderate. Aortic Valve: The aortic valve is grossly normal. Aortic valve regurgitation is not visualized. Pulmonic Valve: The pulmonic valve was grossly normal. Pulmonic valve regurgitation is not visualized. Aorta: Upper limit normal at 3.5 cm.  Venous: The inferior vena cava is normal in size with greater than 50% respiratory variability, suggesting right atrial pressure of 3 mmHg. IAS/Shunts: The interatrial septum is aneurysmal. No atrial level shunt detected by color flow Doppler.  LEFT VENTRICLE PLAX 2D LVIDd:         4.00 cm  Diastology LVIDs:         2.60 cm  LV e' medial:    3.24 cm/s LV PW:         1.40 cm  LV E/e' medial:  17.7 LV IVS:        1.00 cm  LV e' lateral:   3.07 cm/s LVOT diam:     2.00 cm  LV E/e' lateral: 18.7 LV SV:         85 LV SV Index:   48 LVOT Area:     3.14 cm  RIGHT VENTRICLE RV S prime:     11.30 cm/s TAPSE (M-mode): 1.8 cm LEFT ATRIUM             Index       RIGHT ATRIUM           Index LA diam:        3.00 cm 1.70 cm/m  RA Area:     10.60 cm LA Vol (A2C):   48.0 ml 27.12 ml/m RA Volume:   16.10 ml  9.10 ml/m LA Vol (A4C):   62.5 ml 35.31 ml/m LA Biplane Vol: 58.4 ml 33.00 ml/m  AORTIC VALVE LVOT Vmax:   117.00 cm/s LVOT Vmean:  86.300 cm/s LVOT VTI:    0.270 m  AORTA Ao Root diam: 3.10 cm Ao Asc diam:  3.50 cm MITRAL VALVE                TRICUSPID VALVE MV Area (PHT): 1.92 cm     TR Peak grad:   44.6 mmHg MV Decel Time: 396 msec     TR Vmax:        334.00 cm/s MV E velocity: 57.40 cm/s MV A velocity: 105.00 cm/s  SHUNTS MV E/A ratio:  0.55         Systemic VTI:  0.27 m                             Systemic Diam: 2.00 cm Truett Mainland MD Electronically signed by Truett Mainland MD Signature Date/Time: 02/05/2020/2:24:48 PM    Final    VAS [place] LOWER EXTREMITY VENOUS (DVT)  Result Date: 02/05/2020  Lower Venous DVTStudy Indications: Asymmetric edema.  Anticoagulation: Lovenox. Comparison Study: No prior studies. Performing Technologist: Freja Rosenthal  Examination Guidelines: A complete evaluation includes  B-mode imaging, spectral Doppler, color Doppler, and power Doppler as needed of all accessible portions of each vessel. Bilateral testing is considered an integral part of a complete examination. Limited  examinations for reoccurring indications may be performed as noted. The reflux portion of the exam is performed with the patient in reverse Trendelenburg.  +---------+---------------+---------+-----------+----------+--------------+ RIGHT    CompressibilityPhasicitySpontaneityPropertiesThrombus Aging +---------+---------------+---------+-----------+----------+--------------+ CFV      Full           Yes      Yes                                 +---------+---------------+---------+-----------+----------+--------------+ SFJ      Full                                                        +---------+---------------+---------+-----------+----------+--------------+ FV Prox  Full                                                        +---------+---------------+---------+-----------+----------+--------------+ FV Mid   Full                                                        +---------+---------------+---------+-----------+----------+--------------+ FV DistalFull                                                        +---------+---------------+---------+-----------+----------+--------------+ PFV      Full                                                        +---------+---------------+---------+-----------+----------+--------------+ POP      Full           Yes      Yes                                 +---------+---------------+---------+-----------+----------+--------------+ PTV      Full                                                        +---------+---------------+---------+-----------+----------+--------------+ PERO     Full                                                        +---------+---------------+---------+-----------+----------+--------------+   +---------+---------------+---------+-----------+----------+--------------+  LEFT     CompressibilityPhasicitySpontaneityPropertiesThrombus Aging  +---------+---------------+---------+-----------+----------+--------------+ CFV      Full           Yes      Yes                                 +---------+---------------+---------+-----------+----------+--------------+ SFJ      Full                                                        +---------+---------------+---------+-----------+----------+--------------+ FV Prox  Full                                                        +---------+---------------+---------+-----------+----------+--------------+ FV Mid   Full                                                        +---------+---------------+---------+-----------+----------+--------------+ FV DistalFull                                                        +---------+---------------+---------+-----------+----------+--------------+ PFV      Full                                                        +---------+---------------+---------+-----------+----------+--------------+ POP      Full           Yes      Yes                                 +---------+---------------+---------+-----------+----------+--------------+ PTV      Full                                                        +---------+---------------+---------+-----------+----------+--------------+ PERO     Full                                                        +---------+---------------+---------+-----------+----------+--------------+     Summary: RIGHT: - There is no evidence of deep vein thrombosis in the lower extremity.  - No cystic structure found in the popliteal fossa.  LEFT: - There is no evidence of deep vein thrombosis in the lower extremity.  - No  cystic structure found in the popliteal fossa.  *See table(s) above for measurements and observations. Electronically signed by Waverly Ferrari MD on 02/05/2020 at 5:38:30 PM.    Final     Microbiology: Recent Results (from the past 240 hour(s))  Respiratory Panel by  RT PCR (Flu A&B, Covid) - Nasopharyngeal Swab     Status: None   Collection Time: 02/04/20 12:58 PM   Specimen: Nasopharyngeal Swab  Result Value Ref Range Status   SARS Coronavirus 2 by RT PCR NEGATIVE NEGATIVE Final    Comment: (NOTE) SARS-CoV-2 target nucleic acids are NOT DETECTED.  The SARS-CoV-2 RNA is generally detectable in upper respiratoy specimens during the acute phase of infection. The lowest concentration of SARS-CoV-2 viral copies this assay can detect is 131 copies/mL. A negative result does not preclude SARS-Cov-2 infection and should not be used as the sole basis for treatment or other patient management decisions. A negative result may occur with  improper specimen collection/handling, submission of specimen other than nasopharyngeal swab, presence of viral mutation(s) within the areas targeted by this assay, and inadequate number of viral copies (<131 copies/mL). A negative result must be combined with clinical observations, patient history, and epidemiological information. The expected result is Negative.  Fact Sheet for Patients:  https://www.moore.com/  Fact Sheet for Healthcare Providers:  https://www.young.biz/  This test is no t yet approved or cleared by the [place] FDA and  has been authorized for detection and/or diagnosis of SARS-CoV-2 by FDA under an Emergency Use Authorization (EUA). This EUA will remain  in effect (meaning this test can be used) for the duration of the COVID-19 declaration under Section 564(b)(1) of the Act, 21 U.S.C. section 360bbb-3(b)(1), unless the authorization is terminated or revoked sooner.     Influenza A by PCR NEGATIVE NEGATIVE Final   Influenza B by PCR NEGATIVE NEGATIVE Final    Comment: (NOTE) The Xpert Xpress SARS-CoV-2/FLU/RSV assay is intended as an aid in  the diagnosis of influenza from Nasopharyngeal swab specimens and  should not be used as a sole basis for  treatment. Nasal washings and  aspirates are unacceptable for Xpert Xpress SARS-CoV-2/FLU/RSV  testing.  Fact Sheet for Patients: https://www.moore.com/  Fact Sheet for Healthcare Providers: https://www.young.biz/  This test is not yet approved or cleared by the [place] FDA and  has been authorized for detection and/or diagnosis of SARS-CoV-2 by  FDA under an Emergency Use Authorization (EUA). This EUA will remain  in effect (meaning this test can be used) for the duration of the  Covid-19 declaration under Section 564(b)(1) of the Act, 21  U.S.C. section 360bbb-3(b)(1), unless the authorization is  terminated or revoked. Performed at [facility], [address] [place], [place] [address]   Urine culture     Status: Abnormal   Collection Time: 02/04/20  8:12 PM   Specimen: Urine, Random  Result Value Ref Range Status   Specimen Description URINE, RANDOM  Final   Special Requests   Final    NONE Performed at [facility], [address], [place], [place] [address]    Culture MULTIPLE SPECIES PRESENT, SUGGEST RECOLLECTION (A)  Final   Report Status 02/06/2020 FINAL  Final     Labs: Basic Metabolic Panel: Recent Labs  Lab 02/04/20 1143 02/05/20 0313 02/06/20 0219 02/07/20 0312  NA 141 141 142 141  K 4.3 3.7 4.1 3.9  CL 106 104 109 111  CO2 23 25 25  21*  GLUCOSE 157* 65* 70  123*  BUN 26* 25* 23 19  CREATININE 1.82* 1.61* 1.50* 1.55*  CALCIUM 9.8 9.4 9.1 8.7*   Liver Function Tests: Recent Labs  Lab 02/05/20 0313  AST 19  ALT 14  ALKPHOS 83  BILITOT 1.0  PROT 7.1  ALBUMIN 3.5   No results for input(s): LIPASE, AMYLASE in the last 168 hours. No results for input(s): AMMONIA in the last 168 hours. CBC: Recent Labs  Lab 02/04/20 1143 02/05/20 0313  WBC 7.9 7.9  HGB 11.3* 11.8*  HCT 36.9 37.5  MCV 81.8 80.3  PLT 299 301   Cardiac Enzymes: No results for input(s): CKTOTAL, CKMB, CKMBINDEX,  TROPONINI in the last 168 hours. BNP: BNP (last 3 results) Recent Labs    02/04/20 1143  BNP 136.0*    ProBNP (last 3 results) No results for input(s): PROBNP in the last 8760 hours.  CBG: Recent Labs  Lab 02/05/20 2114 02/06/20 0622 02/06/20 1109 02/06/20 2122 02/07/20 0654  GLUCAP 126* 93 110* 144* 109*       Signed:  Zori Benbrook  [facility] 02/07/2020, 10:19 AM

## 2020-02-07 NOTE — Progress Notes (Signed)
  Mobility Specialist Criteria Algorithm Info.  SATURATION QUALIFICATIONS: (This note is used to comply with regulatory documentation for home oxygen)  Patient Saturations on Room Air at Rest = 94%  Patient Saturations on Room Air while Ambulating = 93%  Patient Saturations on 0 Liters of oxygen while Ambulating = n/a%  Please briefly explain why patient needs home oxygen:  Mobility Team:  HOB elevated: Activity: Ambulated in hall (In chair before and after ambulation) Range of motion: Active;All extremities Level of assistance: Standby assist, set-up cues, supervision of patient - no hands on Assistive device: Front wheel walker Minutes sitting in chair:  Minutes stood: 5 minutes Minutes ambulated: 5 minutes Distance ambulated (ft): 260 ft Mobility response: Tolerated well Bed Position: Chair (Recliner)  Pt received in chair eager to participate with mobility with anticipation to be discharged soon. Before ambulation we reviewed proper walking mechanics w/RW and pursed breathing techniques for energy conservation. Pt stood and ambulated in hallway for 261ft with supervision, demonstrating understanding of proper mechanics. Pt tolerated ambulation well and didn't require any use of supplemental oxygen.   02/07/2020 12:01 PM

## 2020-02-07 NOTE — TOC Transition Note (Signed)
Transition of Care [facility]) - CM/SW Discharge Note   Patient Details  Name: Jenna Murphy MRN: [id number] Date of Birth: 1927-04-13  [facility]) CM/SW Contact:  Leone Haven, RN Phone Number: 02/07/2020, 11:54 AM   Clinical Narrative:    Patient is for dc today, she ambulated with mobility tech [place] today.  She did well .  Patient states she has  A walker and a cane at home.  NCM asked if she would like a HHRN for CHF management , she states no, she does not need that.  She states she has no transportation issues or no food issues.    Final next level of care: Home/Self Care Barriers to Discharge: No Barriers Identified   Patient Goals and CMS Choice Patient states their goals for this hospitalization and ongoing recovery are:: get ready   Choice offered to / list presented to : NA  Discharge Placement                       Discharge Plan and Services                  DME Agency: NA       HH Arranged: NA          Social Determinants of Health (SDOH) Interventions     Readmission Risk Interventions No flowsheet data found.

## 2020-02-07 NOTE — Progress Notes (Signed)
Pt dozed off while on the the recliner chair ,alarm in place,Then suddenly pt woke up confuse, doesn't know where she is and a bit hallucinating as per NT who assisted the patient first.   Just Few minutes pt realized where she  is and more lucid. Able to recognized her tech and her Nurse. Patient was assisted to the bathroom and she spoke to her daughter on th phone.    will monitor and Fall precaution in place.

## 2020-02-07 NOTE — Plan of Care (Signed)
  Problem: Education: Goal: Ability to demonstrate management of disease process will improve Outcome: Adequate for Discharge   

## 2020-02-07 NOTE — ED Triage Notes (Signed)
Pt got dc from the hospital today and ones at home she started feeling SOB again.

## 2020-02-07 NOTE — ED Notes (Signed)
Pt is refusing to be seen. States "my son is coming to pick me up"

## 2020-02-07 NOTE — ED Notes (Signed)
Pt refusing to be seen states she feels better now and her son is picking her up.

## 2020-02-07 NOTE — Progress Notes (Signed)
Subjective:  Feels well. Wants to go home, Breathing is better.  Objective:  Vital Signs in the last 24 hours: Temp:  [98.3 F (36.8 C)-98.4 F (36.9 C)] 98.4 F (36.9 C) (10/28 0741) Pulse Rate:  [59-64] 59 (10/28 0741) Resp:  [18-20] 20 (10/28 0741) BP: (142-157)/(64-73) 156/64 (10/28 0741) SpO2:  [94 %-97 %] 97 % (10/28 0741)  Intake/Output from previous day: 10/27 0701 - 10/28 0700 In: 440 [P.O.:440] Out: 1850 [Urine:1850]  Physical Exam Vitals and nursing note reviewed.  Constitutional:      General: She is not in acute distress.    Appearance: She is well-developed.  HENT:     Head: Normocephalic and atraumatic.  Eyes:     Conjunctiva/sclera: Conjunctivae normal.     Pupils: Pupils are equal, round, and reactive to light.  Neck:     Vascular: No JVD.  Cardiovascular:     Rate and Rhythm: Normal rate and regular rhythm.     Pulses: Normal pulses and intact distal pulses.     Heart sounds: No murmur heard.   Pulmonary:     Effort: Pulmonary effort is normal.     Breath sounds: Rales (Mild bibasilar rales) present. No wheezing.  Abdominal:     General: Bowel sounds are normal.     Palpations: Abdomen is soft.     Tenderness: There is no rebound.  Musculoskeletal:        General: No tenderness. Normal range of motion.     Left lower leg: No edema.  Lymphadenopathy:     Cervical: No cervical adenopathy.  Skin:    General: Skin is warm and dry.  Neurological:     Mental Status: She is alert and oriented to person, place, and time.     Cranial Nerves: No cranial nerve deficit.      Lab Results: BMP Recent Labs    04/11/19 1018 04/11/19 1018 06/25/19 1427 02/04/20 1143 02/05/20 0313 02/06/20 0219 02/07/20 0312  NA 139  --  146*   < > 141 142 141  K 4.1   < > 5.0   < > 3.7 4.1 3.9  CL 108   < > 106   < > 104 109 111  CO2 20*   < > 23   < > 25 25 21*  GLUCOSE 203*  --  124*   < > 65* 70 123*  BUN 12  --  13   < > 25* 23 19  CREATININE 1.10*   < >  1.07*   < > 1.61* 1.50* 1.55*  CALCIUM 9.2   < > 9.9   < > 9.4 9.1 8.7*  GFRNONAA 44*   < > 46*   < > 30* 33* 32*  GFRAA 52*  --  53*  --   --   --   --    < > = values in this interval not displayed.    CBC Recent Labs  Lab 02/05/20 0313  WBC 7.9  RBC 4.67  HGB 11.8*  HCT 37.5  PLT 301  MCV 80.3  MCH 25.3*  MCHC 31.5  RDW 14.6    HEMOGLOBIN A1C Lab Results  Component Value Date   HGBA1C 8.2 (H) 12/05/2015   MPG 189 12/05/2015    Cardiac Panel (last 3 results) No results for input(s): CKTOTAL, CKMB, TROPONINI, RELINDX in the last 8760 hours.  BNP (last 3 results) Recent Labs    02/04/20 1143  BNP 136.0*   Lipid Panel  Component Value Date/Time   CHOL  02/19/2008 0500    116        ATP III CLASSIFICATION:  <200     mg/dL   Desirable  222-979  mg/dL   Borderline High  >=892    mg/dL   High   TRIG 43 11/94/1740 0500   HDL 51 02/19/2008 0500   CHOLHDL 2.3 02/19/2008 0500   VLDL 9 02/19/2008 0500   LDLCALC  02/19/2008 0500    56        Total Cholesterol/HDL:CHD Risk Coronary Heart Disease Risk Table                     Men   Women  1/2 Average Risk   3.4   3.3     Hepatic Function Panel Recent Labs    02/05/20 0313  PROT 7.1  ALBUMIN 3.5  AST 19  ALT 14  ALKPHOS 83  BILITOT 1.0   Cardiac Studies:  EKG 02/04/2020:  Normal sinus rhythm, 65 bpm, left axis deviation, left anterior fascicular block, right bundle branch block, premature atrial contractions, without underlying injury pattern.   Echocardiogram 06/05/2019:  1. Normal LV systolic function with visual EF 60-65%. Left ventricle  cavity is normal in size. Normal global wall motion. Doppler evidence of  grade I (impaired) diastolic dysfunction, elevated LAP. Mild left  ventricular hypertrophy. No obvious regional wall motion abnormalities.  Calculated EF 62%.  2. Left atrial cavity is mildly dilated.  3. Trileaflet aortic valve with no regurgitation. Trace aortic valve  stenosis.   4. Structurally normal mitral valve. No evidence of mitral stenosis. Mild  (Grade I) mitral regurgitation.  5. No evidence of tricuspid stenosis. Mild tricuspid regurgitation. No  evidence of pulmonary hypertension.  6. Prior study dated 01/19/2018: LVEF 55%, grade 1 diastolic impairment,  mild concentric hypertrophy, elevated left atrial pressure, mildly dilated  left atrium, mild MR, mild TR.   Assessment & Recommendations:  86 y/o [place] American female with hypertension, type 2 DM, PAD, with acute respiratory failure  Acute respiratory failure: Now improving.  Patient appears comfortable at rest, saturating well on room air. Physical exam with mild bibasilar Rales. Work-up shows mild congestion, mild pulmonary hypertension, pulmonary nodules of unclear significance. I suspect her overall presentation is that due to her acute on chronic diastolic heart failure. Recommend stopping IV fluids.  Renal function is more or less stable.  Okay to discharge home on oral torsemide 20 mg every other day, as long as she does not have any profound hypoxia on ambulation. Hold off right heart catheterization for now. Recommend adding BiDil 20-37.5 mg 1 tablet 3 times daily.  If cost is positive, this could be changed to hydralazine and Isordil separately. Do not recommend spironolactone at this time given low GFR.  Hypertension: As above.    Cardiology signing off.  I will arrange outpatient follow-up in the next 1-2 weeks, along with repeat BMP.     Elder Negus, MD Pager: [phone] Office: [phone]

## 2020-02-08 ENCOUNTER — Other Ambulatory Visit: Payer: Self-pay

## 2020-02-08 ENCOUNTER — Inpatient Hospital Stay (HOSPITAL_COMMUNITY)
Admission: EM | Admit: 2020-02-08 | Discharge: 2020-02-13 | DRG: 183 | Disposition: A | Discharge status: Skilled Nursing Facility | Payer: Medicare Other | Attending: Internal Medicine | Admitting: Internal Medicine

## 2020-02-08 ENCOUNTER — Encounter (HOSPITAL_COMMUNITY): Payer: Self-pay

## 2020-02-08 ENCOUNTER — Emergency Department (HOSPITAL_COMMUNITY): Payer: Medicare Other

## 2020-02-08 DIAGNOSIS — R9389 Abnormal findings on diagnostic imaging of other specified body structures: Secondary | ICD-10-CM

## 2020-02-08 DIAGNOSIS — Z7984 Long term (current) use of oral hypoglycemic drugs: Secondary | ICD-10-CM

## 2020-02-08 DIAGNOSIS — S32029A Unspecified fracture of second lumbar vertebra, initial encounter for closed fracture: Secondary | ICD-10-CM | POA: Diagnosis present

## 2020-02-08 DIAGNOSIS — I509 Heart failure, unspecified: Secondary | ICD-10-CM

## 2020-02-08 DIAGNOSIS — N179 Acute kidney failure, unspecified: Secondary | ICD-10-CM | POA: Diagnosis present

## 2020-02-08 DIAGNOSIS — W182XXA Fall in (into) shower or empty bathtub, initial encounter: Secondary | ICD-10-CM | POA: Diagnosis present

## 2020-02-08 DIAGNOSIS — E1122 Type 2 diabetes mellitus with diabetic chronic kidney disease: Secondary | ICD-10-CM | POA: Diagnosis present

## 2020-02-08 DIAGNOSIS — Z794 Long term (current) use of insulin: Secondary | ICD-10-CM

## 2020-02-08 DIAGNOSIS — S2242XA Multiple fractures of ribs, left side, initial encounter for closed fracture: Principal | ICD-10-CM | POA: Diagnosis present

## 2020-02-08 DIAGNOSIS — S32019A Unspecified fracture of first lumbar vertebra, initial encounter for closed fracture: Secondary | ICD-10-CM | POA: Diagnosis present

## 2020-02-08 DIAGNOSIS — Z20822 Contact with and (suspected) exposure to covid-19: Secondary | ICD-10-CM | POA: Diagnosis present

## 2020-02-08 DIAGNOSIS — S32009A Unspecified fracture of unspecified lumbar vertebra, initial encounter for closed fracture: Secondary | ICD-10-CM

## 2020-02-08 DIAGNOSIS — Z8249 Family history of ischemic heart disease and other diseases of the circulatory system: Secondary | ICD-10-CM

## 2020-02-08 DIAGNOSIS — Y93E1 Activity, personal bathing and showering: Secondary | ICD-10-CM

## 2020-02-08 DIAGNOSIS — Z9981 Dependence on supplemental oxygen: Secondary | ICD-10-CM

## 2020-02-08 DIAGNOSIS — R911 Solitary pulmonary nodule: Secondary | ICD-10-CM | POA: Diagnosis present

## 2020-02-08 DIAGNOSIS — Z9049 Acquired absence of other specified parts of digestive tract: Secondary | ICD-10-CM

## 2020-02-08 DIAGNOSIS — S32049A Unspecified fracture of fourth lumbar vertebra, initial encounter for closed fracture: Secondary | ICD-10-CM | POA: Diagnosis present

## 2020-02-08 DIAGNOSIS — I5032 Chronic diastolic (congestive) heart failure: Secondary | ICD-10-CM | POA: Diagnosis present

## 2020-02-08 DIAGNOSIS — I10 Essential (primary) hypertension: Secondary | ICD-10-CM | POA: Diagnosis present

## 2020-02-08 DIAGNOSIS — M549 Dorsalgia, unspecified: Secondary | ICD-10-CM

## 2020-02-08 DIAGNOSIS — W19XXXA Unspecified fall, initial encounter: Secondary | ICD-10-CM

## 2020-02-08 DIAGNOSIS — Y92002 Bathroom of unspecified non-institutional (private) residence single-family (private) house as the place of occurrence of the external cause: Secondary | ICD-10-CM

## 2020-02-08 DIAGNOSIS — I130 Hypertensive heart and chronic kidney disease with heart failure and stage 1 through stage 4 chronic kidney disease, or unspecified chronic kidney disease: Secondary | ICD-10-CM | POA: Diagnosis present

## 2020-02-08 DIAGNOSIS — Z87891 Personal history of nicotine dependence: Secondary | ICD-10-CM

## 2020-02-08 DIAGNOSIS — N1831 Chronic kidney disease, stage 3a: Secondary | ICD-10-CM | POA: Diagnosis present

## 2020-02-08 DIAGNOSIS — Z79899 Other long term (current) drug therapy: Secondary | ICD-10-CM

## 2020-02-08 DIAGNOSIS — S32039A Unspecified fracture of third lumbar vertebra, initial encounter for closed fracture: Secondary | ICD-10-CM | POA: Diagnosis present

## 2020-02-08 DIAGNOSIS — G4700 Insomnia, unspecified: Secondary | ICD-10-CM | POA: Diagnosis present

## 2020-02-08 DIAGNOSIS — Z96612 Presence of left artificial shoulder joint: Secondary | ICD-10-CM | POA: Diagnosis present

## 2020-02-08 DIAGNOSIS — E785 Hyperlipidemia, unspecified: Secondary | ICD-10-CM | POA: Diagnosis present

## 2020-02-08 DIAGNOSIS — G929 Unspecified toxic encephalopathy: Secondary | ICD-10-CM | POA: Diagnosis present

## 2020-02-08 LAB — COMPREHENSIVE METABOLIC PANEL WITH GFR
ALT: 21 U/L (ref 0–44)
AST: 28 U/L (ref 15–41)
Albumin: 3.6 g/dL (ref 3.5–5.0)
Alkaline Phosphatase: 81 U/L (ref 38–126)
Anion gap: 14 (ref 5–15)
BUN: 25 mg/dL — ABNORMAL HIGH (ref 8–23)
CO2: 19 mmol/L — ABNORMAL LOW (ref 22–32)
Calcium: 9.1 mg/dL (ref 8.9–10.3)
Chloride: 108 mmol/L (ref 98–111)
Creatinine, Ser: 1.8 mg/dL — ABNORMAL HIGH (ref 0.44–1.00)
GFR, Estimated: 26 mL/min — ABNORMAL LOW
Glucose, Bld: 131 mg/dL — ABNORMAL HIGH (ref 70–99)
Potassium: 4.1 mmol/L (ref 3.5–5.1)
Sodium: 141 mmol/L (ref 135–145)
Total Bilirubin: 1.1 mg/dL (ref 0.3–1.2)
Total Protein: 7 g/dL (ref 6.5–8.1)

## 2020-02-08 LAB — CBC WITH DIFFERENTIAL/PLATELET
Abs Immature Granulocytes: 0.05 10*3/uL (ref 0.00–0.07)
Basophils Absolute: 0 10*3/uL (ref 0.0–0.1)
Basophils Relative: 0 %
Eosinophils Absolute: 0.2 10*3/uL (ref 0.0–0.5)
Eosinophils Relative: 2 %
HCT: 34 % — ABNORMAL LOW (ref 36.0–46.0)
Hemoglobin: 10.6 g/dL — ABNORMAL LOW (ref 12.0–15.0)
Immature Granulocytes: 0 %
Lymphocytes Relative: 47 %
Lymphs Abs: 5.3 10*3/uL — ABNORMAL HIGH (ref 0.7–4.0)
MCH: 25.4 pg — ABNORMAL LOW (ref 26.0–34.0)
MCHC: 31.2 g/dL (ref 30.0–36.0)
MCV: 81.3 fL (ref 80.0–100.0)
Monocytes Absolute: 1.1 10*3/uL — ABNORMAL HIGH (ref 0.1–1.0)
Monocytes Relative: 9 %
Neutro Abs: 4.8 10*3/uL (ref 1.7–7.7)
Neutrophils Relative %: 42 %
Platelets: 310 10*3/uL (ref 150–400)
RBC: 4.18 MIL/uL (ref 3.87–5.11)
RDW: 15.3 % (ref 11.5–15.5)
WBC: 11.4 10*3/uL — ABNORMAL HIGH (ref 4.0–10.5)
nRBC: 0 % (ref 0.0–0.2)

## 2020-02-08 LAB — LIPASE, BLOOD: Lipase: 29 U/L (ref 11–51)

## 2020-02-08 MED ORDER — FENTANYL CITRATE (PF) 100 MCG/2ML IJ SOLN
50.0000 ug | Freq: Once | INTRAMUSCULAR | Status: AC | PRN
  Administered 2020-02-08: 50 ug via INTRAVENOUS
  Filled 2020-02-08: qty 2

## 2020-02-08 MED ORDER — FENTANYL CITRATE (PF) 100 MCG/2ML IJ SOLN
50.0000 ug | Freq: Once | INTRAMUSCULAR | Status: AC
  Administered 2020-02-08: 50 ug via INTRAVENOUS
  Filled 2020-02-08: qty 2

## 2020-02-08 MED ORDER — ISOSORB DINITRATE-HYDRALAZINE 20-37.5 MG PO TABS
1.0000 | ORAL_TABLET | Freq: Three times a day (TID) | ORAL | Status: DC
  Filled 2020-02-08: qty 1

## 2020-02-08 MED ORDER — ISOSORB DINITRATE-HYDRALAZINE 20-37.5 MG PO TABS: 1.0000 | ORAL_TABLET | Freq: Three times a day (TID) | ORAL | 3 refills | Status: DC

## 2020-02-08 MED ORDER — METOPROLOL TARTRATE 25 MG PO TABS
75.0000 mg | ORAL_TABLET | Freq: Two times a day (BID) | ORAL | Status: DC
  Administered 2020-02-08: 25 mg via ORAL
  Filled 2020-02-08: qty 3

## 2020-02-08 NOTE — ED Triage Notes (Signed)
Patient fell last night in the bathroom hit her back no LOC states she is on a blood thinner but does not know. No injury to head. Patient complaining of lower back pain and spasms that is making it difficult to walk.

## 2020-02-08 NOTE — ED Provider Notes (Signed)
[facility] EMERGENCY DEPARTMENT Provider Note   CSN: [id number] Arrival date & time: 02/08/20  1839     History Chief Complaint  Patient presents with  . Fall    Jenna Murphy is a 86 y.o. female with a past medical history of CHF, peripheral artery disease, hypertension, HFpEF, DM, who presents today for evaluation after a fall.  She was discharged from the hospital yesterday after being admitted for shortness of breath.  She states that last night she was in the bathroom when she had a reported mechanical nonsyncopal fall when she tripped on her silk pajama pants.  She does not think she struck her head and does not take blood thinning medications, however reports that she landed on the side of the tub perpendicular to her back across the lower back.  She has been ambulatory since however reports significant pain that waxes and wanes.  She denies any pain in her bilateral arms or legs.  She states she has mild pain in the left-sided abdomen.  Her shortness of breath is at baseline and unchanged.  She denies any changes to bowel or bladder function.  No new weakness numbness or paresthesias in bilateral upper and lower extremities.  HPI     Past Medical History:  Diagnosis Date  . Arthritis   . Carpal tunnel syndrome of left wrist   . Diabetes mellitus    not on insulin  . Gallstone pancreatitis 02/2008  . Heart murmur   . Hypertension   . Polio    as child     Patient Active Problem List   Diagnosis Date Noted  . DOE (dyspnea on exertion) 02/06/2020  . Acute heart failure with preserved ejection fraction (HFpEF) (HCC)   . AKI (acute kidney injury) (HCC)   . Type 2 diabetes mellitus with hyperlipidemia (HCC)   . Type 2 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)   . CHF (congestive heart failure) (HCC) 02/04/2020  . Palpitations 05/28/2019  . Chronic heart failure with preserved ejection fraction (HCC) 05/28/2019  . Exertional dyspnea  05/27/2019  . PAD (peripheral artery disease) (HCC) 09/25/2018  . 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 Chest pain 05/22/2011  . Essential hypertension 05/22/2011    Past Surgical History:  Procedure Laterality Date  . CHOLECYSTECTOMY    . REVERSE SHOULDER ARTHROPLASTY Left 05/23/2014   Procedure: REVERSE SHOULDER ARTHROPLASTY;  Surgeon: Mable Paris, MD;  Location: [facility] OR;  Service: Orthopedics;  Laterality: Left;  . REVERSE SHOULDER ARTHROPLASTY Right 12/11/2015   Procedure: REVERSE SHOULDER ARTHROPLASTY;  Surgeon: Jones Broom, MD;  Location: [facility] OR;  Service: Orthopedics;  Laterality: Right;  Right reverse total shoulder arthroplasty  . STERIOD INJECTION Left 05/23/2014   Procedure: STEROID INJECTION THUMB;  Surgeon: Mable Paris, MD;  Location: [facility] OR;  Service: Orthopedics;  Laterality: Left;  Steroid injection left thumb     OB History   No obstetric history on file.     Family History  Problem Relation Age of Onset  . Heart attack Mother        Died of MI, dec at 77   . Cirrhosis Father        alcohol induced     Social History   Tobacco Use  . Smoking status: Former Smoker    Packs/day: 1.00    Years: 20.00    Pack years: 20.00    Types: Cigarettes    Quit date: 04/12/1968  Years since quitting: 51.8  . Smokeless tobacco: Never Used  Substance Use Topics  . Alcohol use: No    Alcohol/week: 0.0 standard drinks  . Drug use: No    Home Medications Prior to Admission medications   Medication Sig Start Date End Date Taking? Authorizing Provider  atorvastatin (LIPITOR) 20 MG tablet Take 20 mg by mouth daily.    [provider]  cholecalciferol (VITAMIN D3) 25 MCG (1000 UNIT) tablet Take 1,000 Units by mouth daily.    [provider]  isosorbide-hydrALAZINE (BIDIL) 20-37.5 MG tablet Take 1 tablet by mouth 3 (three) times daily. 02/08/20 03/09/20  Patwardhan, Anabel Bene, MD  metFORMIN (GLUCOPHAGE) 500 MG tablet  Take 1 tablet (500 mg total) by mouth 2 (two) times daily with a meal. Resume metformin on 02/09/2020 02/07/20   Edsel Petrin, DO  metoprolol tartrate (LOPRESSOR) 50 MG tablet TAKE 1 AND 1/2 TABLETS BY MOUTH TWICE DAILY Patient taking differently: TAKE 1 AND 1/2 TABLETS BY MOUTH TWICE DAILY 10/16/19   Patwardhan, Anabel Bene, MD  OXYGEN Inhale into the lungs.    [provider]  torsemide (DEMADEX) 20 MG tablet Take 1 tablet (20 mg total) by mouth every other day. 02/09/20 03/10/20  Mikhail, Nita Sells, DO  TOUJEO SOLOSTAR 300 UNIT/ML Solostar Pen Inject 30-35 Units into the skin in the morning and at bedtime. Inject 30 units in the morning and 35 units at night 09/04/19   [provider]    Allergies    Patient has no known allergies.  Review of Systems   Review of Systems  Constitutional: Negative for chills and fever.  Respiratory: Negative for cough, chest tightness and shortness of breath.   Cardiovascular: Negative for chest pain.  Gastrointestinal: Positive for abdominal pain. Negative for diarrhea, nausea and vomiting.  Genitourinary: Negative for dysuria.  Musculoskeletal: Positive for back pain. Negative for neck pain.  Neurological: Negative for weakness and headaches.  All other systems reviewed and are negative.   Physical Exam Updated Vital Signs BP (!) 167/99   Pulse 63   Temp 97.8 F (36.6 C) (Oral)   Resp (!) 26   Ht 5\' 4"  (1.626 m)   Wt 68.9 kg   SpO2 95%   BMI 26.09 kg/m   Physical Exam Vitals and nursing note reviewed.  Constitutional:      Appearance: She is well-developed.     Comments: Appears uncomfortable  HENT:     Head: Normocephalic and atraumatic.     Mouth/Throat:     Mouth: Mucous membranes are moist.  Eyes:     Conjunctiva/sclera: Conjunctivae normal.  Cardiovascular:     Rate and Rhythm: Normal rate and regular rhythm.     Pulses: Normal pulses.     Heart sounds: Normal heart sounds. No murmur heard.   Pulmonary:      Effort: Pulmonary effort is normal. No respiratory distress.     Breath sounds: Normal breath sounds. No rhonchi.  Chest:     Chest wall: No tenderness.  Abdominal:     Palpations: Abdomen is soft.     Tenderness: There is abdominal tenderness (Left sided abdomen). There is no guarding.  Musculoskeletal:     Cervical back: Normal range of motion and neck supple. No rigidity.     Right lower leg: No edema.     Left lower leg: No edema.     Comments: C/T/L-spine palpated.  There is midline tenderness to palpation without obvious step-offs or deformity over the lumbar back  diffusely.  Skin:    General: Skin is warm and dry.  Neurological:     Mental Status: She is alert. Mental status is at baseline.  Psychiatric:        Mood and Affect: Mood normal.        Behavior: Behavior normal.     ED Results / Procedures / Treatments   Labs (all labs ordered are listed, but only abnormal results are displayed) Labs Reviewed  CBC WITH DIFFERENTIAL/PLATELET - Abnormal; Notable for the following components:      Result Value   WBC 11.4 (*)    Hemoglobin 10.6 (*)    HCT 34.0 (*)    MCH 25.4 (*)    Lymphs Abs 5.3 (*)    Monocytes Absolute 1.1 (*)    All other components within normal limits  RESPIRATORY PANEL BY RT PCR (FLU A&B, COVID)  COMPREHENSIVE METABOLIC PANEL  LIPASE, BLOOD  URINALYSIS, ROUTINE W REFLEX MICROSCOPIC  SAMPLE TO BLOOD BANK    EKG EKG Interpretation  Date/Time:  Friday February 08 2020 18:50:18 EDT Ventricular Rate:  61 PR Interval:    QRS Duration: 135 QT Interval:  466 QTC Calculation: 470 R Axis:   -71 Text Interpretation: Sinus rhythm RBBB and LAFB Left ventricular hypertrophy No significant change since 02/07/2020 Confirmed by Geoffery Lyons ([id number]) on 02/08/2020 7:02:48 PM   Radiology CT Abdomen Pelvis Wo Contrast  Result Date: 02/08/2020 CLINICAL DATA:  Trauma, fall, struck back on wall of bathtub, left abdominal pain and low back pain EXAM: CT  ABDOMEN AND PELVIS WITHOUT CONTRAST TECHNIQUE: Multidetector CT imaging of the abdomen and pelvis was performed following the standard protocol without IV contrast. COMPARISON:  Contemporary lumbar spine reconstructions. FINDINGS: Lower chest: Mid subpleural reticular opacities present in both lung bases could reflect some interstitial reticular change or atelectatic features similar to comparison CT 1 day prior. Some mild basilar bronchiectatic features. No consolidation or pleural effusion. Cardiomegaly. Hypoattenuation of the cardiac blood pool may reflect some mild anemia. Three-vessel coronary artery atherosclerosis as well as calcifications upon the aortic leaflets. No pericardial effusion. Distal thoracic aortic atherosclerosis. Hepatobiliary: No direct liver injury or perihepatic hemorrhage is seen. No worrisome focal liver lesions within the limitations of this unenhanced CT. Smooth liver surface contour. Normal liver attenuation. Prior cholecystectomy. No significant biliary dilatation or visible intraductal gallstones. Pancreas: No definite pancreatic contusion or ductal disruption multiple air lucencies about the head of the pancreas correspond to extensive duodenal diverticular disease including several larger air and fluid-filled diverticula present on comparison as remote as 2009. Moderate pancreatic atrophy. No focal peripancreatic inflammation or ductal dilatation. Spleen: No direct splenic injury or perisplenic hematoma. Slightly diminutive size of the spleen with likely vascular calcification. No concerning splenic lesion. Adrenals/Urinary Tract: No suspicious adrenal lesion or dominant nodule nor evidence of adrenal hemorrhage. Some mild lobular thickening can reflect some senescent adrenal hyperplasia. Mild symmetric bilateral perinephric stranding, a nonspecific finding which may correlate with advanced age or decreased renal function. Few capsular calcifications seen in the inferior pole right  kidney may reflect post infectious or inflammatory change. No concerning focal renal lesion. No urolithiasis or hydronephrosis. Slightly increased attenuation of the urinary bladder may reflect recent contrast administration for angiography performed 02/06/2020. Urinary bladder is otherwise unremarkable. Stomach/Bowel: Distal esophagus and stomach are unremarkable. Extensive duodenal diverticulosis with larger air and fluid-filled diverticula adjacent the pancreatic head and uncinate are similar to comparison. Duodenum with a normal sweep across the midline abdomen. No small bowel wall  thickening or dilatation. A normal appendix is visualized. No colonic dilatation or wall thickening. Scattered colonic diverticula without focal inflammation to suggest diverticulitis. No evidence of obstruction. No evidence of mesenteric hematoma or contusion. Vascular/Lymphatic: Atherosclerotic calcifications within the abdominal aorta and branch vessels. No aneurysm or ectasia. No enlarged abdominopelvic lymph nodes. Reproductive: Anteverted uterus with parametrial calcification and atrophic appearance of the ovaries is age-appropriate. No concerning uterine or adnexal lesions. Other: No abdominopelvic free air or fluid. No large body wall hematoma. Subcutaneous nodularity along the low anterior are abdominal wall on the left likely related to injectable use. Posterior body wall edema. No bowel containing hernia or traumatic abdominal wall dehiscence. Musculoskeletal: Dedicated lumbar reconstructions generated and dictated separately, please see report for further details. In brief, there are displaced left L1 through L4 transverse process fractures. Additionally, there minimally displaced fractures of posterolateral left tenth and eleventh ribs. No other acute traumatic osseous injury of the abdomen or pelvis. Bony pelvis is intact. Femoral heads are normally located. Multilevel discogenic and facet degenerative changes as well as  degenerative changes of the SI joints and bilateral hips. No acute abnormality of the partially included right hand. IMPRESSION: 1. Unenhanced CT was performed per clinician order. Lack of IV contrast limits sensitivity and specificity, especially for evaluation of abdominal/pelvic solid viscera. 2. Displaced left L1 through L4 transverse process fractures. For further details of the lumbar spine, please see dedicated reconstructions which were generated from this exam and dictated separately. 3. Minimally displaced fractures of posterolateral left tenth and eleventh ribs. 4. No other acute traumatic osseous injury of the abdomen or pelvis. No solid organ, hollow viscus or vascular injury is evident. 5. Mild symmetric bilateral perinephric stranding, a nonspecific finding which may correlate with advanced age or decreased renal function. If urinary symptoms are present, consider urinalysis to exclude infection. 6. Extensive duodenal diverticulosis with larger air and fluid-filled diverticula adjacent the pancreatic head and uncinate are similar to comparison. Colonic diverticulosis without evidence of diverticulitis. 7. Increased attenuation of the bladder contents likely related to intravenous contrast administration 02/06/2020. 8. Cardiomegaly with coronary artery atherosclerosis. 9. Hypoattenuation of the cardiac blood pool may reflect some mild anemia. 10. Subpleural reticular changes in the lung bases may reflect atelectasis or interstitial fibrosis. 11. Aortic Atherosclerosis (ICD10-I70.0). Electronically Signed   By: Kreg ShropshirePrice  DeHay M.D.   On: 02/08/2020 21:57   DG Chest 2 View  Result Date: 02/07/2020 CLINICAL DATA:  Shortness of breath EXAM: CHEST - 2 VIEW COMPARISON:  02/04/2020 FINDINGS: Unchanged appearance of the chest with mild interstitial pulmonary edema and mild cardiomegaly. No focal airspace consolidation. No pleural effusion. IMPRESSION: Unchanged appearance of mild interstitial pulmonary  edema. Electronically Signed   By: Deatra RobinsonKevin  Herman M.D.   On: 02/07/2020 20:12   DG Thoracic Spine 2 View  Result Date: 02/08/2020 CLINICAL DATA:  Fall, back pain, known transverse process fractures in the lumbar spine. EXAM: THORACIC SPINE 2 VIEWS COMPARISON:  Same-day CT abdomen and pelvis, CT L-spine. CT angiography of the chest 02/06/2020, radiograph 09/01/2012 FINDINGS: The osseous structures appear diffusely demineralized which may limit detection of small or nondisplaced fractures. Some slight anterior lower thoracic wedging about the T8, T9 levels are compatible with the progressive discogenic and facet degenerative changes seen on recent CT angiography of the chest when compared to more remote comparison radiography. Known left lumbar transverse process fractures and rib fractures are poorly visualized on this exam. Cholecystectomy clips present the right upper quadrant. Some coarsened interstitial changes are  present in the lungs could reflect some mild edema as seen on comparison CT angiography chest. IMPRESSION: 1. Slight anterior thoracic wedging about the T8, T9 levels are compatible with the progressive discogenic and facet degenerative changes seen on recent CT angiography of the chest. However could correlate for point tenderness and if there is persisting concern, CT or MR imaging could be obtained to assess for interval injury given demineralization limiting detection of subtle fractures. 2. Known left lumbar transverse process fractures and rib fractures are poorly visualized on this exam. Electronically Signed   By: Kreg Shropshire M.D.   On: 02/08/2020 22:37   CT Head Wo Contrast  Result Date: 02/08/2020 CLINICAL DATA:  Fall last night landing on back, no direct head injury. Lower back pain and spasms. On anticoagulation EXAM: CT HEAD WITHOUT CONTRAST CT CERVICAL SPINE WITHOUT CONTRAST TECHNIQUE: Multidetector CT imaging of the head and cervical spine was performed following the standard  protocol without intravenous contrast. Multiplanar CT image reconstructions of the cervical spine were also generated. COMPARISON:  CT head 04/18/2004, CT cervical spine 11/17/2015 FINDINGS: CT HEAD FINDINGS Brain: Few punctate scattered hypoattenuating foci in the bilateral basal ganglia may reflect areas of prior lacunar type infarct. Mineralization of the bilateral basal ganglia as well, often a benign senescent finding. No evidence of acute infarction, hemorrhage, hydrocephalus, extra-axial collection, visible mass lesion or mass effect. Symmetric prominence of the ventricles, cisterns and sulci compatible with parenchymal volume loss. Patchy areas of white matter hypoattenuation are most compatible with chronic microvascular angiopathy. Vascular: Atherosclerotic calcification of the carotid siphons and intradural vertebral arteries. No hyperdense vessel. Left dominant vertebrobasilar system. Skull: No significant scalp swelling or hematoma. No calvarial fracture or acute osseous abnormality. Sinuses/Orbits: Minimal thickening in the ethmoids. Remaining paranasal sinuses and mastoid air cells are predominantly clear. No layering air-fluid levels or pneumatized secretions. Orbital structures are unremarkable aside from prior lens extractions. Other: Moderate bilateral TMJ arthrosis. Edentulous with some mild mandibular prognathism. CT CERVICAL SPINE FINDINGS Alignment: A stabilization collar is absent at the time of examination. There is straightening of the normal cervical lordosis. Slight stepwise retrolisthesis is present C3-C6 and with mild retrolisthesis C6 on C7 and 2 mm anterolisthesis C7 on T1. These findings are similar to comparison radiography and favored to be on a degenerative basis with multilevel spondylitic changes. No evidence of traumatic listhesis. No abnormally widened, perched or jumped facets. Normal alignment of the craniocervical and atlantoaxial articulations. Skull base and vertebrae: No  acute skull base fracture. No vertebral body fracture or height loss. The osseous structures appear diffusely demineralized which may limit detection of small or nondisplaced fractures. Moderate arthrosis at the atlantodental and basion dens interval with degenerative spurring. Multilevel cervical spondylitic changes are present as well, better detailed below. Soft tissues and spinal canal: No pre or paravertebral fluid or swelling. No visible canal hematoma. Disc levels: Multilevel intervertebral disc height loss with spondylitic endplate changes. Slightly larger disc osteophyte complexes present at C6-7 which in combination with some posterior ligamentum flavum infolding in on left facet hypertrophic change result in moderate canal stenosis at this level. No other significant canal narrowing. Multilevel uncinate spurring and facet hypertrophic changes elsewhere result in mild-to-moderate multilevel neural foraminal narrowing. Upper chest: No acute abnormality in the upper chest or imaged lung apices. Other: Atherosclerotic calcifications seen in the proximal great vessels. Cervical carotid atherosclerosis is present as well. Normal thyroid. IMPRESSION: 1. No acute intracranial abnormality. No significant scalp swelling or calvarial fracture. 2. Chronic microvascular  ischemic changes, parenchymal volume loss and intracranial atherosclerosis. 3. No evidence of acute fracture or traumatic listhesis of the cervical spine. 4. Multilevel degenerative changes of the cervical spine, as described above. Features maximal at C6-7 with at most moderate canal stenosis and multilevel mild-to-moderate neural foraminal narrowing. Electronically Signed   By: Kreg Shropshire M.D.   On: 02/08/2020 21:34   CT Cervical Spine Wo Contrast  Result Date: 02/08/2020 CLINICAL DATA:  Fall last night landing on back, no direct head injury. Lower back pain and spasms. On anticoagulation EXAM: CT HEAD WITHOUT CONTRAST CT CERVICAL SPINE  WITHOUT CONTRAST TECHNIQUE: Multidetector CT imaging of the head and cervical spine was performed following the standard protocol without intravenous contrast. Multiplanar CT image reconstructions of the cervical spine were also generated. COMPARISON:  CT head 04/18/2004, CT cervical spine 11/17/2015 FINDINGS: CT HEAD FINDINGS Brain: Few punctate scattered hypoattenuating foci in the bilateral basal ganglia may reflect areas of prior lacunar type infarct. Mineralization of the bilateral basal ganglia as well, often a benign senescent finding. No evidence of acute infarction, hemorrhage, hydrocephalus, extra-axial collection, visible mass lesion or mass effect. Symmetric prominence of the ventricles, cisterns and sulci compatible with parenchymal volume loss. Patchy areas of white matter hypoattenuation are most compatible with chronic microvascular angiopathy. Vascular: Atherosclerotic calcification of the carotid siphons and intradural vertebral arteries. No hyperdense vessel. Left dominant vertebrobasilar system. Skull: No significant scalp swelling or hematoma. No calvarial fracture or acute osseous abnormality. Sinuses/Orbits: Minimal thickening in the ethmoids. Remaining paranasal sinuses and mastoid air cells are predominantly clear. No layering air-fluid levels or pneumatized secretions. Orbital structures are unremarkable aside from prior lens extractions. Other: Moderate bilateral TMJ arthrosis. Edentulous with some mild mandibular prognathism. CT CERVICAL SPINE FINDINGS Alignment: A stabilization collar is absent at the time of examination. There is straightening of the normal cervical lordosis. Slight stepwise retrolisthesis is present C3-C6 and with mild retrolisthesis C6 on C7 and 2 mm anterolisthesis C7 on T1. These findings are similar to comparison radiography and favored to be on a degenerative basis with multilevel spondylitic changes. No evidence of traumatic listhesis. No abnormally widened,  perched or jumped facets. Normal alignment of the craniocervical and atlantoaxial articulations. Skull base and vertebrae: No acute skull base fracture. No vertebral body fracture or height loss. The osseous structures appear diffusely demineralized which may limit detection of small or nondisplaced fractures. Moderate arthrosis at the atlantodental and basion dens interval with degenerative spurring. Multilevel cervical spondylitic changes are present as well, better detailed below. Soft tissues and spinal canal: No pre or paravertebral fluid or swelling. No visible canal hematoma. Disc levels: Multilevel intervertebral disc height loss with spondylitic endplate changes. Slightly larger disc osteophyte complexes present at C6-7 which in combination with some posterior ligamentum flavum infolding in on left facet hypertrophic change result in moderate canal stenosis at this level. No other significant canal narrowing. Multilevel uncinate spurring and facet hypertrophic changes elsewhere result in mild-to-moderate multilevel neural foraminal narrowing. Upper chest: No acute abnormality in the upper chest or imaged lung apices. Other: Atherosclerotic calcifications seen in the proximal great vessels. Cervical carotid atherosclerosis is present as well. Normal thyroid. IMPRESSION: 1. No acute intracranial abnormality. No significant scalp swelling or calvarial fracture. 2. Chronic microvascular ischemic changes, parenchymal volume loss and intracranial atherosclerosis. 3. No evidence of acute fracture or traumatic listhesis of the cervical spine. 4. Multilevel degenerative changes of the cervical spine, as described above. Features maximal at C6-7 with at most moderate canal stenosis and  multilevel mild-to-moderate neural foraminal narrowing. Electronically Signed   By: Kreg Shropshire M.D.   On: 02/08/2020 21:34   CT L-SPINE NO CHARGE  Result Date: 02/08/2020 CLINICAL DATA:  Fall, struck back with low back pain  EXAM: CT LUMBAR SPINE WITHOUT CONTRAST TECHNIQUE: Multiplanar reconstructions of the lumbar spine were generated from the contemporary CT of the abdomen and pelvis. COMPARISON:  Contemporary CT abdomen and pelvis. FINDINGS: Segmentation: 5 normally formed lumbar type vertebral bodies. Lowest fully formed disc space denoted as L5-S1. Alignment: Mild straightening of normal lumbar lordosis. Minimal retrolisthesis of L4 on L5 of approximately 3 mm. Favored to be on a facet degenerative basis without spondylolysis. No abnormally widened, jumped or perched facets. Vertebrae: No acute vertebral body fracture or height loss is seen. There are displaced fractures of the left L1 through L4 transverse processes. No other acute fracture or traumatic osseous injury is seen in the lumbar spine or included portions of the bony pelvis. 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 Multilevel discogenic and facet degenerative changes are present, detailed below level by level. 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 Additional degenerative changes in the bilateral SI joints and hips. Paraspinal and other soft tissues: Minimal soft tissue thickening seen adjacent the left L1-L4 transverse process fractures. No other acute paravertebral fluid, swelling, gas or hemorrhage. No visible canal hematoma within the limitations of this exam. Posterior body wall edema. For findings in the posterior abdomen and pelvis, please see dedicated CT from which this study is reconstructed. Disc levels: Level by level evaluation of the lumbar spine below: T11-T12: Disc desiccation and height loss with small global disc bulge and moderate facet arthropathy, left greater than right with severe left and moderate right neural foraminal narrowing. T12-L1: Mild disc height loss and bilateral facet arthropathy. Shallow global disc bulge. No  significant spinal canal or foraminal stenosis. L1-L2: Moderate to severe disc height loss with desiccation and Schmorl's node formations and mild bilateral facet arthropathy. No significant canal stenosis. Mild left and moderate right foraminal narrowing. L2-L3: Severe disc height loss, desiccation and vacuum disc with moderate bilateral facet arthropathy, posterior ridging and global disc bulge. Moderate canal stenosis and mild bilateral foraminal narrowing. L3-L4: Mild disc height loss with desiccation, Schmorl's node formation and vacuum disc phenomenon. Mild bilateral facet arthropathy. Shallow global disc bulge and ligamentum flavum infolding with at most mild canal stenosis but no significant neural foraminal narrowing. L4-L5: Retrolisthesis, mild disc height loss and moderate bilateral facet arthropathy, with shallow global disc bulge and some mild ligamentum flavum infolding at most mild canal stenosis but with moderate to severe bilateral foraminal narrowing. L5-S1: Severe disc height loss, desiccation and vacuum disc as well as severe bilateral facet arthropathy. Shallow global disc bulge. No significant canal stenosis. Moderate to severe bilateral neural foraminal narrowing. IMPRESSION: 1. Displaced fractures of the left L1 through L4 transverse processes. 2. No other acute fracture or traumatic osseous injury is seen in the lumbar spine or included portions of the bony pelvis. 3. Diffuse bony demineralization may limit detection of subtle nondisplaced fractures. 4. Multilevel discogenic and facet degenerative changes as described above, with moderate canal stenosis at L2-L3 and mild canal stenosis at L3-L4 and L4-L5. Multilevel moderate to severe neural foraminal narrowing is present as well. 5. For findings in the posterior abdomen and pelvis, please see dedicated CT from which this study is reconstructed. Electronically Signed   By: Samuella Cota  [facility] M.D.   On: 02/08/2020 21:44    Procedures Procedures  (including critical care time)  Medications Ordered in ED Medications  fentaNYL (SUBLIMAZE) injection 50 mcg (50 mcg Intravenous Given 02/08/20 2025)  fentaNYL (SUBLIMAZE) injection 50 mcg (50 mcg Intravenous Given 02/08/20 2258)    ED Course  I have reviewed the triage vital signs and the nursing notes.  Pertinent labs & imaging results that were available during my care of the patient were reviewed by me and considered in my medical decision making (see chart for details).  Clinical Course as of Feb 07 2350  Fri Feb 08, 2020  2140 No acute intracranial abnormalities, chronic age-related vascular changes.  No evidence of C-spine fracture.  CT Head Wo Contrast [EH]    Clinical Course User Index [EH] Norman Clay   MDM Rules/Calculators/A&P                         Patient is a 86 year old woman who presents today for evaluation of a fall that occurred last.  She was in the bathroom when she had a reported mechanical nonsyncopal fall and she landed on the edge of the tub in her mid to lower back.  Since then she has been ambulatory but has waxing and waning pain.  CT head and neck obtained without acute abnormalities.  She had abdominal tenderness on exam therefore CT abdomen with L-spine recon is obtained.  CT abdomen shows minimally displaced fractures of the left posterior lateral 10th and 11th ribs.  The L-spine reconstitution show spinous fractures of L1-L4 transverse process.  Given the rib fractures seen on CT abdomen, along with the abnormal wedging of thoracic vertebrae seen on T-spine x-rays CT chest is ordered along with T spine recon.  These studies were all initially ordered without contrast due to her history of GFR arrived at 30.  She did recently have a contrasted scan, however initially as this was mechanical nonsyncopal had not plan to obtain labs.    Given that patient will require admission for pain control lab orders are placed.   Fentanyl was ordered to  help control her pain.    I did speak with Dr. Dwain Sarna, on-call for Trauma surgery who recommended medical admission assuming that CT chest does not show significant traumatic injuries other than the known 2 broken ribs.  At shift change care was transferred to Sharilyn Sites PA-C who will follow pending studies, re-evaulate and determine disposition.    Note: Portions of this report may have been transcribed using voice recognition software. Every effort was made to ensure accuracy; however, inadvertent computerized transcription errors may be present  Final Clinical Impression(s) / ED Diagnoses Final diagnoses:  Back pain  Closed fracture of transverse process of lumbar vertebra, initial encounter [facility])  Closed fracture of multiple ribs of left side, initial encounter    Rx / DC Orders ED Discharge Orders    None       Cristina Gong, PA-C 02/08/20 2352    Geoffery Lyons, MD 02/08/20 2357

## 2020-02-09 ENCOUNTER — Emergency Department (HOSPITAL_COMMUNITY): Payer: Medicare Other

## 2020-02-09 DIAGNOSIS — S32029A Unspecified fracture of second lumbar vertebra, initial encounter for closed fracture: Secondary | ICD-10-CM | POA: Diagnosis present

## 2020-02-09 DIAGNOSIS — S2242XA Multiple fractures of ribs, left side, initial encounter for closed fracture: Secondary | ICD-10-CM | POA: Diagnosis present

## 2020-02-09 DIAGNOSIS — S32009A Unspecified fracture of unspecified lumbar vertebra, initial encounter for closed fracture: Secondary | ICD-10-CM

## 2020-02-09 DIAGNOSIS — Z20822 Contact with and (suspected) exposure to covid-19: Secondary | ICD-10-CM | POA: Diagnosis present

## 2020-02-09 DIAGNOSIS — N179 Acute kidney failure, unspecified: Secondary | ICD-10-CM | POA: Diagnosis present

## 2020-02-09 DIAGNOSIS — R911 Solitary pulmonary nodule: Secondary | ICD-10-CM | POA: Diagnosis present

## 2020-02-09 DIAGNOSIS — Z9049 Acquired absence of other specified parts of digestive tract: Secondary | ICD-10-CM | POA: Diagnosis not present

## 2020-02-09 DIAGNOSIS — E1122 Type 2 diabetes mellitus with diabetic chronic kidney disease: Secondary | ICD-10-CM | POA: Diagnosis present

## 2020-02-09 DIAGNOSIS — Z794 Long term (current) use of insulin: Secondary | ICD-10-CM | POA: Diagnosis not present

## 2020-02-09 DIAGNOSIS — I5032 Chronic diastolic (congestive) heart failure: Secondary | ICD-10-CM | POA: Diagnosis present

## 2020-02-09 DIAGNOSIS — Y92002 Bathroom of unspecified non-institutional (private) residence single-family (private) house as the place of occurrence of the external cause: Secondary | ICD-10-CM | POA: Diagnosis not present

## 2020-02-09 DIAGNOSIS — Z87891 Personal history of nicotine dependence: Secondary | ICD-10-CM | POA: Diagnosis not present

## 2020-02-09 DIAGNOSIS — Z8249 Family history of ischemic heart disease and other diseases of the circulatory system: Secondary | ICD-10-CM | POA: Diagnosis not present

## 2020-02-09 DIAGNOSIS — G929 Unspecified toxic encephalopathy: Secondary | ICD-10-CM | POA: Diagnosis present

## 2020-02-09 DIAGNOSIS — G4700 Insomnia, unspecified: Secondary | ICD-10-CM | POA: Diagnosis present

## 2020-02-09 DIAGNOSIS — Y93E1 Activity, personal bathing and showering: Secondary | ICD-10-CM | POA: Diagnosis not present

## 2020-02-09 DIAGNOSIS — M549 Dorsalgia, unspecified: Secondary | ICD-10-CM | POA: Diagnosis present

## 2020-02-09 DIAGNOSIS — S32019A Unspecified fracture of first lumbar vertebra, initial encounter for closed fracture: Secondary | ICD-10-CM | POA: Diagnosis present

## 2020-02-09 DIAGNOSIS — E785 Hyperlipidemia, unspecified: Secondary | ICD-10-CM | POA: Diagnosis present

## 2020-02-09 DIAGNOSIS — S32049A Unspecified fracture of fourth lumbar vertebra, initial encounter for closed fracture: Secondary | ICD-10-CM | POA: Diagnosis present

## 2020-02-09 DIAGNOSIS — W19XXXA Unspecified fall, initial encounter: Secondary | ICD-10-CM

## 2020-02-09 DIAGNOSIS — W182XXA Fall in (into) shower or empty bathtub, initial encounter: Secondary | ICD-10-CM | POA: Diagnosis present

## 2020-02-09 DIAGNOSIS — Z79899 Other long term (current) drug therapy: Secondary | ICD-10-CM | POA: Diagnosis not present

## 2020-02-09 DIAGNOSIS — Z96612 Presence of left artificial shoulder joint: Secondary | ICD-10-CM | POA: Diagnosis present

## 2020-02-09 DIAGNOSIS — S32039A Unspecified fracture of third lumbar vertebra, initial encounter for closed fracture: Secondary | ICD-10-CM | POA: Diagnosis present

## 2020-02-09 DIAGNOSIS — Z9981 Dependence on supplemental oxygen: Secondary | ICD-10-CM | POA: Diagnosis not present

## 2020-02-09 DIAGNOSIS — Z7984 Long term (current) use of oral hypoglycemic drugs: Secondary | ICD-10-CM | POA: Diagnosis not present

## 2020-02-09 DIAGNOSIS — N1831 Chronic kidney disease, stage 3a: Secondary | ICD-10-CM | POA: Diagnosis present

## 2020-02-09 DIAGNOSIS — I130 Hypertensive heart and chronic kidney disease with heart failure and stage 1 through stage 4 chronic kidney disease, or unspecified chronic kidney disease: Secondary | ICD-10-CM | POA: Diagnosis present

## 2020-02-09 DIAGNOSIS — I5033 Acute on chronic diastolic (congestive) heart failure: Secondary | ICD-10-CM | POA: Diagnosis not present

## 2020-02-09 LAB — CBC
HCT: 34 % — ABNORMAL LOW (ref 36.0–46.0)
Hemoglobin: 10.7 g/dL — ABNORMAL LOW (ref 12.0–15.0)
MCH: 25.3 pg — ABNORMAL LOW (ref 26.0–34.0)
MCHC: 31.5 g/dL (ref 30.0–36.0)
MCV: 80.4 fL (ref 80.0–100.0)
Platelets: 293 10*3/uL (ref 150–400)
RBC: 4.23 MIL/uL (ref 3.87–5.11)
RDW: 15.1 % (ref 11.5–15.5)
WBC: 9.3 10*3/uL (ref 4.0–10.5)
nRBC: 0 % (ref 0.0–0.2)

## 2020-02-09 LAB — RENAL FUNCTION PANEL
Albumin: 3.4 g/dL — ABNORMAL LOW (ref 3.5–5.0)
Anion gap: 13 (ref 5–15)
BUN: 19 mg/dL (ref 8–23)
CO2: 18 mmol/L — ABNORMAL LOW (ref 22–32)
Calcium: 9 mg/dL (ref 8.9–10.3)
Chloride: 107 mmol/L (ref 98–111)
Creatinine, Ser: 1.41 mg/dL — ABNORMAL HIGH (ref 0.44–1.00)
GFR, Estimated: 35 mL/min — ABNORMAL LOW (ref >60)
Glucose, Bld: 134 mg/dL — ABNORMAL HIGH (ref 70–99)
Phosphorus: 3.4 mg/dL (ref 2.5–4.6)
Potassium: 3.8 mmol/L (ref 3.5–5.1)
Sodium: 138 mmol/L (ref 135–145)

## 2020-02-09 LAB — MAGNESIUM: Magnesium: 1.9 mg/dL (ref 1.7–2.4)

## 2020-02-09 LAB — GLUCOSE, CAPILLARY
Glucose-Capillary: 117 mg/dL — ABNORMAL HIGH (ref 70–99)
Glucose-Capillary: 166 mg/dL — ABNORMAL HIGH (ref 70–99)
Glucose-Capillary: 111 mg/dL — ABNORMAL HIGH (ref 70–99)
Glucose-Capillary: 151 mg/dL — ABNORMAL HIGH (ref 70–99)

## 2020-02-09 LAB — RESPIRATORY PANEL BY RT PCR (FLU A&B, COVID)
Influenza A by PCR: NEGATIVE
Influenza B by PCR: NEGATIVE
SARS Coronavirus 2 by RT PCR: NEGATIVE

## 2020-02-09 LAB — HEMOGLOBIN A1C
Hgb A1c MFr Bld: 7.6 % — ABNORMAL HIGH (ref 4.8–5.6)
Mean Plasma Glucose: 171.42 mg/dL

## 2020-02-09 MED ORDER — ONDANSETRON HCL 4 MG/2ML IJ SOLN
4.0000 mg | Freq: Three times a day (TID) | INTRAMUSCULAR | Status: DC | PRN
  Filled 2020-02-09: qty 2

## 2020-02-09 MED ORDER — INSULIN GLARGINE 100 UNIT/ML ~~LOC~~ SOLN
35.0000 [IU] | Freq: Every day | SUBCUTANEOUS | Status: DC
  Filled 2020-02-09: qty 0.35

## 2020-02-09 MED ORDER — MORPHINE SULFATE (PF) 2 MG/ML IV SOLN
2.0000 mg | INTRAVENOUS | Status: DC | PRN
  Administered 2020-02-09: 2 mg via INTRAVENOUS
  Filled 2020-02-09: qty 1

## 2020-02-09 MED ORDER — POLYETHYLENE GLYCOL 3350 17 G PO PACK: 17.0000 g | PACK | Freq: Two times a day (BID) | ORAL | Status: DC | PRN

## 2020-02-09 MED ORDER — ACETAMINOPHEN 325 MG PO TABS: 650.0000 mg | ORAL_TABLET | Freq: Four times a day (QID) | ORAL | Status: DC | PRN

## 2020-02-09 MED ORDER — INSULIN ASPART 100 UNIT/ML ~~LOC~~ SOLN
0.0000 [IU] | Freq: Three times a day (TID) | SUBCUTANEOUS | Status: DC
  Administered 2020-02-09: 3 [IU] via SUBCUTANEOUS
  Administered 2020-02-10: 2 [IU] via SUBCUTANEOUS

## 2020-02-09 MED ORDER — ATORVASTATIN CALCIUM 10 MG PO TABS
20.0000 mg | ORAL_TABLET | Freq: Every day | ORAL | Status: DC
  Administered 2020-02-09 – 2020-02-12 (×4): 20 mg via ORAL
  Filled 2020-02-09 (×4): qty 2

## 2020-02-09 MED ORDER — BACLOFEN 10 MG PO TABS
10.0000 mg | ORAL_TABLET | Freq: Three times a day (TID) | ORAL | Status: DC
  Administered 2020-02-09 (×3): 10 mg via ORAL
  Filled 2020-02-09 (×4): qty 1

## 2020-02-09 MED ORDER — SENNOSIDES-DOCUSATE SODIUM 8.6-50 MG PO TABS: 1.0000 | ORAL_TABLET | Freq: Two times a day (BID) | ORAL | Status: DC | PRN

## 2020-02-09 MED ORDER — ACETAMINOPHEN 500 MG PO TABS
1000.0000 mg | ORAL_TABLET | Freq: Three times a day (TID) | ORAL | Status: DC
  Administered 2020-02-09 – 2020-02-13 (×12): 1000 mg via ORAL
  Filled 2020-02-09 (×13): qty 2

## 2020-02-09 MED ORDER — METOPROLOL TARTRATE 50 MG PO TABS
75.0000 mg | ORAL_TABLET | Freq: Two times a day (BID) | ORAL | Status: DC
  Administered 2020-02-09 – 2020-02-13 (×8): 75 mg via ORAL
  Filled 2020-02-09 (×11): qty 1

## 2020-02-09 MED ORDER — LIDOCAINE 5 % EX PTCH
1.0000 | MEDICATED_PATCH | CUTANEOUS | Status: DC
  Administered 2020-02-09 – 2020-02-12 (×4): 1 via TRANSDERMAL
  Filled 2020-02-09 (×4): qty 1

## 2020-02-09 MED ORDER — ACETAMINOPHEN 650 MG RE SUPP: 650.0000 mg | Freq: Four times a day (QID) | RECTAL | Status: DC | PRN

## 2020-02-09 MED ORDER — OXYCODONE HCL 5 MG PO TABS: 5.0000 mg | ORAL_TABLET | Freq: Three times a day (TID) | ORAL | Status: DC | PRN

## 2020-02-09 MED ORDER — ISOSORB DINITRATE-HYDRALAZINE 20-37.5 MG PO TABS
1.0000 | ORAL_TABLET | Freq: Three times a day (TID) | ORAL | Status: DC
  Administered 2020-02-09 – 2020-02-13 (×12): 1 via ORAL
  Filled 2020-02-09 (×17): qty 1

## 2020-02-09 MED ORDER — INSULIN GLARGINE 100 UNIT/ML ~~LOC~~ SOLN
30.0000 [IU] | Freq: Every day | SUBCUTANEOUS | Status: DC
  Administered 2020-02-09: 30 [IU] via SUBCUTANEOUS
  Filled 2020-02-09 (×3): qty 0.3

## 2020-02-09 MED ORDER — INSULIN GLARGINE 100 UNIT/ML ~~LOC~~ SOLN
20.0000 [IU] | Freq: Two times a day (BID) | SUBCUTANEOUS | Status: DC
  Administered 2020-02-09: 20 [IU] via SUBCUTANEOUS
  Filled 2020-02-09 (×3): qty 0.2

## 2020-02-09 MED ORDER — MORPHINE SULFATE (PF) 2 MG/ML IV SOLN
1.0000 mg | INTRAVENOUS | Status: DC | PRN
  Administered 2020-02-09 (×2): 1 mg via INTRAVENOUS
  Filled 2020-02-09 (×2): qty 1

## 2020-02-09 NOTE — Progress Notes (Signed)
Spoke to [place], pt's daughter-in-law who is concerned about pt's confusion today. I told Rhea that I just gave pt morphine for pain. Patient seems relaxed now. Rhea  stated pt gets confused and have hallucinations when taking oxycodone. Informed the daughter-in-law that pt has not had any oxycodone since admitted to this unit. RN will continue to monitor.

## 2020-02-09 NOTE — H&P (Signed)
History and Physical    Jenna Murphy TDH:[id number] DOB: 01/24/1928 DOA: 02/08/2020  PCP: Jenna Devon, MD Patient coming from: Home  Chief Complaint: Fall  HPI: Jenna Murphy is a 86 y.o. female with medical history significant of noninsulin-dependent type 2 diabetes, gallstone pancreatitis, hypertension, hyperlipidemia, chronic diastolic CHF, CKD stage IIIa presenting to the ED after a fall.  Patient was discharged from the hospital on 02/07/2020 after being treated for CHF exacerbation and AKI.  Patient states she was in the bathroom wearing silk pajamas.  Somehow her foot slipped on the fabric and she fell to the side of the tub and injured her back and left side.  Since the fall she is having a lot of pain in her left lower posterior rib region and lower back.  Denies loss of consciousness.  Denies any other injuries from the fall.  Denies dizziness or chest pain.  Reports having chronic shortness of breath, no recent change.  Reports poor appetite.  No nausea, vomiting, or diarrhea.  ED Course: Hemodynamically stable.  WBC 11.4, hemoglobin 10.6 (no significant change from baseline), hematocrit 34.0, platelet 310k.  Sodium 141, potassium 4.1, chloride 108, bicarb 19, anion gap 14, BUN 25, creatinine 1.8, glucose 131.  SARS-CoV-2 PCR test negative.  Influenza panel negative.  CT lumbar spine showing displaced left L1-L4 transverse process fractures.  CT abdomen pelvis showing minimally displaced fractures of the posterolateral left 10th and 11th ribs.  Patient subsequently had CT chest done which did not show any acute abnormality.  CT abdomen pelvis negative for findings consistent with acute traumatic injury.  CT head negative for acute intracranial abnormality.  CT C-spine negative for findings consistent with acute traumatic injury.  CT thoracic spine negative for acute abnormality.  Case was discussed with Dr. Dwain Sarna from trauma surgery who recommended medical  admission.  Review of Systems:  All systems reviewed and apart from history of presenting illness, are negative.  Past Medical History:  Diagnosis Date  . Arthritis   . Carpal tunnel syndrome of left wrist   . Diabetes mellitus    not on insulin  . Gallstone pancreatitis 02/2008  . Heart murmur   . Hypertension   . Polio    as child     Past Surgical History:  Procedure Laterality Date  . CHOLECYSTECTOMY    . REVERSE SHOULDER ARTHROPLASTY Left 05/23/2014   Procedure: REVERSE SHOULDER ARTHROPLASTY;  Surgeon: Jenna Paris, MD;  Location: [facility] OR;  Service: Orthopedics;  Laterality: Left;  . REVERSE SHOULDER ARTHROPLASTY Right 12/11/2015   Procedure: REVERSE SHOULDER ARTHROPLASTY;  Surgeon: Jenna Broom, MD;  Location: [facility] OR;  Service: Orthopedics;  Laterality: Right;  Right reverse total shoulder arthroplasty  . STERIOD INJECTION Left 05/23/2014   Procedure: STEROID INJECTION THUMB;  Surgeon: Jenna Paris, MD;  Location: [facility] OR;  Service: Orthopedics;  Laterality: Left;  Steroid injection left thumb     reports that she quit smoking about 51 years ago. Her smoking use included cigarettes. She has a 20.00 pack-year smoking history. She has never used smokeless tobacco. She reports that she does not drink alcohol and does not use drugs.  No Known Allergies  Family History  Problem Relation Age of Onset  . Heart attack Mother        Died of MI, dec at 55   . Cirrhosis Father        alcohol induced     Prior to Admission medications   Medication Sig Start Date  End Date Taking? Authorizing Provider  atorvastatin (LIPITOR) 20 MG tablet Take 20 mg by mouth daily.   Yes [provider]  cholecalciferol (VITAMIN D3) 25 MCG (1000 UNIT) tablet Take 1,000 Units by mouth daily.   Yes [provider]  isosorbide-hydrALAZINE (BIDIL) 20-37.5 MG tablet Take 1 tablet by mouth 3 (three) times daily. 02/08/20 03/09/20 Yes Jenna, Anabel Bene, MD  metFORMIN  (GLUCOPHAGE) 500 MG tablet Take 1 tablet (500 mg total) by mouth 2 (two) times daily with a meal. Resume metformin on 02/09/2020 02/07/20  Yes Jenna, [place], DO  metoprolol tartrate (LOPRESSOR) 50 MG tablet TAKE 1 AND 1/2 TABLETS BY MOUTH TWICE DAILY Patient taking differently: TAKE 1 AND 1/2 TABLETS BY MOUTH TWICE DAILY 10/16/19  Yes Jenna, Anabel Bene, MD  OXYGEN Inhale into the lungs.   Yes [provider]  torsemide (DEMADEX) 20 MG tablet Take 1 tablet (20 mg total) by mouth every other day. 02/09/20 03/10/20 Yes Jenna, Maryann, DO  TOUJEO SOLOSTAR 300 UNIT/ML Solostar Pen Inject 30-35 Units into the skin in the morning and at bedtime. Inject 30 units in the morning and 35 units at night 09/04/19  Yes [provider]    Physical Exam: Vitals:   02/09/20 0000 02/09/20 0030 02/09/20 0100 02/09/20 0330  BP: (!) 142/67 138/63 135/70 137/66  Pulse: 64 62 (!) 54 (!) 54  Resp:      Temp:      TempSrc:      SpO2: 96% 91% 96% 95%  Weight:      Height:        Physical Exam Constitutional:      General: She is not in acute distress. HENT:     Head: Normocephalic and atraumatic.  Eyes:     Extraocular Movements: Extraocular movements intact.     Conjunctiva/sclera: Conjunctivae normal.  Cardiovascular:     Rate and Rhythm: Normal rate and regular rhythm.     Pulses: Normal pulses.  Pulmonary:     Effort: Pulmonary effort is normal. No respiratory distress.     Breath sounds: No wheezing or rales.  Abdominal:     General: Bowel sounds are normal. There is no distension.     Palpations: Abdomen is soft.     Tenderness: There is no abdominal tenderness.  Musculoskeletal:     Cervical back: Normal range of motion and neck supple.     Comments: +2 pedal edema bilaterally Significant point tenderness at the posterolateral left 10th and 11th ribs  Skin:    General: Skin is warm and dry.  Neurological:     General: No focal deficit present.     Mental Status: She  is alert and oriented to person, place, and time.     Sensory: No sensory deficit.     Motor: No weakness.     Labs on Admission: I have personally reviewed following labs and imaging studies  CBC: Recent Labs  Lab 02/04/20 1143 02/05/20 0313 02/08/20 2218  WBC 7.9 7.9 11.4*  NEUTROABS  --   --  4.8  HGB 11.3* 11.8* 10.6*  HCT 36.9 37.5 34.0*  MCV 81.8 80.3 81.3  PLT 299 301 310   Basic Metabolic Panel: Recent Labs  Lab 02/04/20 1143 02/05/20 0313 02/06/20 0219 02/07/20 0312 02/08/20 2218  NA 141 141 142 141 141  K 4.3 3.7 4.1 3.9 4.1  CL 106 104 109 111 108  CO2 21* 19*  GLUCOSE 157* 65* 70 123* 131*  BUN 26* 25* 23 19 25*  CREATININE 1.82* 1.61* 1.50* 1.55* 1.80*  CALCIUM 9.8 9.4 9.1 8.7* 9.1   GFR: Estimated Creatinine Clearance: 19.8 mL/min (A) (by C-G formula based on SCr of 1.8 mg/dL (H)). Liver Function Tests: Recent Labs  Lab 02/05/20 0313 02/08/20 2218  AST 19 28  ALT 14 21  ALKPHOS 83 81  BILITOT 1.0 1.1  PROT 7.1 7.0  ALBUMIN 3.5 3.6   Recent Labs  Lab 02/08/20 2218  LIPASE 29   No results for input(s): AMMONIA in the last 168 hours. Coagulation Profile: No results for input(s): INR, PROTIME in the last 168 hours. Cardiac Enzymes: No results for input(s): CKTOTAL, CKMB, CKMBINDEX, TROPONINI in the last 168 hours. BNP (last 3 results) No results for input(s): PROBNP in the last 8760 hours. HbA1C: No results for input(s): HGBA1C in the last 72 hours. CBG: Recent Labs  Lab 02/06/20 0622 02/06/20 1109 02/06/20 2122 02/07/20 0654 02/07/20 1116  GLUCAP 93 110* 144* 109* 207*   Lipid Profile: No results for input(s): CHOL, HDL, LDLCALC, TRIG, CHOLHDL, LDLDIRECT in the last 72 hours. Thyroid Function Tests: No results for input(s): TSH, T4TOTAL, FREET4, T3FREE, THYROIDAB in the last 72 hours. Anemia Panel: No results for input(s): VITAMINB12, FOLATE, FERRITIN, TIBC, IRON, RETICCTPCT in the last 72 hours. Urine analysis:      Component Value Date/Time   COLORURINE STRAW (A) 02/04/2020 2005   APPEARANCEUR CLEAR 02/04/2020 2005   LABSPEC 1.008 02/04/2020 2005   PHURINE 5.0 02/04/2020 2005   GLUCOSEU NEGATIVE 02/04/2020 2005   HGBUR NEGATIVE 02/04/2020 2005   BILIRUBINUR NEGATIVE 02/04/2020 2005   KETONESUR NEGATIVE 02/04/2020 2005   PROTEINUR NEGATIVE 02/04/2020 2005   UROBILINOGEN 0.2 05/15/2014 0858   NITRITE NEGATIVE 02/04/2020 2005   LEUKOCYTESUR TRACE (A) 02/04/2020 2005    Radiological Exams on Admission: CT Abdomen Pelvis Wo Contrast  Result Date: 02/08/2020 CLINICAL DATA:  Trauma, fall, struck back on wall of bathtub, left abdominal pain and low back pain EXAM: CT ABDOMEN AND PELVIS WITHOUT CONTRAST TECHNIQUE: Multidetector CT imaging of the abdomen and pelvis was performed following the standard protocol without IV contrast. COMPARISON:  Contemporary lumbar spine reconstructions. FINDINGS: Lower chest: Mid subpleural reticular opacities present in both lung bases could reflect some interstitial reticular change or atelectatic features similar to comparison CT 1 day prior. Some mild basilar bronchiectatic features. No consolidation or pleural effusion. Cardiomegaly. Hypoattenuation of the cardiac blood pool may reflect some mild anemia. Three-vessel coronary artery atherosclerosis as well as calcifications upon the aortic leaflets. No pericardial effusion. Distal thoracic aortic atherosclerosis. Hepatobiliary: No direct liver injury or perihepatic hemorrhage is seen. No worrisome focal liver lesions within the limitations of this unenhanced CT. Smooth liver surface contour. Normal liver attenuation. Prior cholecystectomy. No significant biliary dilatation or visible intraductal gallstones. Pancreas: No definite pancreatic contusion or ductal disruption multiple air lucencies about the head of the pancreas correspond to extensive duodenal diverticular disease including several larger air and fluid-filled  diverticula present on comparison as remote as 2009. Moderate pancreatic atrophy. No focal peripancreatic inflammation or ductal dilatation. Spleen: No direct splenic injury or perisplenic hematoma. Slightly diminutive size of the spleen with likely vascular calcification. No concerning splenic lesion. Adrenals/Urinary Tract: No suspicious adrenal lesion or dominant nodule nor evidence of adrenal hemorrhage. Some mild lobular thickening can reflect some senescent adrenal hyperplasia. Mild symmetric bilateral perinephric stranding, a nonspecific finding which may correlate with advanced age or decreased renal function. Few capsular calcifications seen in the  inferior pole right kidney may reflect post infectious or inflammatory change. No concerning focal renal lesion. No urolithiasis or hydronephrosis. Slightly increased attenuation of the urinary bladder may reflect recent contrast administration for angiography performed 02/06/2020. Urinary bladder is otherwise unremarkable. Stomach/Bowel: Distal esophagus and stomach are unremarkable. Extensive duodenal diverticulosis with larger air and fluid-filled diverticula adjacent the pancreatic head and uncinate are similar to comparison. Duodenum with a normal sweep across the midline abdomen. No small bowel wall thickening or dilatation. A normal appendix is visualized. No colonic dilatation or wall thickening. Scattered colonic diverticula without focal inflammation to suggest diverticulitis. No evidence of obstruction. No evidence of mesenteric hematoma or contusion. Vascular/Lymphatic: Atherosclerotic calcifications within the abdominal aorta and branch vessels. No aneurysm or ectasia. No enlarged abdominopelvic lymph nodes. Reproductive: Anteverted uterus with parametrial calcification and atrophic appearance of the ovaries is age-appropriate. No concerning uterine or adnexal lesions. Other: No abdominopelvic free air or fluid. No large body wall hematoma.  Subcutaneous nodularity along the low anterior are abdominal wall on the left likely related to injectable use. Posterior body wall edema. No bowel containing hernia or traumatic abdominal wall dehiscence. Musculoskeletal: Dedicated lumbar reconstructions generated and dictated separately, please see report for further details. In brief, there are displaced left L1 through L4 transverse process fractures. Additionally, there minimally displaced fractures of posterolateral left tenth and eleventh ribs. No other acute traumatic osseous injury of the abdomen or pelvis. Bony pelvis is intact. Femoral heads are normally located. Multilevel discogenic and facet degenerative changes as well as degenerative changes of the SI joints and bilateral hips. No acute abnormality of the partially included right hand. IMPRESSION: 1. Unenhanced CT was performed per clinician order. Lack of IV contrast limits sensitivity and specificity, especially for evaluation of abdominal/pelvic solid viscera. 2. Displaced left L1 through L4 transverse process fractures. For further details of the lumbar spine, please see dedicated reconstructions which were generated from this exam and dictated separately. 3. Minimally displaced fractures of posterolateral left tenth and eleventh ribs. 4. No other acute traumatic osseous injury of the abdomen or pelvis. No solid organ, hollow viscus or vascular injury is evident. 5. Mild symmetric bilateral perinephric stranding, a nonspecific finding which may correlate with advanced age or decreased renal function. If urinary symptoms are present, consider urinalysis to exclude infection. 6. Extensive duodenal diverticulosis with larger air and fluid-filled diverticula adjacent the pancreatic head and uncinate are similar to comparison. Colonic diverticulosis without evidence of diverticulitis. 7. Increased attenuation of the bladder contents likely related to intravenous contrast administration 02/06/2020. 8.  Cardiomegaly with coronary artery atherosclerosis. 9. Hypoattenuation of the cardiac blood pool may reflect some mild anemia. 10. Subpleural reticular changes in the lung bases may reflect atelectasis or interstitial fibrosis. 11. Aortic Atherosclerosis (ICD10-I70.0). Electronically Signed   By: Kreg ShropshirePrice  DeHay M.D.   On: 02/08/2020 21:57   DG Chest 2 View  Result Date: 02/07/2020 CLINICAL DATA:  Shortness of breath EXAM: CHEST - 2 VIEW COMPARISON:  02/04/2020 FINDINGS: Unchanged appearance of the chest with mild interstitial pulmonary edema and mild cardiomegaly. No focal airspace consolidation. No pleural effusion. IMPRESSION: Unchanged appearance of mild interstitial pulmonary edema. Electronically Signed   By: Deatra RobinsonKevin  Herman M.D.   On: 02/07/2020 20:12   DG Thoracic Spine 2 View  Result Date: 02/08/2020 CLINICAL DATA:  Fall, back pain, known transverse process fractures in the lumbar spine. EXAM: THORACIC SPINE 2 VIEWS COMPARISON:  Same-day CT abdomen and pelvis, CT L-spine. CT angiography of the chest 02/06/2020, radiograph  09/01/2012 FINDINGS: The osseous structures appear diffusely demineralized which may limit detection of small or nondisplaced fractures. Some slight anterior lower thoracic wedging about the T8, T9 levels are compatible with the progressive discogenic and facet degenerative changes seen on recent CT angiography of the chest when compared to more remote comparison radiography. Known left lumbar transverse process fractures and rib fractures are poorly visualized on this exam. Cholecystectomy clips present the right upper quadrant. Some coarsened interstitial changes are present in the lungs could reflect some mild edema as seen on comparison CT angiography chest. IMPRESSION: 1. Slight anterior thoracic wedging about the T8, T9 levels are compatible with the progressive discogenic and facet degenerative changes seen on recent CT angiography of the chest. However could correlate for point  tenderness and if there is persisting concern, CT or MR imaging could be obtained to assess for interval injury given demineralization limiting detection of subtle fractures. 2. Known left lumbar transverse process fractures and rib fractures are poorly visualized on this exam. Electronically Signed   By: Kreg Shropshire M.D.   On: 02/08/2020 22:37   CT Head Wo Contrast  Result Date: 02/08/2020 CLINICAL DATA:  Fall last night landing on back, no direct head injury. Lower back pain and spasms. On anticoagulation EXAM: CT HEAD WITHOUT CONTRAST CT CERVICAL SPINE WITHOUT CONTRAST TECHNIQUE: Multidetector CT imaging of the head and cervical spine was performed following the standard protocol without intravenous contrast. Multiplanar CT image reconstructions of the cervical spine were also generated. COMPARISON:  CT head 04/18/2004, CT cervical spine 11/17/2015 FINDINGS: CT HEAD FINDINGS Brain: Few punctate scattered hypoattenuating foci in the bilateral basal ganglia may reflect areas of prior lacunar type infarct. Mineralization of the bilateral basal ganglia as well, often a benign senescent finding. No evidence of acute infarction, hemorrhage, hydrocephalus, extra-axial collection, visible mass lesion or mass effect. Symmetric prominence of the ventricles, cisterns and sulci compatible with parenchymal volume loss. Patchy areas of white matter hypoattenuation are most compatible with chronic microvascular angiopathy. Vascular: Atherosclerotic calcification of the carotid siphons and intradural vertebral arteries. No hyperdense vessel. Left dominant vertebrobasilar system. Skull: No significant scalp swelling or hematoma. No calvarial fracture or acute osseous abnormality. Sinuses/Orbits: Minimal thickening in the ethmoids. Remaining paranasal sinuses and mastoid air cells are predominantly clear. No layering air-fluid levels or pneumatized secretions. Orbital structures are unremarkable aside from prior lens  extractions. Other: Moderate bilateral TMJ arthrosis. Edentulous with some mild mandibular prognathism. CT CERVICAL SPINE FINDINGS Alignment: A stabilization collar is absent at the time of examination. There is straightening of the normal cervical lordosis. Slight stepwise retrolisthesis is present C3-C6 and with mild retrolisthesis C6 on C7 and 2 mm anterolisthesis C7 on T1. These findings are similar to comparison radiography and favored to be on a degenerative basis with multilevel spondylitic changes. No evidence of traumatic listhesis. No abnormally widened, perched or jumped facets. Normal alignment of the craniocervical and atlantoaxial articulations. Skull base and vertebrae: No acute skull base fracture. No vertebral body fracture or height loss. The osseous structures appear diffusely demineralized which may limit detection of small or nondisplaced fractures. Moderate arthrosis at the atlantodental and basion dens interval with degenerative spurring. Multilevel cervical spondylitic changes are present as well, better detailed below. Soft tissues and spinal canal: No pre or paravertebral fluid or swelling. No visible canal hematoma. Disc levels: Multilevel intervertebral disc height loss with spondylitic endplate changes. Slightly larger disc osteophyte complexes present at C6-7 which in combination with some posterior ligamentum flavum infolding in  on left facet hypertrophic change result in moderate canal stenosis at this level. No other significant canal narrowing. Multilevel uncinate spurring and facet hypertrophic changes elsewhere result in mild-to-moderate multilevel neural foraminal narrowing. Upper chest: No acute abnormality in the upper chest or imaged lung apices. Other: Atherosclerotic calcifications seen in the proximal great vessels. Cervical carotid atherosclerosis is present as well. Normal thyroid. IMPRESSION: 1. No acute intracranial abnormality. No significant scalp swelling or  calvarial fracture. 2. Chronic microvascular ischemic changes, parenchymal volume loss and intracranial atherosclerosis. 3. No evidence of acute fracture or traumatic listhesis of the cervical spine. 4. Multilevel degenerative changes of the cervical spine, as described above. Features maximal at C6-7 with at most moderate canal stenosis and multilevel mild-to-moderate neural foraminal narrowing. Electronically Signed   By: Kreg Shropshire M.D.   On: 02/08/2020 21:34   CT Chest Wo Contrast  Result Date: 02/09/2020 CLINICAL DATA:  Fall EXAM: CT THORACIC SPINE WITHOUT CONTRAST; CT CHEST WITHOUT CONTRAST TECHNIQUE: Multidetector CT images of the chest were obtained using the standard protocol without intravenous contrast. Dedicated thoracic spine images were reconstructed from the chest CT data. COMPARISON:  None. FINDINGS: CT CHEST FINDINGS Cardiovascular: There is atherosclerotic calcification of the aorta with coronary artery calcifications as well. Mild cardiomegaly without pericardial effusion. Mediastinum/Nodes: No mediastinal, hilar or axillary lymphadenopathy. Normal visualized thyroid. Thoracic esophageal course is normal. Lungs/Pleura: 3 mm right upper lobe nodule (image 44). Multifocal scarring/atelectasis. No pleural effusion or pulmonary contusion. Upper abdomen: Unremarkable Musculoskeletal: No chest wall abnormality. No bony spinal canal stenosis. CT THORACIC SPINE FINDINGS Alignment: Normal. Vertebrae: No acute fracture or focal pathologic process. Disc levels: No spinal canal stenosis IMPRESSION: 1. No acute abnormality of the chest or thoracic spine. 2. 3 mm right upper lobe pulmonary nodule. No follow-up needed if patient is low-risk. Non-contrast chest CT can be considered in 12 months if patient is high-risk. This recommendation follows the consensus statement: Guidelines for Management of Incidental Pulmonary Nodules Detected on CT Images: From the Fleischner Society 2017; Radiology 2017;  [id number]. Aortic Atherosclerosis (ICD10-I70.0). Electronically Signed   By: Deatra Robinson M.D.   On: 02/09/2020 02:16   CT Cervical Spine Wo Contrast  Result Date: 02/08/2020 CLINICAL DATA:  Fall last night landing on back, no direct head injury. Lower back pain and spasms. On anticoagulation EXAM: CT HEAD WITHOUT CONTRAST CT CERVICAL SPINE WITHOUT CONTRAST TECHNIQUE: Multidetector CT imaging of the head and cervical spine was performed following the standard protocol without intravenous contrast. Multiplanar CT image reconstructions of the cervical spine were also generated. COMPARISON:  CT head 04/18/2004, CT cervical spine 11/17/2015 FINDINGS: CT HEAD FINDINGS Brain: Few punctate scattered hypoattenuating foci in the bilateral basal ganglia may reflect areas of prior lacunar type infarct. Mineralization of the bilateral basal ganglia as well, often a benign senescent finding. No evidence of acute infarction, hemorrhage, hydrocephalus, extra-axial collection, visible mass lesion or mass effect. Symmetric prominence of the ventricles, cisterns and sulci compatible with parenchymal volume loss. Patchy areas of white matter hypoattenuation are most compatible with chronic microvascular angiopathy. Vascular: Atherosclerotic calcification of the carotid siphons and intradural vertebral arteries. No hyperdense vessel. Left dominant vertebrobasilar system. Skull: No significant scalp swelling or hematoma. No calvarial fracture or acute osseous abnormality. Sinuses/Orbits: Minimal thickening in the ethmoids. Remaining paranasal sinuses and mastoid air cells are predominantly clear. No layering air-fluid levels or pneumatized secretions. Orbital structures are unremarkable aside from prior lens extractions. Other: Moderate bilateral TMJ arthrosis. Edentulous with some mild mandibular prognathism. CT  CERVICAL SPINE FINDINGS Alignment: A stabilization collar is absent at the time of examination. There is  straightening of the normal cervical lordosis. Slight stepwise retrolisthesis is present C3-C6 and with mild retrolisthesis C6 on C7 and 2 mm anterolisthesis C7 on T1. These findings are similar to comparison radiography and favored to be on a degenerative basis with multilevel spondylitic changes. No evidence of traumatic listhesis. No abnormally widened, perched or jumped facets. Normal alignment of the craniocervical and atlantoaxial articulations. Skull base and vertebrae: No acute skull base fracture. No vertebral body fracture or height loss. The osseous structures appear diffusely demineralized which may limit detection of small or nondisplaced fractures. Moderate arthrosis at the atlantodental and basion dens interval with degenerative spurring. Multilevel cervical spondylitic changes are present as well, better detailed below. Soft tissues and spinal canal: No pre or paravertebral fluid or swelling. No visible canal hematoma. Disc levels: Multilevel intervertebral disc height loss with spondylitic endplate changes. Slightly larger disc osteophyte complexes present at C6-7 which in combination with some posterior ligamentum flavum infolding in on left facet hypertrophic change result in moderate canal stenosis at this level. No other significant canal narrowing. Multilevel uncinate spurring and facet hypertrophic changes elsewhere result in mild-to-moderate multilevel neural foraminal narrowing. Upper chest: No acute abnormality in the upper chest or imaged lung apices. Other: Atherosclerotic calcifications seen in the proximal great vessels. Cervical carotid atherosclerosis is present as well. Normal thyroid. IMPRESSION: 1. No acute intracranial abnormality. No significant scalp swelling or calvarial fracture. 2. Chronic microvascular ischemic changes, parenchymal volume loss and intracranial atherosclerosis. 3. No evidence of acute fracture or traumatic listhesis of the cervical spine. 4. Multilevel  degenerative changes of the cervical spine, as described above. Features maximal at C6-7 with at most moderate canal stenosis and multilevel mild-to-moderate neural foraminal narrowing. Electronically Signed   By: Kreg Shropshire M.D.   On: 02/08/2020 21:34   CT T-SPINE NO CHARGE  Result Date: 02/09/2020 CLINICAL DATA:  Fall EXAM: CT THORACIC SPINE WITHOUT CONTRAST; CT CHEST WITHOUT CONTRAST TECHNIQUE: Multidetector CT images of the chest were obtained using the standard protocol without intravenous contrast. Dedicated thoracic spine images were reconstructed from the chest CT data. COMPARISON:  None. FINDINGS: CT CHEST FINDINGS Cardiovascular: There is atherosclerotic calcification of the aorta with coronary artery calcifications as well. Mild cardiomegaly without pericardial effusion. Mediastinum/Nodes: No mediastinal, hilar or axillary lymphadenopathy. Normal visualized thyroid. Thoracic esophageal course is normal. Lungs/Pleura: 3 mm right upper lobe nodule (image 44). Multifocal scarring/atelectasis. No pleural effusion or pulmonary contusion. Upper abdomen: Unremarkable Musculoskeletal: No chest wall abnormality. No bony spinal canal stenosis. CT THORACIC SPINE FINDINGS Alignment: Normal. Vertebrae: No acute fracture or focal pathologic process. Disc levels: No spinal canal stenosis IMPRESSION: 1. No acute abnormality of the chest or thoracic spine. 2. 3 mm right upper lobe pulmonary nodule. No follow-up needed if patient is low-risk. Non-contrast chest CT can be considered in 12 months if patient is high-risk. This recommendation follows the consensus statement: Guidelines for Management of Incidental Pulmonary Nodules Detected on CT Images: From the Fleischner Society 2017; Radiology 2017; 284:228-243. Aortic Atherosclerosis (ICD10-I70.0). Electronically Signed   By: Deatra Robinson M.D.   On: 02/09/2020 02:16   CT L-SPINE NO CHARGE  Result Date: 02/08/2020 CLINICAL DATA:  Fall, struck back with low  back pain EXAM: CT LUMBAR SPINE WITHOUT CONTRAST TECHNIQUE: Multiplanar reconstructions of the lumbar spine were generated from the contemporary CT of the abdomen and pelvis. COMPARISON:  Contemporary CT abdomen and pelvis. FINDINGS:  Segmentation: 5 normally formed lumbar type vertebral bodies. Lowest fully formed disc space denoted as L5-S1. Alignment: Mild straightening of normal lumbar lordosis. Minimal retrolisthesis of L4 on L5 of approximately 3 mm. Favored to be on a facet degenerative basis without spondylolysis. No abnormally widened, jumped or perched facets. Vertebrae: No acute vertebral body fracture or height loss is seen. There are displaced fractures of the left L1 through L4 transverse processes. No other acute fracture or traumatic osseous injury is seen in the lumbar spine or included portions of the bony pelvis. 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 Multilevel discogenic and facet degenerative changes are present, detailed below level by level. 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 Additional degenerative changes in the bilateral SI joints and hips. Paraspinal and other soft tissues: Minimal soft tissue thickening seen adjacent the left L1-L4 transverse process fractures. No other acute paravertebral fluid, swelling, gas or hemorrhage. No visible canal hematoma within the limitations of this exam. Posterior body wall edema. For findings in the posterior abdomen and pelvis, please see dedicated CT from which this study is reconstructed. Disc levels: Level by level evaluation of the lumbar spine below: T11-T12: Disc desiccation and height loss with small global disc bulge and moderate facet arthropathy, left greater than right with severe left and moderate right neural foraminal narrowing. T12-L1: Mild disc height loss and bilateral facet arthropathy. Shallow global disc bulge.  No significant spinal canal or foraminal stenosis. L1-L2: Moderate to severe disc height loss with desiccation and Schmorl's node formations and mild bilateral facet arthropathy. No significant canal stenosis. Mild left and moderate right foraminal narrowing. L2-L3: Severe disc height loss, desiccation and vacuum disc with moderate bilateral facet arthropathy, posterior ridging and global disc bulge. Moderate canal stenosis and mild bilateral foraminal narrowing. L3-L4: Mild disc height loss with desiccation, Schmorl's node formation and vacuum disc phenomenon. Mild bilateral facet arthropathy. Shallow global disc bulge and ligamentum flavum infolding with at most mild canal stenosis but no significant neural foraminal narrowing. L4-L5: Retrolisthesis, mild disc height loss and moderate bilateral facet arthropathy, with shallow global disc bulge and some mild ligamentum flavum infolding at most mild canal stenosis but with moderate to severe bilateral foraminal narrowing. L5-S1: Severe disc height loss, desiccation and vacuum disc as well as severe bilateral facet arthropathy. Shallow global disc bulge. No significant canal stenosis. Moderate to severe bilateral neural foraminal narrowing. IMPRESSION: 1. Displaced fractures of the left L1 through L4 transverse processes. 2. No other acute fracture or traumatic osseous injury is seen in the lumbar spine or included portions of the bony pelvis. 3. Diffuse bony demineralization may limit detection of subtle nondisplaced fractures. 4. Multilevel discogenic and facet degenerative changes as described above, with moderate canal stenosis at L2-L3 and mild canal stenosis at L3-L4 and L4-L5. Multilevel moderate to severe neural foraminal narrowing is present as well. 5. For findings in the posterior abdomen and pelvis, please see dedicated CT from which this study is reconstructed. Electronically Signed   By: Kreg Shropshire M.D.   On: 02/08/2020 21:44    EKG: Independently  reviewed.  Sinus rhythm, RBBB, LAFB.  No significant change since prior tracing.  Assessment/Plan Principal Problem:   Lumbar transverse process fracture (HCC) Active Problems:   Essential hypertension   CHF (congestive heart failure) (HCC)   AKI (acute kidney injury) (HCC)   Fall   Displaced left L1-L4 transverse process fractures Identified on CT of  lumbar spine.  No focal neuro deficits. -Morphine as needed for pain.  PT/OT.  Rib fractures CT abdomen pelvis showing minimally displaced fractures of the posterolateral left 10th and 11th ribs.  Patient subsequently had CT chest done which did not show any acute abnormality.  She does have significant point tenderness in this region. -Discussed with on-call radiologist who reviewed the patient's CT scan again and feels that there may be minimally displaced fractures of the posterolateral left 10th and 11th ribs. -Pain control, pulmonary toilet   Mechanical fall at home -PT/OT evaluation, fall precautions  AKI on CKD stage IIIa Creatinine currently 1.8.  Baseline around 1.2.  Creatinine has trended down to 1.5 at the time of hospital discharge 2 days ago. -Hold home torsemide at this time.  Monitor renal function and urine output.  Check urine sodium and creatinine.  Avoid nephrotoxic agents.  Normal anion gap metabolic acidosis Bicarb 19, anion gap 14.  Likely related to AKI. -Continue to monitor BMP  Insulin dependent type 2 diabetes -Hold home Metformin.  Continue home Toujeo 30 units in the morning and 35 units at night.  Sliding scale insulin with meals.  Check A1c.  Hypertension Stable. -Continue BiDil and metoprolol  Hyperlipidemia -Continue Lipitor  Chronic diastolic CHF Has peripheral edema but lungs clear. -Hold torsemide at this time given worsening renal function.  Pulmonary nodule CT showing a 3 mm right upper lobe pulmonary nodule.  -Patient will need noncontrast chest CT in 12 months for follow-up.  DVT  prophylaxis: SCDs at this time Code Status: Full code.  Discussed with the patient. Family Communication: No family available at this time. Disposition Plan: Status is: Inpatient  Remains inpatient appropriate because:Ongoing active pain requiring inpatient pain management and Unsafe d/c plan   Dispo: The patient is from: Home              Anticipated d/c is to: SNF              Anticipated d/c date is: 3 days              Patient currently is not medically stable to d/c.  The medical decision making on this patient was of high complexity and the patient is at high risk for clinical deterioration, therefore this is a level 3 visit.  John Giovanni MD [facility]  If 7PM-7AM, please contact night-coverage [email or web address]  02/09/2020, 4:21 AM

## 2020-02-09 NOTE — Plan of Care (Signed)
  Problem: Education: Goal: Knowledge of General Education information will improve Description: Including pain rating scale, medication(s)/side effects and non-pharmacologic comfort measures Outcome: Progressing   Problem: Health Behavior/Discharge Planning: Goal: Ability to manage health-related needs will improve Outcome: Progressing   Problem: Clinical Measurements: Goal: Will remain free from infection Outcome: Progressing   Problem: Activity: Goal: Risk for activity intolerance will decrease Outcome: Progressing   Problem: Nutrition: Goal: Adequate nutrition will be maintained Outcome: Progressing   Problem: Coping: Goal: Level of anxiety will decrease Outcome: Progressing   Problem: Pain Managment: Goal: General experience of comfort will improve Outcome: Progressing   

## 2020-02-09 NOTE — Evaluation (Signed)
Occupational Therapy Evaluation Patient Details Name: Jenna Murphy MRN: [id number] DOB: 03-16-1928 Today's Date: 02/09/2020    History of Present Illness Jenna Murphy is a 86 y.o. female with medical history significant of noninsulin-dependent type 2 diabetes, gallstone pancreatitis, hypertension, hyperlipidemia, chronic diastolic CHF, CKD stage IIIa presenting to the ED after a fall.  Patient was discharged from the hospital on 02/07/2020 after being treated for CHF exacerbation and AKI.  Patient states she was in the bathroom wearing silk pajamas.  Somehow her foot slipped on the fabric and she fell to the side of the tub and injured her back and left side.  Workup reveals displaced left L1-L4 transverse process fractures and fractures of the posterolateral left 10th and 11th ribs.   Clinical Impression   Patient admitted with the above diagnosis.  She presents with pain to L ribs and back, and poor stand balance; all of which are impacting independence with ADL, toilet, and functional mobility compared to prior level.  At home she was very independent, drove, cared for herself, and used a RW for mobility.  OT will follow in the acute care setting.  SNF is recommended at this point, but if she progresses functionally, and family can assist in her care, HH is certainly appropriate.      Follow Up Recommendations  SNF;Supervision - Intermittent    Equipment Recommendations  3 in 1 bedside commode    Recommendations for Other Services       Precautions / Restrictions Precautions Precautions: Back;Fall Precaution Booklet Issued: No Precaution Comments: No brace Restrictions Weight Bearing Restrictions: No      Mobility Bed Mobility Overal bed mobility: Needs Assistance Bed Mobility: Sit to Supine       Sit to supine: Min assist   General bed mobility comments: used railing to pull to edge of bed    Transfers Overall transfer level: Needs assistance Equipment used:  Rolling walker (2 wheeled) Transfers: Sit to/from [employer] Sit to Stand: Min assist Stand pivot transfers: Min assist       General transfer comment: min assist for safety and to power up    Balance Overall balance assessment: Needs assistance Sitting-balance support: No upper extremity supported;Feet supported Sitting balance-Leahy Scale: Fair Sitting balance - Comments: pain decreases ssit balance.   Standing balance support: Bilateral upper extremity supported Standing balance-Leahy Scale: Poor Standing balance comment: reliant on RW for balance                           ADL either performed or assessed with clinical judgement   ADL Overall ADL's : Needs assistance/impaired Eating/Feeding: Independent;Sitting   Grooming: Wash/dry hands;Wash/dry face;Set up;Sitting   Upper Body Bathing: Minimal assistance;Sitting   Lower Body Bathing: Maximal assistance;Sit to/from stand   Upper Body Dressing : Moderate assistance Upper Body Dressing Details (indicate cue type and reason): assist bringing around trunk and threading opposite arm Lower Body Dressing: Maximal assistance;Sit to/from stand Lower Body Dressing Details (indicate cue type and reason): limiting bending forward Toilet Transfer: Minimal assistance           Functional mobility during ADLs: Minimal assistance;Rolling walker       Vision Patient Visual Report: No change from baseline       Perception     Praxis      Pertinent Vitals/Pain Pain Assessment: Faces Pain Score: 7  Faces Pain Scale: Hurts even more Pain Location: back, side trunk Pain Descriptors /  Indicators: Guarding;Grimacing Pain Intervention(s): Limited activity within patient's tolerance;Monitored during session;Repositioned     Hand Dominance Right   Extremity/Trunk Assessment Upper Extremity Assessment Upper Extremity Assessment: Overall WFL for tasks assessed   Lower Extremity Assessment Lower  Extremity Assessment: Generalized weakness   Cervical / Trunk Assessment Cervical / Trunk Assessment: Normal   Communication Communication Communication: No difficulties   Cognition Arousal/Alertness: Awake/alert Behavior During Therapy: WFL for tasks assessed/performed Overall Cognitive Status: Within Functional Limits for tasks assessed                                     General Comments       Exercises     Shoulder Instructions      Home Living Family/patient expects to be discharged to:: Private residence Living Arrangements: Alone Available Help at Discharge: Family;Friend(s);Available PRN/intermittently Type of Home: Apartment Home Access: Level entry     Home Layout: One level     Bathroom Shower/Tub: Chief Strategy Officer: Standard Bathroom Accessibility: Yes How Accessible: Accessible via walker Home Equipment: Walker - 2 wheels;Hand held shower head;Shower seat          Prior Functioning/Environment Level of Independence: Independent with assistive device(s)                 OT Problem List: Decreased activity tolerance;Impaired balance (sitting and/or standing);Decreased knowledge of use of DME or AE;Decreased knowledge of precautions;Pain      OT Treatment/Interventions: Self-care/ADL training;Therapeutic exercise;Balance training;Patient/family education;Therapeutic activities    OT Goals(Current goals can be found in the care plan section) Acute Rehab OT Goals Patient Stated Goal: Stop hurting and move better OT Goal Formulation: With patient Time For Goal Achievement: 02/25/20 Potential to Achieve Goals: Fair ADL Goals Pt Will Perform Grooming: with set-up;sitting Pt Will Perform Lower Body Bathing: with supervision;sit to/from stand Pt Will Perform Lower Body Dressing: with supervision;sit to/from stand Pt Will Transfer to Toilet: with supervision;ambulating;regular height toilet  OT Frequency: Min 2X/week    Barriers to D/C: Decreased caregiver support          Co-evaluation              AM-PAC OT "6 Clicks" Daily Activity     Outcome Measure Help from another person eating meals?: None Help from another person taking care of personal grooming?: A Little Help from another person toileting, which includes using toliet, bedpan, or urinal?: A Lot Help from another person bathing (including washing, rinsing, drying)?: A Lot Help from another person to put on and taking off regular upper body clothing?: A Lot Help from another person to put on and taking off regular lower body clothing?: A Lot 6 Click Score: 15   End of Session Equipment Utilized During Treatment: Rolling walker;Oxygen Nurse Communication: Mobility status  Activity Tolerance: Patient limited by pain Patient left: in bed;with call bell/phone within reach;with nursing/sitter in room;with family/visitor present  OT Visit Diagnosis: Unsteadiness on feet (R26.81);History of falling (Z91.81)                Time: 6144-3154 OT Time Calculation (min): 21 min Charges:  OT General Charges $OT Visit: 1 Visit OT Evaluation $OT Eval Moderate Complexity: 1 Mod  02/09/2020  Rich, OTR/L  Acute Rehabilitation Services  Office:  [phone]   Suzanna Obey 02/09/2020, 1:53 PM

## 2020-02-09 NOTE — Plan of Care (Signed)
  Problem: Activity: Goal: Risk for activity intolerance will decrease Outcome: Progressing   Problem: Nutrition: Goal: Adequate nutrition will be maintained Outcome: Progressing   Problem: Coping: Goal: Level of anxiety will decrease Outcome: Progressing   Problem: Elimination: Goal: Will not experience complications related to bowel motility Outcome: Progressing   Problem: Pain Managment: Goal: General experience of comfort will improve Outcome: Progressing   

## 2020-02-09 NOTE — Evaluation (Signed)
Physical Therapy Evaluation Patient Details Name: Jenna Murphy MRN: [id number] DOB: September 17, 1927 Today's Date: 02/09/2020   History of Present Illness  Jenna Murphy is a 86 y.o. female with medical history significant of noninsulin-dependent type 2 diabetes, gallstone pancreatitis, hypertension, hyperlipidemia, chronic diastolic CHF, CKD stage IIIa presenting to the ED after a fall.  Patient was discharged from the hospital on 02/07/2020 after being treated for CHF exacerbation and AKI.  Patient states she was in the bathroom wearing silk pajamas.  Somehow her foot slipped on the fabric and she fell to the side of the tub and injured her back and left side.  Workup reveals displaced left L1-L4 transverse process fractures and fractures of the posterolateral left 10th and 11th ribs.  Clinical Impression  Patient presents with mild dependencies in gait and mobility.  Patient would benefit from PT to increase independence and gait for return home.  Patient reports she lives alone but has family that checks in on her and that is close by.  Ideally, it would be best if family could stay with her for a day or 2 to assist with meals, mobility, etc.      Follow Up Recommendations Home health PT    Equipment Recommendations  None recommended by PT    Recommendations for Other Services       Precautions / Restrictions Precautions Precautions: Back;Fall      Mobility  Bed Mobility Overal bed mobility: Modified Independent             General bed mobility comments: used railing to pull to edge of bed    Transfers Overall transfer level: Needs assistance Equipment used: Rolling walker (2 wheeled) Transfers: Sit to/from Stand Sit to Stand: Min assist         General transfer comment: min assist for safety and to power up  Ambulation/Gait Ambulation/Gait assistance: Min assist Gait Distance (Feet): 30 Feet Assistive device: Rolling walker (2 wheeled) Gait Pattern/deviations:  Step-through pattern Gait velocity: decreased      Stairs            Wheelchair Mobility    Modified Rankin (Stroke Patients Only)       Balance Overall balance assessment: Needs assistance Sitting-balance support: No upper extremity supported;Feet supported Sitting balance-Leahy Scale: Good     Standing balance support: During functional activity;Bilateral upper extremity supported Standing balance-Leahy Scale: Poor Standing balance comment: reliant on RW for balance                             Pertinent Vitals/Pain Pain Assessment: 0-10 Pain Score: 7  Pain Location: back, side trunk Pain Descriptors / Indicators: Aching;Discomfort Pain Intervention(s): Limited activity within patient's tolerance;Monitored during session    Home Living Family/patient expects to be discharged to:: Private residence Living Arrangements: Alone Available Help at Discharge: Family;Friend(s);Available PRN/intermittently Type of Home: Apartment Home Access: Level entry     Home Layout: One level Home Equipment: Walker - 2 wheels      Prior Function Level of Independence: Independent               Hand Dominance        Extremity/Trunk Assessment   Upper Extremity Assessment Upper Extremity Assessment: Defer to OT evaluation    Lower Extremity Assessment Lower Extremity Assessment: Generalized weakness    Cervical / Trunk Assessment Cervical / Trunk Assessment: Normal  Communication   Communication: No difficulties  Cognition Arousal/Alertness: Awake/alert  Behavior During Therapy: WFL for tasks assessed/performed Overall Cognitive Status: Within Functional Limits for tasks assessed                                        General Comments      Exercises     Assessment/Plan    PT Assessment Patient needs continued PT services  PT Problem List Decreased activity tolerance;Decreased balance;Decreased mobility;Decreased knowledge  of use of DME       PT Treatment Interventions DME instruction;Gait training;Functional mobility training;Therapeutic activities;Therapeutic exercise;Balance training;Patient/family education    PT Goals (Current goals can be found in the Care Plan section)  Acute Rehab PT Goals Patient Stated Goal: go home when I get better PT Goal Formulation: With patient Time For Goal Achievement: 02/16/20 Potential to Achieve Goals: Good    Frequency Min 4X/week   Barriers to discharge Decreased caregiver support      Co-evaluation               AM-PAC PT "6 Clicks" Mobility  Outcome Measure Help needed turning from your back to your side while in a flat bed without using bedrails?: A Little Help needed moving from lying on your back to sitting on the side of a flat bed without using bedrails?: A Little Help needed moving to and from a bed to a chair (including a wheelchair)?: A Little Help needed standing up from a chair using your arms (e.g., wheelchair or bedside chair)?: A Little Help needed to walk in hospital room?: A Little Help needed climbing 3-5 steps with a railing? : A Little 6 Click Score: 18    End of Session   Activity Tolerance: Patient tolerated treatment well;No increased pain Patient left: in chair;with call bell/phone within reach;with chair alarm set Nurse Communication: Mobility status PT Visit Diagnosis: Unsteadiness on feet (R26.81);History of falling (Z91.81);Muscle weakness (generalized) (M62.81)    Time: 9381-8299 PT Time Calculation (min) (ACUTE ONLY): 20 min   Charges:   PT Evaluation $PT Eval Moderate Complexity: 1 Mod          02/09/2020 Margie, PT Acute Rehabilitation Services Pager:  [phone] Office:  [phone]    Olivia Canter 02/09/2020, 1:06 PM

## 2020-02-09 NOTE — Progress Notes (Signed)
PROGRESS NOTE  Jenna Murphy:[id number] DOB: 12-Jun-1927   PCP: Andi Devon, MD  Patient is from: Home  DOA: 02/08/2020 LOS: 0  Chief complaints: Accidental fall and back pain  Brief Narrative / Interim history: 86 year old female with history of NIDDM-2, goals of pancreatitis, diastolic CHF, CKD-3A, HLD and recent hospitalization for CHF exacerbation and AKI returning with left-sided back pain after accidental fall at home.  Reportedly had slipped and fell in the bathtub.  No head trauma or loss of consciousness.  In ED, hemodynamically stable.  Hgb 10.6 (at baseline). Cr 1.8 (b/l ~1.5).  CT head, cervical spine and thoracic spine without acute finding.  CT lumbar spine showed displaced left L1-L4 transverse process fractures.  CT chest, abdomen and pelvis showed minimally displaced fractures of the posterolateral left 10th and 11th ribs.  Admitted for pain control and therapy  Subjective: Seen and examined earlier this morning.  No major events overnight of this morning.  Endorses 10/10 pain in her left upper back.  Pain is worse with movement and deep breathing.  She denies anterior chest pain, dyspnea, GI or UTI symptoms.  However, she had an episode of emesis later after I saw her per her RN.  Objective: Vitals:   02/09/20 0330 02/09/20 0539 02/09/20 0805 02/09/20 1521  BP: 137/66 (!) 177/50 (!) 155/84 120/73  Pulse: (!) 54 76 76 61  Resp:  20 20   Temp:  98.4 F (36.9 C) 98.3 F (36.8 C) 98.1 F (36.7 C)  TempSrc:  Oral Oral Oral  SpO2: 95% 95% 96% 99%  Weight:      Height:        Intake/Output Summary (Last 24 hours) at 02/09/2020 1526 Last data filed at 02/09/2020 1430 Gross per 24 hour  Intake 480 ml  Output --  Net 480 ml   Filed Weights   02/08/20 1844  Weight: 68.9 kg    Examination:  GENERAL: No apparent distress.  Nontoxic. HEENT: MMM.  Vision and hearing grossly intact.  NECK: Supple.  No apparent JVD.  RESP: On 2 L by Flasher.  No IWOB.  Fair  aeration bilaterally. CVS:  RRR. Heart sounds normal.  ABD/GI/GU: BS+. Abd soft, NTND.  MSK/EXT:  Moves extremities.  Tenderness over left middle back.  No tenderness over spinous processes SKIN: no apparent skin lesion or wound NEURO: Awake, alert and oriented appropriately.  No apparent focal neuro deficit. PSYCH: Calm. Normal affect.   Procedures:  None  Microbiology summarized: COVID-19 PCR negative. Influenza PCR negative.  Assessment & Plan: Accidental fall at home-slipped & fell in bathtub. Not head trauma or LOC.  CT head & cervical spine negative Displaced left L1-L4 transverse process fractures  Traumatic rib fracture-CT showed mildly displaced fractures of left 10th and 11th ribs posterolaterally -K pad, Lidoderm, scheduled Tylenol with as needed oxycodone and IV morphine for pain control -Bowel regimen and antiemetics -Encourage incentive spirometry  Chronic diastolic CHF: Appears euvolemic.  No acute cardiopulmonary symptoms. -Continue holding torsemide in the setting of AKI -Monitor fluid status and renal function  AKI on CKD stage IIIa: Cr 1.8 (admit)>> 1.41.  Baseline~1.2. -Continue holding torsemide.  No cardiopulmonary symptoms. -Continue monitoring  Normal anion gap metabolic acidosis: Likely due to AKI. -Continue monitoring  Uncontrolled IDDM-2 with hyperglycemia: Recent Labs  Lab 02/06/20 2122 02/07/20 0654 02/07/20 1116 02/09/20 0704 02/09/20 1134  GLUCAP 144* 109* 207* 117* 166*  -Continue SSI -Reduce Lantus to 20 units twice daily -Continue home statin  Hypertension: Normotensive -  Continue metoprolol and BiDil.  Hyperlipidemia -Continue Lipitor  Pulmonary nodule: CT showing a 3 mm right upper lobe pulmonary nodule.  -noncontrast chest CT in 12 months for follow-up.  Debility/physical deconditioning -PT/OT eval  Body mass index is 26.09 kg/m.         DVT prophylaxis:  SCDs Start: 02/09/20 0410  Code Status: Full  code Family Communication: Patient and/or RN. Available if any question.  Status is: Inpatient  Remains inpatient appropriate because:Ongoing active pain requiring inpatient pain management, Unsafe d/c plan, IV treatments appropriate due to intensity of illness or inability to take PO and Inpatient level of care appropriate due to severity of illness   Dispo: The patient is from: Home              Anticipated d/c is to: SNF              Anticipated d/c date is: 2 days              Patient currently is not medically stable to d/c.       Consultants:  None   Sch Meds:  Scheduled Meds: . acetaminophen  1,000 mg Oral Q8H  . atorvastatin  20 mg Oral q1800  . baclofen  10 mg Oral TID  . insulin aspart  0-15 Units Subcutaneous TID WC  . insulin glargine  20 Units Subcutaneous BID  . isosorbide-hydrALAZINE  1 tablet Oral TID  . lidocaine  1 patch Transdermal Q24H  . metoprolol tartrate  75 mg Oral BID   Continuous Infusions: PRN Meds:.morphine injection, ondansetron (ZOFRAN) IV, oxyCODONE, polyethylene glycol, senna-docusate  Antimicrobials: Anti-infectives (From admission, onward)   None       I have personally reviewed the following labs and images: CBC: Recent Labs  Lab 02/04/20 1143 02/05/20 0313 02/08/20 2218 02/09/20 0858  WBC 7.9 7.9 11.4* 9.3  NEUTROABS  --   --  4.8  --   HGB 11.3* 11.8* 10.6* 10.7*  HCT 36.9 37.5 34.0* 34.0*  MCV 81.8 80.3 81.3 80.4  PLT 299 301 310 293   BMP &GFR Recent Labs  Lab 02/05/20 0313 02/06/20 0219 02/07/20 0312 02/08/20 2218 02/09/20 0858  NA 141 142 141 141 138  K 3.7 4.1 3.9 4.1 3.8  CL 104 109 111 108 107  CO2 25 25 21* 19* 18*  GLUCOSE 65* 70 123* 131* 134*  BUN 25* 23 19 25* 19  CREATININE 1.61* 1.50* 1.55* 1.80* 1.41*  CALCIUM 9.4 9.1 8.7* 9.1 9.0  MG  --   --   --   --  1.9  PHOS  --   --   --   --  3.4   Estimated Creatinine Clearance: 25.3 mL/min (A) (by C-G formula based on SCr of 1.41 mg/dL  (H)). Liver & Pancreas: Recent Labs  Lab 02/05/20 0313 02/08/20 2218 02/09/20 0858  AST 19 28  --   ALT 14 21  --   ALKPHOS 83 81  --   BILITOT 1.0 1.1  --   PROT 7.1 7.0  --   ALBUMIN 3.5 3.6 3.4*   Recent Labs  Lab 02/08/20 2218  LIPASE 29   No results for input(s): AMMONIA in the last 168 hours. Diabetic: Recent Labs    02/09/20 0858  HGBA1C 7.6*   Recent Labs  Lab 02/06/20 2122 02/07/20 0654 02/07/20 1116 02/09/20 0704 02/09/20 1134  GLUCAP 144* 109* 207* 117* 166*   Cardiac Enzymes: No results for input(s): CKTOTAL, CKMB, CKMBINDEX,  TROPONINI in the last 168 hours. No results for input(s): PROBNP in the last 8760 hours. Coagulation Profile: No results for input(s): INR, PROTIME in the last 168 hours. Thyroid Function Tests: No results for input(s): TSH, T4TOTAL, FREET4, T3FREE, THYROIDAB in the last 72 hours. Lipid Profile: No results for input(s): CHOL, HDL, LDLCALC, TRIG, CHOLHDL, LDLDIRECT in the last 72 hours. Anemia Panel: No results for input(s): VITAMINB12, FOLATE, FERRITIN, TIBC, IRON, RETICCTPCT in the last 72 hours. Urine analysis:    Component Value Date/Time   COLORURINE STRAW (A) 02/04/2020 2005   APPEARANCEUR CLEAR 02/04/2020 2005   LABSPEC 1.008 02/04/2020 2005   PHURINE 5.0 02/04/2020 2005   GLUCOSEU NEGATIVE 02/04/2020 2005   HGBUR NEGATIVE 02/04/2020 2005   BILIRUBINUR NEGATIVE 02/04/2020 2005   KETONESUR NEGATIVE 02/04/2020 2005   PROTEINUR NEGATIVE 02/04/2020 2005   UROBILINOGEN 0.2 05/15/2014 0858   NITRITE NEGATIVE 02/04/2020 2005   LEUKOCYTESUR TRACE (A) 02/04/2020 2005   Sepsis Labs: Invalid input(s): PROCALCITONIN, LACTICIDVEN  Microbiology: Recent Results (from the past 240 hour(s))  Respiratory Panel by RT PCR (Flu A&B, Covid) - Nasopharyngeal Swab     Status: None   Collection Time: 02/04/20 12:58 PM   Specimen: Nasopharyngeal Swab  Result Value Ref Range Status   SARS Coronavirus 2 by RT PCR NEGATIVE NEGATIVE  Final    Comment: (NOTE) SARS-CoV-2 target nucleic acids are NOT DETECTED.  The SARS-CoV-2 RNA is generally detectable in upper respiratoy specimens during the acute phase of infection. The lowest concentration of SARS-CoV-2 viral copies this assay can detect is 131 copies/mL. A negative result does not preclude SARS-Cov-2 infection and should not be used as the sole basis for treatment or other patient management decisions. A negative result may occur with  improper specimen collection/handling, submission of specimen other than nasopharyngeal swab, presence of viral mutation(s) within the areas targeted by this assay, and inadequate number of viral copies (<131 copies/mL). A negative result must be combined with clinical observations, patient history, and epidemiological information. The expected result is Negative.  Fact Sheet for Patients:  https://www.moore.com/  Fact Sheet for Healthcare Providers:  https://www.young.biz/  This test is no t yet approved or cleared by the [place] FDA and  has been authorized for detection and/or diagnosis of SARS-CoV-2 by FDA under an Emergency Use Authorization (EUA). This EUA will remain  in effect (meaning this test can be used) for the duration of the COVID-19 declaration under Section 564(b)(1) of the Act, 21 U.S.C. section 360bbb-3(b)(1), unless the authorization is terminated or revoked sooner.     Influenza A by PCR NEGATIVE NEGATIVE Final   Influenza B by PCR NEGATIVE NEGATIVE Final    Comment: (NOTE) The Xpert Xpress SARS-CoV-2/FLU/RSV assay is intended as an aid in  the diagnosis of influenza from Nasopharyngeal swab specimens and  should not be used as a sole basis for treatment. Nasal washings and  aspirates are unacceptable for Xpert Xpress SARS-CoV-2/FLU/RSV  testing.  Fact Sheet for Patients: https://www.moore.com/  Fact Sheet for Healthcare  Providers: https://www.young.biz/  This test is not yet approved or cleared by the [place] FDA and  has been authorized for detection and/or diagnosis of SARS-CoV-2 by  FDA under an Emergency Use Authorization (EUA). This EUA will remain  in effect (meaning this test can be used) for the duration of the  Covid-19 declaration under Section 564(b)(1) of the Act, 21  U.S.C. section 360bbb-3(b)(1), unless the authorization is  terminated or revoked. Performed at [facility],  [address], [place], [place] [address]   Urine culture     Status: Abnormal   Collection Time: 02/04/20  8:12 PM   Specimen: Urine, Random  Result Value Ref Range Status   Specimen Description URINE, RANDOM  Final   Special Requests   Final    NONE Performed at [facility], [address], [place], [place] [address]    Culture MULTIPLE SPECIES PRESENT, SUGGEST RECOLLECTION (A)  Final   Report Status 02/06/2020 FINAL  Final  Respiratory Panel by RT PCR (Flu A&B, Covid) - Nasopharyngeal Swab     Status: None   Collection Time: 02/08/20 11:19 PM   Specimen: Nasopharyngeal Swab  Result Value Ref Range Status   SARS Coronavirus 2 by RT PCR NEGATIVE NEGATIVE Final    Comment: (NOTE) SARS-CoV-2 target nucleic acids are NOT DETECTED.  The SARS-CoV-2 RNA is generally detectable in upper respiratoy specimens during the acute phase of infection. The lowest concentration of SARS-CoV-2 viral copies this assay can detect is 131 copies/mL. A negative result does not preclude SARS-Cov-2 infection and should not be used as the sole basis for treatment or other patient management decisions. A negative result may occur with  improper specimen collection/handling, submission of specimen other than nasopharyngeal swab, presence of viral mutation(s) within the areas targeted by this assay, and inadequate number of viral copies (<131 copies/mL). A negative result must be combined with  clinical observations, patient history, and epidemiological information. The expected result is Negative.  Fact Sheet for Patients:  https://www.moore.com/  Fact Sheet for Healthcare Providers:  https://www.young.biz/  This test is no t yet approved or cleared by the [place] FDA and  has been authorized for detection and/or diagnosis of SARS-CoV-2 by FDA under an Emergency Use Authorization (EUA). This EUA will remain  in effect (meaning this test can be used) for the duration of the COVID-19 declaration under Section 564(b)(1) of the Act, 21 U.S.C. section 360bbb-3(b)(1), unless the authorization is terminated or revoked sooner.     Influenza A by PCR NEGATIVE NEGATIVE Final   Influenza B by PCR NEGATIVE NEGATIVE Final    Comment: (NOTE) The Xpert Xpress SARS-CoV-2/FLU/RSV assay is intended as an aid in  the diagnosis of influenza from Nasopharyngeal swab specimens and  should not be used as a sole basis for treatment. Nasal washings and  aspirates are unacceptable for Xpert Xpress SARS-CoV-2/FLU/RSV  testing.  Fact Sheet for Patients: https://www.moore.com/  Fact Sheet for Healthcare Providers: https://www.young.biz/  This test is not yet approved or cleared by the [place] FDA and  has been authorized for detection and/or diagnosis of SARS-CoV-2 by  FDA under an Emergency Use Authorization (EUA). This EUA will remain  in effect (meaning this test can be used) for the duration of the  Covid-19 declaration under Section 564(b)(1) of the Act, 21  U.S.C. section 360bbb-3(b)(1), unless the authorization is  terminated or revoked. Performed at [facility], [address] [place], [place] [address]     Radiology Studies: CT Abdomen Pelvis Wo Contrast  Result Date: 02/08/2020 CLINICAL DATA:  Trauma, fall, struck back on wall of bathtub, left abdominal pain and low back pain  EXAM: CT ABDOMEN AND PELVIS WITHOUT CONTRAST TECHNIQUE: Multidetector CT imaging of the abdomen and pelvis was performed following the standard protocol without IV contrast. COMPARISON:  Contemporary lumbar spine reconstructions. FINDINGS: Lower chest: Mid subpleural reticular opacities present in both lung bases could reflect some interstitial reticular change or atelectatic  features similar to comparison CT 1 day prior. Some mild basilar bronchiectatic features. No consolidation or pleural effusion. Cardiomegaly. Hypoattenuation of the cardiac blood pool may reflect some mild anemia. Three-vessel coronary artery atherosclerosis as well as calcifications upon the aortic leaflets. No pericardial effusion. Distal thoracic aortic atherosclerosis. Hepatobiliary: No direct liver injury or perihepatic hemorrhage is seen. No worrisome focal liver lesions within the limitations of this unenhanced CT. Smooth liver surface contour. Normal liver attenuation. Prior cholecystectomy. No significant biliary dilatation or visible intraductal gallstones. Pancreas: No definite pancreatic contusion or ductal disruption multiple air lucencies about the head of the pancreas correspond to extensive duodenal diverticular disease including several larger air and fluid-filled diverticula present on comparison as remote as 2009. Moderate pancreatic atrophy. No focal peripancreatic inflammation or ductal dilatation. Spleen: No direct splenic injury or perisplenic hematoma. Slightly diminutive size of the spleen with likely vascular calcification. No concerning splenic lesion. Adrenals/Urinary Tract: No suspicious adrenal lesion or dominant nodule nor evidence of adrenal hemorrhage. Some mild lobular thickening can reflect some senescent adrenal hyperplasia. Mild symmetric bilateral perinephric stranding, a nonspecific finding which may correlate with advanced age or decreased renal function. Few capsular calcifications seen in the inferior  pole right kidney may reflect post infectious or inflammatory change. No concerning focal renal lesion. No urolithiasis or hydronephrosis. Slightly increased attenuation of the urinary bladder may reflect recent contrast administration for angiography performed 02/06/2020. Urinary bladder is otherwise unremarkable. Stomach/Bowel: Distal esophagus and stomach are unremarkable. Extensive duodenal diverticulosis with larger air and fluid-filled diverticula adjacent the pancreatic head and uncinate are similar to comparison. Duodenum with a normal sweep across the midline abdomen. No small bowel wall thickening or dilatation. A normal appendix is visualized. No colonic dilatation or wall thickening. Scattered colonic diverticula without focal inflammation to suggest diverticulitis. No evidence of obstruction. No evidence of mesenteric hematoma or contusion. Vascular/Lymphatic: Atherosclerotic calcifications within the abdominal aorta and branch vessels. No aneurysm or ectasia. No enlarged abdominopelvic lymph nodes. Reproductive: Anteverted uterus with parametrial calcification and atrophic appearance of the ovaries is age-appropriate. No concerning uterine or adnexal lesions. Other: No abdominopelvic free air or fluid. No large body wall hematoma. Subcutaneous nodularity along the low anterior are abdominal wall on the left likely related to injectable use. Posterior body wall edema. No bowel containing hernia or traumatic abdominal wall dehiscence. Musculoskeletal: Dedicated lumbar reconstructions generated and dictated separately, please see report for further details. In brief, there are displaced left L1 through L4 transverse process fractures. Additionally, there minimally displaced fractures of posterolateral left tenth and eleventh ribs. No other acute traumatic osseous injury of the abdomen or pelvis. Bony pelvis is intact. Femoral heads are normally located. Multilevel discogenic and facet degenerative changes  as well as degenerative changes of the SI joints and bilateral hips. No acute abnormality of the partially included right hand. IMPRESSION: 1. Unenhanced CT was performed per clinician order. Lack of IV contrast limits sensitivity and specificity, especially for evaluation of abdominal/pelvic solid viscera. 2. Displaced left L1 through L4 transverse process fractures. For further details of the lumbar spine, please see dedicated reconstructions which were generated from this exam and dictated separately. 3. Minimally displaced fractures of posterolateral left tenth and eleventh ribs. 4. No other acute traumatic osseous injury of the abdomen or pelvis. No solid organ, hollow viscus or vascular injury is evident. 5. Mild symmetric bilateral perinephric stranding, a nonspecific finding which may correlate with advanced age or decreased renal function. If urinary symptoms are present, consider urinalysis to  exclude infection. 6. Extensive duodenal diverticulosis with larger air and fluid-filled diverticula adjacent the pancreatic head and uncinate are similar to comparison. Colonic diverticulosis without evidence of diverticulitis. 7. Increased attenuation of the bladder contents likely related to intravenous contrast administration 02/06/2020. 8. Cardiomegaly with coronary artery atherosclerosis. 9. Hypoattenuation of the cardiac blood pool may reflect some mild anemia. 10. Subpleural reticular changes in the lung bases may reflect atelectasis or interstitial fibrosis. 11. Aortic Atherosclerosis (ICD10-I70.0). Electronically Signed   By: Kreg Shropshire M.D.   On: 02/08/2020 21:57   DG Thoracic Spine 2 View  Result Date: 02/08/2020 CLINICAL DATA:  Fall, back pain, known transverse process fractures in the lumbar spine. EXAM: THORACIC SPINE 2 VIEWS COMPARISON:  Same-day CT abdomen and pelvis, CT L-spine. CT angiography of the chest 02/06/2020, radiograph 09/01/2012 FINDINGS: The osseous structures appear diffusely  demineralized which may limit detection of small or nondisplaced fractures. Some slight anterior lower thoracic wedging about the T8, T9 levels are compatible with the progressive discogenic and facet degenerative changes seen on recent CT angiography of the chest when compared to more remote comparison radiography. Known left lumbar transverse process fractures and rib fractures are poorly visualized on this exam. Cholecystectomy clips present the right upper quadrant. Some coarsened interstitial changes are present in the lungs could reflect some mild edema as seen on comparison CT angiography chest. IMPRESSION: 1. Slight anterior thoracic wedging about the T8, T9 levels are compatible with the progressive discogenic and facet degenerative changes seen on recent CT angiography of the chest. However could correlate for point tenderness and if there is persisting concern, CT or MR imaging could be obtained to assess for interval injury given demineralization limiting detection of subtle fractures. 2. Known left lumbar transverse process fractures and rib fractures are poorly visualized on this exam. Electronically Signed   By: Kreg Shropshire M.D.   On: 02/08/2020 22:37   CT Head Wo Contrast  Result Date: 02/08/2020 CLINICAL DATA:  Fall last night landing on back, no direct head injury. Lower back pain and spasms. On anticoagulation EXAM: CT HEAD WITHOUT CONTRAST CT CERVICAL SPINE WITHOUT CONTRAST TECHNIQUE: Multidetector CT imaging of the head and cervical spine was performed following the standard protocol without intravenous contrast. Multiplanar CT image reconstructions of the cervical spine were also generated. COMPARISON:  CT head 04/18/2004, CT cervical spine 11/17/2015 FINDINGS: CT HEAD FINDINGS Brain: Few punctate scattered hypoattenuating foci in the bilateral basal ganglia may reflect areas of prior lacunar type infarct. Mineralization of the bilateral basal ganglia as well, often a benign senescent  finding. No evidence of acute infarction, hemorrhage, hydrocephalus, extra-axial collection, visible mass lesion or mass effect. Symmetric prominence of the ventricles, cisterns and sulci compatible with parenchymal volume loss. Patchy areas of white matter hypoattenuation are most compatible with chronic microvascular angiopathy. Vascular: Atherosclerotic calcification of the carotid siphons and intradural vertebral arteries. No hyperdense vessel. Left dominant vertebrobasilar system. Skull: No significant scalp swelling or hematoma. No calvarial fracture or acute osseous abnormality. Sinuses/Orbits: Minimal thickening in the ethmoids. Remaining paranasal sinuses and mastoid air cells are predominantly clear. No layering air-fluid levels or pneumatized secretions. Orbital structures are unremarkable aside from prior lens extractions. Other: Moderate bilateral TMJ arthrosis. Edentulous with some mild mandibular prognathism. CT CERVICAL SPINE FINDINGS Alignment: A stabilization collar is absent at the time of examination. There is straightening of the normal cervical lordosis. Slight stepwise retrolisthesis is present C3-C6 and with mild retrolisthesis C6 on C7 and 2 mm anterolisthesis C7 on T1.  These findings are similar to comparison radiography and favored to be on a degenerative basis with multilevel spondylitic changes. No evidence of traumatic listhesis. No abnormally widened, perched or jumped facets. Normal alignment of the craniocervical and atlantoaxial articulations. Skull base and vertebrae: No acute skull base fracture. No vertebral body fracture or height loss. The osseous structures appear diffusely demineralized which may limit detection of small or nondisplaced fractures. Moderate arthrosis at the atlantodental and basion dens interval with degenerative spurring. Multilevel cervical spondylitic changes are present as well, better detailed below. Soft tissues and spinal canal: No pre or paravertebral  fluid or swelling. No visible canal hematoma. Disc levels: Multilevel intervertebral disc height loss with spondylitic endplate changes. Slightly larger disc osteophyte complexes present at C6-7 which in combination with some posterior ligamentum flavum infolding in on left facet hypertrophic change result in moderate canal stenosis at this level. No other significant canal narrowing. Multilevel uncinate spurring and facet hypertrophic changes elsewhere result in mild-to-moderate multilevel neural foraminal narrowing. Upper chest: No acute abnormality in the upper chest or imaged lung apices. Other: Atherosclerotic calcifications seen in the proximal great vessels. Cervical carotid atherosclerosis is present as well. Normal thyroid. IMPRESSION: 1. No acute intracranial abnormality. No significant scalp swelling or calvarial fracture. 2. Chronic microvascular ischemic changes, parenchymal volume loss and intracranial atherosclerosis. 3. No evidence of acute fracture or traumatic listhesis of the cervical spine. 4. Multilevel degenerative changes of the cervical spine, as described above. Features maximal at C6-7 with at most moderate canal stenosis and multilevel mild-to-moderate neural foraminal narrowing. Electronically Signed   By: Kreg Shropshire M.D.   On: 02/08/2020 21:34   CT Chest Wo Contrast  Addendum Date: 02/09/2020   ADDENDUM REPORT: 02/09/2020 04:52 ADDENDUM: There are nondisplaced fractures of the posterior left tenth and eleventh ribs. This was discussed with Dr. Jeraldine Loots at 4:45 a.m. on 02/09/2020. Electronically Signed   By: Deatra Robinson M.D.   On: 02/09/2020 04:52   Result Date: 02/09/2020 CLINICAL DATA:  Fall EXAM: CT THORACIC SPINE WITHOUT CONTRAST; CT CHEST WITHOUT CONTRAST TECHNIQUE: Multidetector CT images of the chest were obtained using the standard protocol without intravenous contrast. Dedicated thoracic spine images were reconstructed from the chest CT data. COMPARISON:  None.  FINDINGS: CT CHEST FINDINGS Cardiovascular: There is atherosclerotic calcification of the aorta with coronary artery calcifications as well. Mild cardiomegaly without pericardial effusion. Mediastinum/Nodes: No mediastinal, hilar or axillary lymphadenopathy. Normal visualized thyroid. Thoracic esophageal course is normal. Lungs/Pleura: 3 mm right upper lobe nodule (image 44). Multifocal scarring/atelectasis. No pleural effusion or pulmonary contusion. Upper abdomen: Unremarkable Musculoskeletal: No chest wall abnormality. No bony spinal canal stenosis. CT THORACIC SPINE FINDINGS Alignment: Normal. Vertebrae: No acute fracture or focal pathologic process. Disc levels: No spinal canal stenosis IMPRESSION: 1. No acute abnormality of the chest or thoracic spine. 2. 3 mm right upper lobe pulmonary nodule. No follow-up needed if patient is low-risk. Non-contrast chest CT can be considered in 12 months if patient is high-risk. This recommendation follows the consensus statement: Guidelines for Management of Incidental Pulmonary Nodules Detected on CT Images: From the Fleischner Society 2017; Radiology 2017; 284:228-243. Aortic Atherosclerosis (ICD10-I70.0). Electronically Signed: By: Deatra Robinson M.D. On: 02/09/2020 02:16   CT Cervical Spine Wo Contrast  Result Date: 02/08/2020 CLINICAL DATA:  Fall last night landing on back, no direct head injury. Lower back pain and spasms. On anticoagulation EXAM: CT HEAD WITHOUT CONTRAST CT CERVICAL SPINE WITHOUT CONTRAST TECHNIQUE: Multidetector CT imaging of the head and cervical  spine was performed following the standard protocol without intravenous contrast. Multiplanar CT image reconstructions of the cervical spine were also generated. COMPARISON:  CT head 04/18/2004, CT cervical spine 11/17/2015 FINDINGS: CT HEAD FINDINGS Brain: Few punctate scattered hypoattenuating foci in the bilateral basal ganglia may reflect areas of prior lacunar type infarct. Mineralization of the  bilateral basal ganglia as well, often a benign senescent finding. No evidence of acute infarction, hemorrhage, hydrocephalus, extra-axial collection, visible mass lesion or mass effect. Symmetric prominence of the ventricles, cisterns and sulci compatible with parenchymal volume loss. Patchy areas of white matter hypoattenuation are most compatible with chronic microvascular angiopathy. Vascular: Atherosclerotic calcification of the carotid siphons and intradural vertebral arteries. No hyperdense vessel. Left dominant vertebrobasilar system. Skull: No significant scalp swelling or hematoma. No calvarial fracture or acute osseous abnormality. Sinuses/Orbits: Minimal thickening in the ethmoids. Remaining paranasal sinuses and mastoid air cells are predominantly clear. No layering air-fluid levels or pneumatized secretions. Orbital structures are unremarkable aside from prior lens extractions. Other: Moderate bilateral TMJ arthrosis. Edentulous with some mild mandibular prognathism. CT CERVICAL SPINE FINDINGS Alignment: A stabilization collar is absent at the time of examination. There is straightening of the normal cervical lordosis. Slight stepwise retrolisthesis is present C3-C6 and with mild retrolisthesis C6 on C7 and 2 mm anterolisthesis C7 on T1. These findings are similar to comparison radiography and favored to be on a degenerative basis with multilevel spondylitic changes. No evidence of traumatic listhesis. No abnormally widened, perched or jumped facets. Normal alignment of the craniocervical and atlantoaxial articulations. Skull base and vertebrae: No acute skull base fracture. No vertebral body fracture or height loss. The osseous structures appear diffusely demineralized which may limit detection of small or nondisplaced fractures. Moderate arthrosis at the atlantodental and basion dens interval with degenerative spurring. Multilevel cervical spondylitic changes are present as well, better detailed  below. Soft tissues and spinal canal: No pre or paravertebral fluid or swelling. No visible canal hematoma. Disc levels: Multilevel intervertebral disc height loss with spondylitic endplate changes. Slightly larger disc osteophyte complexes present at C6-7 which in combination with some posterior ligamentum flavum infolding in on left facet hypertrophic change result in moderate canal stenosis at this level. No other significant canal narrowing. Multilevel uncinate spurring and facet hypertrophic changes elsewhere result in mild-to-moderate multilevel neural foraminal narrowing. Upper chest: No acute abnormality in the upper chest or imaged lung apices. Other: Atherosclerotic calcifications seen in the proximal great vessels. Cervical carotid atherosclerosis is present as well. Normal thyroid. IMPRESSION: 1. No acute intracranial abnormality. No significant scalp swelling or calvarial fracture. 2. Chronic microvascular ischemic changes, parenchymal volume loss and intracranial atherosclerosis. 3. No evidence of acute fracture or traumatic listhesis of the cervical spine. 4. Multilevel degenerative changes of the cervical spine, as described above. Features maximal at C6-7 with at most moderate canal stenosis and multilevel mild-to-moderate neural foraminal narrowing. Electronically Signed   By: Kreg Shropshire M.D.   On: 02/08/2020 21:34   CT T-SPINE NO CHARGE  Addendum Date: 02/09/2020   ADDENDUM REPORT: 02/09/2020 04:52 ADDENDUM: There are nondisplaced fractures of the posterior left tenth and eleventh ribs. This was discussed with Dr. Jeraldine Loots at 4:45 a.m. on 02/09/2020. Electronically Signed   By: Deatra Robinson M.D.   On: 02/09/2020 04:52   Result Date: 02/09/2020 CLINICAL DATA:  Fall EXAM: CT THORACIC SPINE WITHOUT CONTRAST; CT CHEST WITHOUT CONTRAST TECHNIQUE: Multidetector CT images of the chest were obtained using the standard protocol without intravenous contrast. Dedicated thoracic spine images  were  reconstructed from the chest CT data. COMPARISON:  None. FINDINGS: CT CHEST FINDINGS Cardiovascular: There is atherosclerotic calcification of the aorta with coronary artery calcifications as well. Mild cardiomegaly without pericardial effusion. Mediastinum/Nodes: No mediastinal, hilar or axillary lymphadenopathy. Normal visualized thyroid. Thoracic esophageal course is normal. Lungs/Pleura: 3 mm right upper lobe nodule (image 44). Multifocal scarring/atelectasis. No pleural effusion or pulmonary contusion. Upper abdomen: Unremarkable Musculoskeletal: No chest wall abnormality. No bony spinal canal stenosis. CT THORACIC SPINE FINDINGS Alignment: Normal. Vertebrae: No acute fracture or focal pathologic process. Disc levels: No spinal canal stenosis IMPRESSION: 1. No acute abnormality of the chest or thoracic spine. 2. 3 mm right upper lobe pulmonary nodule. No follow-up needed if patient is low-risk. Non-contrast chest CT can be considered in 12 months if patient is high-risk. This recommendation follows the consensus statement: Guidelines for Management of Incidental Pulmonary Nodules Detected on CT Images: From the Fleischner Society 2017; Radiology 2017; 284:228-243. Aortic Atherosclerosis (ICD10-I70.0). Electronically Signed: By: Deatra RobinsonKevin  Herman M.D. On: 02/09/2020 02:16   CT L-SPINE NO CHARGE  Result Date: 02/08/2020 CLINICAL DATA:  Fall, struck back with low back pain EXAM: CT LUMBAR SPINE WITHOUT CONTRAST TECHNIQUE: Multiplanar reconstructions of the lumbar spine were generated from the contemporary CT of the abdomen and pelvis. COMPARISON:  Contemporary CT abdomen and pelvis. FINDINGS: Segmentation: 5 normally formed lumbar type vertebral bodies. Lowest fully formed disc space denoted as L5-S1. Alignment: Mild straightening of normal lumbar lordosis. Minimal retrolisthesis of L4 on L5 of approximately 3 mm. Favored to be on a facet degenerative basis without spondylolysis. No abnormally widened, jumped or  perched facets. Vertebrae: No acute vertebral body fracture or height loss is seen. There are displaced fractures of the left L1 through L4 transverse processes. No other acute fracture or traumatic osseous injury is seen in the lumbar spine or included portions of the bony pelvis. 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 Multilevel discogenic and facet degenerative changes are present, detailed below level by level. 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 Additional degenerative changes in the bilateral SI joints and hips. Paraspinal and other soft tissues: Minimal soft tissue thickening seen adjacent the left L1-L4 transverse process fractures. No other acute paravertebral fluid, swelling, gas or hemorrhage. No visible canal hematoma within the limitations of this exam. Posterior body wall edema. For findings in the posterior abdomen and pelvis, please see dedicated CT from which this study is reconstructed. Disc levels: Level by level evaluation of the lumbar spine below: T11-T12: Disc desiccation and height loss with small global disc bulge and moderate facet arthropathy, left greater than right with severe left and moderate right neural foraminal narrowing. T12-L1: Mild disc height loss and bilateral facet arthropathy. Shallow global disc bulge. No significant spinal canal or foraminal stenosis. L1-L2: Moderate to severe disc height loss with desiccation and Schmorl's node formations and mild bilateral facet arthropathy. No significant canal stenosis. Mild left and moderate right foraminal narrowing. L2-L3: Severe disc height loss, desiccation and vacuum disc with moderate bilateral facet arthropathy, posterior ridging and global disc bulge. Moderate canal stenosis and mild bilateral foraminal narrowing. L3-L4: Mild disc height loss with desiccation, Schmorl's node formation and vacuum disc  phenomenon. Mild bilateral facet arthropathy. Shallow global disc bulge and ligamentum flavum infolding with at most mild canal stenosis but no significant neural foraminal narrowing. L4-L5: Retrolisthesis, mild disc height loss and moderate bilateral facet arthropathy, with shallow global disc  bulge and some mild ligamentum flavum infolding at most mild canal stenosis but with moderate to severe bilateral foraminal narrowing. L5-S1: Severe disc height loss, desiccation and vacuum disc as well as severe bilateral facet arthropathy. Shallow global disc bulge. No significant canal stenosis. Moderate to severe bilateral neural foraminal narrowing. IMPRESSION: 1. Displaced fractures of the left L1 through L4 transverse processes. 2. No other acute fracture or traumatic osseous injury is seen in the lumbar spine or included portions of the bony pelvis. 3. Diffuse bony demineralization may limit detection of subtle nondisplaced fractures. 4. Multilevel discogenic and facet degenerative changes as described above, with moderate canal stenosis at L2-L3 and mild canal stenosis at L3-L4 and L4-L5. Multilevel moderate to severe neural foraminal narrowing is present as well. 5. For findings in the posterior abdomen and pelvis, please see dedicated CT from which this study is reconstructed. Electronically Signed   By: Kreg Shropshire M.D.   On: 02/08/2020 21:44   Admission after midnight.  No charge for this note.   Carina Chaplin T. Yobany Vroom [facility]  If 7PM-7AM, please contact night-coverage [email or web address] 02/09/2020, 3:26 PM

## 2020-02-09 NOTE — ED Provider Notes (Signed)
Assumed care from PA [place] at shift change.  See prior note for full H&P.  Briefly, 86 year old female just discharged from the hospital yesterday after admission for AKI, here after a fall.  She had a mechanical fall in the bathroom and struck her back on the side of the bathtub.  Imaging thus far with 4 lumbar transverse process fractures and 2 rib fractures.  Case has been discussed with trauma service-- will get CT chest and if no further findings can admit to medicine.  Will get reconstituted images of the thoracic spine as plain films with questionable compression fracture.  Plan:  CT's pending, admission for pain control  Results for orders placed or performed during the hospital encounter of 02/08/20  Respiratory Panel by RT PCR (Flu A&B, Covid) - Nasopharyngeal Swab   Specimen: Nasopharyngeal Swab  Result Value Ref Range   SARS Coronavirus 2 by RT PCR NEGATIVE NEGATIVE   Influenza A by PCR NEGATIVE NEGATIVE   Influenza B by PCR NEGATIVE NEGATIVE  Comprehensive metabolic panel  Result Value Ref Range   Sodium 141 135 - 145 mmol/L   Potassium 4.1 3.5 - 5.1 mmol/L   Chloride 108 98 - 111 mmol/L   CO2 19 (L) 22 - 32 mmol/L   Glucose, Bld 131 (H) 70 - 99 mg/dL   BUN 25 (H) 8 - 23 mg/dL   Creatinine, Ser 0.48 (H) 0.44 - 1.00 mg/dL   Calcium 9.1 8.9 - 88.9 mg/dL   Total Protein 7.0 6.5 - 8.1 g/dL   Albumin 3.6 3.5 - 5.0 g/dL   AST 28 15 - 41 U/L   ALT 21 0 - 44 U/L   Alkaline Phosphatase 81 38 - 126 U/L   Total Bilirubin 1.1 0.3 - 1.2 mg/dL   GFR, Estimated 26 (L) >60 mL/min   Anion gap 14 5 - 15  CBC with Differential  Result Value Ref Range   WBC 11.4 (H) 4.0 - 10.5 K/uL   RBC 4.18 3.87 - 5.11 MIL/uL   Hemoglobin 10.6 (L) 12.0 - 15.0 g/dL   HCT 16.9 (L) 36 - 46 %   MCV 81.3 80.0 - 100.0 fL   MCH 25.4 (L) 26.0 - 34.0 pg   MCHC 31.2 30.0 - 36.0 g/dL   RDW 45.0 38.8 - 82.8 %   Platelets 310 150 - 400 K/uL   nRBC 0.0 0.0 - 0.2 %   Neutrophils Relative % 42 %   Neutro Abs  4.8 1.7 - 7.7 K/uL   Lymphocytes Relative 47 %   Lymphs Abs 5.3 (H) 0.7 - 4.0 K/uL   Monocytes Relative 9 %   Monocytes Absolute 1.1 (H) 0.1 - 1.0 K/uL   Eosinophils Relative 2 %   Eosinophils Absolute 0.2 0.0 - 0.5 K/uL   Basophils Relative 0 %   Basophils Absolute 0.0 0.0 - 0.1 K/uL   Immature Granulocytes 0 %   Abs Immature Granulocytes 0.05 0.00 - 0.07 K/uL  Lipase, blood  Result Value Ref Range   Lipase 29 11 - 51 U/L   CT Abdomen Pelvis Wo Contrast  Result Date: 02/08/2020 CLINICAL DATA:  Trauma, fall, struck back on wall of bathtub, left abdominal pain and low back pain EXAM: CT ABDOMEN AND PELVIS WITHOUT CONTRAST TECHNIQUE: Multidetector CT imaging of the abdomen and pelvis was performed following the standard protocol without IV contrast. COMPARISON:  Contemporary lumbar spine reconstructions. FINDINGS: Lower chest: Mid subpleural reticular opacities present in both lung bases could reflect some interstitial reticular  change or atelectatic features similar to comparison CT 1 day prior. Some mild basilar bronchiectatic features. No consolidation or pleural effusion. Cardiomegaly. Hypoattenuation of the cardiac blood pool may reflect some mild anemia. Three-vessel coronary artery atherosclerosis as well as calcifications upon the aortic leaflets. No pericardial effusion. Distal thoracic aortic atherosclerosis. Hepatobiliary: No direct liver injury or perihepatic hemorrhage is seen. No worrisome focal liver lesions within the limitations of this unenhanced CT. Smooth liver surface contour. Normal liver attenuation. Prior cholecystectomy. No significant biliary dilatation or visible intraductal gallstones. Pancreas: No definite pancreatic contusion or ductal disruption multiple air lucencies about the head of the pancreas correspond to extensive duodenal diverticular disease including several larger air and fluid-filled diverticula present on comparison as remote as 2009. Moderate pancreatic  atrophy. No focal peripancreatic inflammation or ductal dilatation. Spleen: No direct splenic injury or perisplenic hematoma. Slightly diminutive size of the spleen with likely vascular calcification. No concerning splenic lesion. Adrenals/Urinary Tract: No suspicious adrenal lesion or dominant nodule nor evidence of adrenal hemorrhage. Some mild lobular thickening can reflect some senescent adrenal hyperplasia. Mild symmetric bilateral perinephric stranding, a nonspecific finding which may correlate with advanced age or decreased renal function. Few capsular calcifications seen in the inferior pole right kidney may reflect post infectious or inflammatory change. No concerning focal renal lesion. No urolithiasis or hydronephrosis. Slightly increased attenuation of the urinary bladder may reflect recent contrast administration for angiography performed 02/06/2020. Urinary bladder is otherwise unremarkable. Stomach/Bowel: Distal esophagus and stomach are unremarkable. Extensive duodenal diverticulosis with larger air and fluid-filled diverticula adjacent the pancreatic head and uncinate are similar to comparison. Duodenum with a normal sweep across the midline abdomen. No small bowel wall thickening or dilatation. A normal appendix is visualized. No colonic dilatation or wall thickening. Scattered colonic diverticula without focal inflammation to suggest diverticulitis. No evidence of obstruction. No evidence of mesenteric hematoma or contusion. Vascular/Lymphatic: Atherosclerotic calcifications within the abdominal aorta and branch vessels. No aneurysm or ectasia. No enlarged abdominopelvic lymph nodes. Reproductive: Anteverted uterus with parametrial calcification and atrophic appearance of the ovaries is age-appropriate. No concerning uterine or adnexal lesions. Other: No abdominopelvic free air or fluid. No large body wall hematoma. Subcutaneous nodularity along the low anterior are abdominal wall on the left  likely related to injectable use. Posterior body wall edema. No bowel containing hernia or traumatic abdominal wall dehiscence. Musculoskeletal: Dedicated lumbar reconstructions generated and dictated separately, please see report for further details. In brief, there are displaced left L1 through L4 transverse process fractures. Additionally, there minimally displaced fractures of posterolateral left tenth and eleventh ribs. No other acute traumatic osseous injury of the abdomen or pelvis. Bony pelvis is intact. Femoral heads are normally located. Multilevel discogenic and facet degenerative changes as well as degenerative changes of the SI joints and bilateral hips. No acute abnormality of the partially included right hand. IMPRESSION: 1. Unenhanced CT was performed per clinician order. Lack of IV contrast limits sensitivity and specificity, especially for evaluation of abdominal/pelvic solid viscera. 2. Displaced left L1 through L4 transverse process fractures. For further details of the lumbar spine, please see dedicated reconstructions which were generated from this exam and dictated separately. 3. Minimally displaced fractures of posterolateral left tenth and eleventh ribs. 4. No other acute traumatic osseous injury of the abdomen or pelvis. No solid organ, hollow viscus or vascular injury is evident. 5. Mild symmetric bilateral perinephric stranding, a nonspecific finding which may correlate with advanced age or decreased renal function. If urinary symptoms are present,  consider urinalysis to exclude infection. 6. Extensive duodenal diverticulosis with larger air and fluid-filled diverticula adjacent the pancreatic head and uncinate are similar to comparison. Colonic diverticulosis without evidence of diverticulitis. 7. Increased attenuation of the bladder contents likely related to intravenous contrast administration 02/06/2020. 8. Cardiomegaly with coronary artery atherosclerosis. 9. Hypoattenuation of the  cardiac blood pool may reflect some mild anemia. 10. Subpleural reticular changes in the lung bases may reflect atelectasis or interstitial fibrosis. 11. Aortic Atherosclerosis (ICD10-I70.0). Electronically Signed   By: Kreg Shropshire M.D.   On: 02/08/2020 21:57   DG Chest 2 View  Result Date: 02/07/2020 CLINICAL DATA:  Shortness of breath EXAM: CHEST - 2 VIEW COMPARISON:  02/04/2020 FINDINGS: Unchanged appearance of the chest with mild interstitial pulmonary edema and mild cardiomegaly. No focal airspace consolidation. No pleural effusion. IMPRESSION: Unchanged appearance of mild interstitial pulmonary edema. Electronically Signed   By: Deatra Robinson M.D.   On: 02/07/2020 20:12   DG Chest 2 View  Result Date: 02/04/2020 CLINICAL DATA:  Shortness of breath EXAM: CHEST - 2 VIEW COMPARISON:  04/11/2019 FINDINGS: Chronic interstitial prominence. No new consolidation or edema. No pleural effusion or pneumothorax. Stable cardiomediastinal contours with mild cardiomegaly. No acute osseous abnormality. Bilateral reverse shoulder arthroplasties. IMPRESSION: No acute process in the chest. Chronic interstitial changes and stable cardiomegaly. Electronically Signed   By: Guadlupe Spanish M.D.   On: 02/04/2020 11:59   DG Thoracic Spine 2 View  Result Date: 02/08/2020 CLINICAL DATA:  Fall, back pain, known transverse process fractures in the lumbar spine. EXAM: THORACIC SPINE 2 VIEWS COMPARISON:  Same-day CT abdomen and pelvis, CT L-spine. CT angiography of the chest 02/06/2020, radiograph 09/01/2012 FINDINGS: The osseous structures appear diffusely demineralized which may limit detection of small or nondisplaced fractures. Some slight anterior lower thoracic wedging about the T8, T9 levels are compatible with the progressive discogenic and facet degenerative changes seen on recent CT angiography of the chest when compared to more remote comparison radiography. Known left lumbar transverse process fractures and rib  fractures are poorly visualized on this exam. Cholecystectomy clips present the right upper quadrant. Some coarsened interstitial changes are present in the lungs could reflect some mild edema as seen on comparison CT angiography chest. IMPRESSION: 1. Slight anterior thoracic wedging about the T8, T9 levels are compatible with the progressive discogenic and facet degenerative changes seen on recent CT angiography of the chest. However could correlate for point tenderness and if there is persisting concern, CT or MR imaging could be obtained to assess for interval injury given demineralization limiting detection of subtle fractures. 2. Known left lumbar transverse process fractures and rib fractures are poorly visualized on this exam. Electronically Signed   By: Kreg Shropshire M.D.   On: 02/08/2020 22:37   CT Head Wo Contrast  Result Date: 02/08/2020 CLINICAL DATA:  Fall last night landing on back, no direct head injury. Lower back pain and spasms. On anticoagulation EXAM: CT HEAD WITHOUT CONTRAST CT CERVICAL SPINE WITHOUT CONTRAST TECHNIQUE: Multidetector CT imaging of the head and cervical spine was performed following the standard protocol without intravenous contrast. Multiplanar CT image reconstructions of the cervical spine were also generated. COMPARISON:  CT head 04/18/2004, CT cervical spine 11/17/2015 FINDINGS: CT HEAD FINDINGS Brain: Few punctate scattered hypoattenuating foci in the bilateral basal ganglia may reflect areas of prior lacunar type infarct. Mineralization of the bilateral basal ganglia as well, often a benign senescent finding. No evidence of acute infarction, hemorrhage, hydrocephalus, extra-axial collection, visible  mass lesion or mass effect. Symmetric prominence of the ventricles, cisterns and sulci compatible with parenchymal volume loss. Patchy areas of white matter hypoattenuation are most compatible with chronic microvascular angiopathy. Vascular: Atherosclerotic calcification of  the carotid siphons and intradural vertebral arteries. No hyperdense vessel. Left dominant vertebrobasilar system. Skull: No significant scalp swelling or hematoma. No calvarial fracture or acute osseous abnormality. Sinuses/Orbits: Minimal thickening in the ethmoids. Remaining paranasal sinuses and mastoid air cells are predominantly clear. No layering air-fluid levels or pneumatized secretions. Orbital structures are unremarkable aside from prior lens extractions. Other: Moderate bilateral TMJ arthrosis. Edentulous with some mild mandibular prognathism. CT CERVICAL SPINE FINDINGS Alignment: A stabilization collar is absent at the time of examination. There is straightening of the normal cervical lordosis. Slight stepwise retrolisthesis is present C3-C6 and with mild retrolisthesis C6 on C7 and 2 mm anterolisthesis C7 on T1. These findings are similar to comparison radiography and favored to be on a degenerative basis with multilevel spondylitic changes. No evidence of traumatic listhesis. No abnormally widened, perched or jumped facets. Normal alignment of the craniocervical and atlantoaxial articulations. Skull base and vertebrae: No acute skull base fracture. No vertebral body fracture or height loss. The osseous structures appear diffusely demineralized which may limit detection of small or nondisplaced fractures. Moderate arthrosis at the atlantodental and basion dens interval with degenerative spurring. Multilevel cervical spondylitic changes are present as well, better detailed below. Soft tissues and spinal canal: No pre or paravertebral fluid or swelling. No visible canal hematoma. Disc levels: Multilevel intervertebral disc height loss with spondylitic endplate changes. Slightly larger disc osteophyte complexes present at C6-7 which in combination with some posterior ligamentum flavum infolding in on left facet hypertrophic change result in moderate canal stenosis at this level. No other significant canal  narrowing. Multilevel uncinate spurring and facet hypertrophic changes elsewhere result in mild-to-moderate multilevel neural foraminal narrowing. Upper chest: No acute abnormality in the upper chest or imaged lung apices. Other: Atherosclerotic calcifications seen in the proximal great vessels. Cervical carotid atherosclerosis is present as well. Normal thyroid. IMPRESSION: 1. No acute intracranial abnormality. No significant scalp swelling or calvarial fracture. 2. Chronic microvascular ischemic changes, parenchymal volume loss and intracranial atherosclerosis. 3. No evidence of acute fracture or traumatic listhesis of the cervical spine. 4. Multilevel degenerative changes of the cervical spine, as described above. Features maximal at C6-7 with at most moderate canal stenosis and multilevel mild-to-moderate neural foraminal narrowing. Electronically Signed   By: Kreg ShropshirePrice  DeHay M.D.   On: 02/08/2020 21:34   CT Chest Wo Contrast  Result Date: 02/09/2020 CLINICAL DATA:  Fall EXAM: CT THORACIC SPINE WITHOUT CONTRAST; CT CHEST WITHOUT CONTRAST TECHNIQUE: Multidetector CT images of the chest were obtained using the standard protocol without intravenous contrast. Dedicated thoracic spine images were reconstructed from the chest CT data. COMPARISON:  None. FINDINGS: CT CHEST FINDINGS Cardiovascular: There is atherosclerotic calcification of the aorta with coronary artery calcifications as well. Mild cardiomegaly without pericardial effusion. Mediastinum/Nodes: No mediastinal, hilar or axillary lymphadenopathy. Normal visualized thyroid. Thoracic esophageal course is normal. Lungs/Pleura: 3 mm right upper lobe nodule (image 44). Multifocal scarring/atelectasis. No pleural effusion or pulmonary contusion. Upper abdomen: Unremarkable Musculoskeletal: No chest wall abnormality. No bony spinal canal stenosis. CT THORACIC SPINE FINDINGS Alignment: Normal. Vertebrae: No acute fracture or focal pathologic process. Disc levels:  No spinal canal stenosis IMPRESSION: 1. No acute abnormality of the chest or thoracic spine. 2. 3 mm right upper lobe pulmonary nodule. No follow-up needed if patient is low-risk.  Non-contrast chest CT can be considered in 12 months if patient is high-risk. This recommendation follows the consensus statement: Guidelines for Management of Incidental Pulmonary Nodules Detected on CT Images: From the Fleischner Society 2017; Radiology 2017; 284:228-243. Aortic Atherosclerosis (ICD10-I70.0). Electronically Signed   By: Deatra Robinson M.D.   On: 02/09/2020 02:16   CT ANGIO CHEST PE W OR WO CONTRAST  Result Date: 02/06/2020 CLINICAL DATA:  Positive D-dimer with shortness of breath EXAM: CT ANGIOGRAPHY CHEST WITH CONTRAST TECHNIQUE: Multidetector CT imaging of the chest was performed using the standard protocol during bolus administration of intravenous contrast. Multiplanar CT image reconstructions and MIPs were obtained to evaluate the vascular anatomy. CONTRAST:  63mL OMNIPAQUE IOHEXOL 350 MG/ML SOLN COMPARISON:  Chest radiograph February 04, 2020; nuclear medicine perfusion lung scan February 05, 2020 FINDINGS: Cardiovascular: There is no demonstrable pulmonary embolus. No thoracic aortic aneurysm. No dissection evident. Note that contrast bolus in the aorta is less than optimal for potential dissection assessment. There are scattered foci of great vessel calcification. There are foci of aortic atherosclerosis as well as multiple foci of coronary artery calcification. There is no pericardial effusion or pericardial thickening. Heart is mildly enlarged. Mediastinum/Nodes: Thyroid appears unremarkable. There are scattered subcentimeter mediastinal lymph nodes. No adenopathy by size criteria evident on this study. There are no appreciable esophageal lesions. Lungs/Pleura: There are areas of scattered bullous disease in the lung bases, more on the right than on the left. There is mild interstitial edema in the lung bases  with areas of atelectasis. Ill-defined airspace opacity in the left upper lobe is felt to represent a combination of atelectasis and superimposed focal pneumonia. On axial slice 47 series 6, there is a 3 mm nodular opacity in the anterior segment of the right upper lobe. On axial slice 54 series 6, there is a 7 x 4 mm nodular opacity in the anterior segment of the left upper lobe. On axial slice 66 series 6, there is a 3 mm nodular opacity in the anterior segment of the left upper lobe. No appreciable pleural effusions. Upper Abdomen: There is reflux of contrast into the inferior vena cava and hepatic veins. Gallbladder is absent. There are foci of aortic and proximal superior mesenteric artery calcification. Visualized upper abdominal structures otherwise normal. Musculoskeletal: Total shoulder replacements noted bilaterally. Degenerative change noted in the thoracic spine. No blastic or lytic bone lesions are evident. No chest wall lesions. Review of the MIP images confirms the above findings. IMPRESSION: 1. No demonstrable pulmonary embolus. No thoracic aortic aneurysm. No dissection evident. Note that the contrast bolus in the aorta is less than optimal for potential dissection assessment. There is aortic atherosclerosis as well as foci of great vessel and coronary artery calcification. Mild cardiomegaly. 2. Underlying emphysematous change with scattered areas of atelectasis. Mild bibasilar interstitial edema coupled with cardiomegaly may indicate a degree of congestive heart failure. No pleural effusions. Area of patchy airspace opacity in the left upper lobe anteriorly may represent a small focus of pneumonia, with atelectasis. 3. Several small nodular appearing opacities. Largest nodular opacity measures 7 x 4 mm in the anterior segment left upper lobe. Non-contrast chest CT at 6-12 months is recommended. If the nodule is stable at time of repeat CT, then future CT at 18-24 months (from today's scan) is  considered optional for low-risk patients, but is recommended for high-risk patients. This recommendation follows the consensus statement: Guidelines for Management of Incidental Pulmonary Nodules Detected on CT Images: From the Fleischner Society  2017; Radiology 2017; [id number]. 4.  No evident adenopathy. 5. Reflux of contrast into the inferior vena cava and hepatic veins may be indicative of a degree of increase in right heart pressure. 6.  Gallbladder absent.  Total shoulder replacements bilaterally. Aortic Atherosclerosis (ICD10-I70.0) and Emphysema (ICD10-J43.9). Electronically Signed   By: Bretta Bang III M.D.   On: 02/06/2020 13:53   CT Cervical Spine Wo Contrast  Result Date: 02/08/2020 CLINICAL DATA:  Fall last night landing on back, no direct head injury. Lower back pain and spasms. On anticoagulation EXAM: CT HEAD WITHOUT CONTRAST CT CERVICAL SPINE WITHOUT CONTRAST TECHNIQUE: Multidetector CT imaging of the head and cervical spine was performed following the standard protocol without intravenous contrast. Multiplanar CT image reconstructions of the cervical spine were also generated. COMPARISON:  CT head 04/18/2004, CT cervical spine 11/17/2015 FINDINGS: CT HEAD FINDINGS Brain: Few punctate scattered hypoattenuating foci in the bilateral basal ganglia may reflect areas of prior lacunar type infarct. Mineralization of the bilateral basal ganglia as well, often a benign senescent finding. No evidence of acute infarction, hemorrhage, hydrocephalus, extra-axial collection, visible mass lesion or mass effect. Symmetric prominence of the ventricles, cisterns and sulci compatible with parenchymal volume loss. Patchy areas of white matter hypoattenuation are most compatible with chronic microvascular angiopathy. Vascular: Atherosclerotic calcification of the carotid siphons and intradural vertebral arteries. No hyperdense vessel. Left dominant vertebrobasilar system. Skull: No significant scalp  swelling or hematoma. No calvarial fracture or acute osseous abnormality. Sinuses/Orbits: Minimal thickening in the ethmoids. Remaining paranasal sinuses and mastoid air cells are predominantly clear. No layering air-fluid levels or pneumatized secretions. Orbital structures are unremarkable aside from prior lens extractions. Other: Moderate bilateral TMJ arthrosis. Edentulous with some mild mandibular prognathism. CT CERVICAL SPINE FINDINGS Alignment: A stabilization collar is absent at the time of examination. There is straightening of the normal cervical lordosis. Slight stepwise retrolisthesis is present C3-C6 and with mild retrolisthesis C6 on C7 and 2 mm anterolisthesis C7 on T1. These findings are similar to comparison radiography and favored to be on a degenerative basis with multilevel spondylitic changes. No evidence of traumatic listhesis. No abnormally widened, perched or jumped facets. Normal alignment of the craniocervical and atlantoaxial articulations. Skull base and vertebrae: No acute skull base fracture. No vertebral body fracture or height loss. The osseous structures appear diffusely demineralized which may limit detection of small or nondisplaced fractures. Moderate arthrosis at the atlantodental and basion dens interval with degenerative spurring. Multilevel cervical spondylitic changes are present as well, better detailed below. Soft tissues and spinal canal: No pre or paravertebral fluid or swelling. No visible canal hematoma. Disc levels: Multilevel intervertebral disc height loss with spondylitic endplate changes. Slightly larger disc osteophyte complexes present at C6-7 which in combination with some posterior ligamentum flavum infolding in on left facet hypertrophic change result in moderate canal stenosis at this level. No other significant canal narrowing. Multilevel uncinate spurring and facet hypertrophic changes elsewhere result in mild-to-moderate multilevel neural foraminal  narrowing. Upper chest: No acute abnormality in the upper chest or imaged lung apices. Other: Atherosclerotic calcifications seen in the proximal great vessels. Cervical carotid atherosclerosis is present as well. Normal thyroid. IMPRESSION: 1. No acute intracranial abnormality. No significant scalp swelling or calvarial fracture. 2. Chronic microvascular ischemic changes, parenchymal volume loss and intracranial atherosclerosis. 3. No evidence of acute fracture or traumatic listhesis of the cervical spine. 4. Multilevel degenerative changes of the cervical spine, as described above. Features maximal at C6-7 with at most moderate canal stenosis  and multilevel mild-to-moderate neural foraminal narrowing. Electronically Signed   By: Kreg Shropshire M.D.   On: 02/08/2020 21:34   NM Pulmonary Perfusion  Result Date: 02/05/2020 CLINICAL DATA:  PE suspected EXAM: NUCLEAR MEDICINE PERFUSION LUNG SCAN TECHNIQUE: Perfusion images were obtained in multiple projections after intravenous injection of radiopharmaceutical. Ventilation scans intentionally deferred if perfusion scan and chest x-ray adequate for interpretation during COVID 19 epidemic. RADIOPHARMACEUTICALS:  4.2 mCi Tc-42m MAA IV COMPARISON:  Chest radiograph, 02/04/2020 FINDINGS: Normal, homogeneous perfusion of the lungs. No suspicious filling defects. IMPRESSION: Very low probability examination for pulmonary embolism by modified perfusion only PIOPED criteria (PE absent). Electronically Signed   By: Lauralyn Primes M.D.   On: 02/05/2020 09:59   CT T-SPINE NO CHARGE  Result Date: 02/09/2020 CLINICAL DATA:  Fall EXAM: CT THORACIC SPINE WITHOUT CONTRAST; CT CHEST WITHOUT CONTRAST TECHNIQUE: Multidetector CT images of the chest were obtained using the standard protocol without intravenous contrast. Dedicated thoracic spine images were reconstructed from the chest CT data. COMPARISON:  None. FINDINGS: CT CHEST FINDINGS Cardiovascular: There is atherosclerotic  calcification of the aorta with coronary artery calcifications as well. Mild cardiomegaly without pericardial effusion. Mediastinum/Nodes: No mediastinal, hilar or axillary lymphadenopathy. Normal visualized thyroid. Thoracic esophageal course is normal. Lungs/Pleura: 3 mm right upper lobe nodule (image 44). Multifocal scarring/atelectasis. No pleural effusion or pulmonary contusion. Upper abdomen: Unremarkable Musculoskeletal: No chest wall abnormality. No bony spinal canal stenosis. CT THORACIC SPINE FINDINGS Alignment: Normal. Vertebrae: No acute fracture or focal pathologic process. Disc levels: No spinal canal stenosis IMPRESSION: 1. No acute abnormality of the chest or thoracic spine. 2. 3 mm right upper lobe pulmonary nodule. No follow-up needed if patient is low-risk. Non-contrast chest CT can be considered in 12 months if patient is high-risk. This recommendation follows the consensus statement: Guidelines for Management of Incidental Pulmonary Nodules Detected on CT Images: From the Fleischner Society 2017; Radiology 2017; 284:228-243. Aortic Atherosclerosis (ICD10-I70.0). Electronically Signed   By: Deatra Robinson M.D.   On: 02/09/2020 02:16   CT L-SPINE NO CHARGE  Result Date: 02/08/2020 CLINICAL DATA:  Fall, struck back with low back pain EXAM: CT LUMBAR SPINE WITHOUT CONTRAST TECHNIQUE: Multiplanar reconstructions of the lumbar spine were generated from the contemporary CT of the abdomen and pelvis. COMPARISON:  Contemporary CT abdomen and pelvis. FINDINGS: Segmentation: 5 normally formed lumbar type vertebral bodies. Lowest fully formed disc space denoted as L5-S1. Alignment: Mild straightening of normal lumbar lordosis. Minimal retrolisthesis of L4 on L5 of approximately 3 mm. Favored to be on a facet degenerative basis without spondylolysis. No abnormally widened, jumped or perched facets. Vertebrae: No acute vertebral body fracture or height loss is seen. There are displaced fractures of the  left L1 through L4 transverse processes. No other acute fracture or traumatic osseous injury is seen in the lumbar spine or included portions of the bony pelvis. 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 Multilevel discogenic and facet degenerative changes are present, detailed below level by level. 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 Additional degenerative changes in the bilateral SI joints and hips. Paraspinal and other soft tissues: Minimal soft tissue thickening seen adjacent the left L1-L4 transverse process fractures. No other acute paravertebral fluid, swelling, gas or hemorrhage. No visible canal hematoma within the limitations of this exam. Posterior body wall edema. For findings in the posterior abdomen and pelvis, please see dedicated CT from which this study is  reconstructed. Disc levels: Level by level evaluation of the lumbar spine below: T11-T12: Disc desiccation and height loss with small global disc bulge and moderate facet arthropathy, left greater than right with severe left and moderate right neural foraminal narrowing. T12-L1: Mild disc height loss and bilateral facet arthropathy. Shallow global disc bulge. No significant spinal canal or foraminal stenosis. L1-L2: Moderate to severe disc height loss with desiccation and Schmorl's node formations and mild bilateral facet arthropathy. No significant canal stenosis. Mild left and moderate right foraminal narrowing. L2-L3: Severe disc height loss, desiccation and vacuum disc with moderate bilateral facet arthropathy, posterior ridging and global disc bulge. Moderate canal stenosis and mild bilateral foraminal narrowing. L3-L4: Mild disc height loss with desiccation, Schmorl's node formation and vacuum disc phenomenon. Mild bilateral facet arthropathy. Shallow global disc bulge and ligamentum flavum infolding with at most mild  canal stenosis but no significant neural foraminal narrowing. L4-L5: Retrolisthesis, mild disc height loss and moderate bilateral facet arthropathy, with shallow global disc bulge and some mild ligamentum flavum infolding at most mild canal stenosis but with moderate to severe bilateral foraminal narrowing. L5-S1: Severe disc height loss, desiccation and vacuum disc as well as severe bilateral facet arthropathy. Shallow global disc bulge. No significant canal stenosis. Moderate to severe bilateral neural foraminal narrowing. IMPRESSION: 1. Displaced fractures of the left L1 through L4 transverse processes. 2. No other acute fracture or traumatic osseous injury is seen in the lumbar spine or included portions of the bony pelvis. 3. Diffuse bony demineralization may limit detection of subtle nondisplaced fractures. 4. Multilevel discogenic and facet degenerative changes as described above, with moderate canal stenosis at L2-L3 and mild canal stenosis at L3-L4 and L4-L5. Multilevel moderate to severe neural foraminal narrowing is present as well. 5. For findings in the posterior abdomen and pelvis, please see dedicated CT from which this study is reconstructed. Electronically Signed   By: Kreg Shropshire M.D.   On: 02/08/2020 21:44   CT of chest without further acute findings aside from rib fractures.  No thoracic spine fractures noted on reconstituted images.  Will admit for pain control.  May ultimately need placement given her advanced age and current injuries.  Discussed with hospitalist, Dr. Loney Loh, who will admit for ongoing care.   Garlon Hatchet, PA-C 02/09/20 [id number]    Nicanor Alcon, April, MD 02/09/20 [phone]

## 2020-02-09 NOTE — Progress Notes (Signed)
This Clinical research associate has been informed that pt had emesis x1, Pt seen in room alert/oriented in no apparent distress.,denies any chest pain other than vomitting episodex1 . PRN zofran given, No further episode of vomting noted at this time. Pt in bed resting comfortably.

## 2020-02-10 DIAGNOSIS — W19XXXA Unspecified fall, initial encounter: Secondary | ICD-10-CM

## 2020-02-10 DIAGNOSIS — G929 Unspecified toxic encephalopathy: Secondary | ICD-10-CM

## 2020-02-10 DIAGNOSIS — N179 Acute kidney failure, unspecified: Secondary | ICD-10-CM

## 2020-02-10 DIAGNOSIS — S2242XA Multiple fractures of ribs, left side, initial encounter for closed fracture: Principal | ICD-10-CM

## 2020-02-10 DIAGNOSIS — Y92009 Unspecified place in unspecified non-institutional (private) residence as the place of occurrence of the external cause: Secondary | ICD-10-CM

## 2020-02-10 DIAGNOSIS — R5381 Other malaise: Secondary | ICD-10-CM

## 2020-02-10 DIAGNOSIS — I10 Essential (primary) hypertension: Secondary | ICD-10-CM

## 2020-02-10 DIAGNOSIS — I5033 Acute on chronic diastolic (congestive) heart failure: Secondary | ICD-10-CM

## 2020-02-10 LAB — URINALYSIS, ROUTINE W REFLEX MICROSCOPIC
Bilirubin Urine: NEGATIVE
Glucose, UA: NEGATIVE mg/dL
Hgb urine dipstick: NEGATIVE
Ketones, ur: NEGATIVE mg/dL
Nitrite: NEGATIVE
Protein, ur: NEGATIVE mg/dL
Specific Gravity, Urine: 1.027 (ref 1.005–1.030)
WBC, UA: >50 WBC/hpf — ABNORMAL HIGH (ref 0–5)
pH: 5 (ref 5.0–8.0)

## 2020-02-10 LAB — GLUCOSE, CAPILLARY
Glucose-Capillary: 95 mg/dL (ref 70–99)
Glucose-Capillary: 143 mg/dL — ABNORMAL HIGH (ref 70–99)
Glucose-Capillary: 134 mg/dL — ABNORMAL HIGH (ref 70–99)
Glucose-Capillary: 102 mg/dL — ABNORMAL HIGH (ref 70–99)
Glucose-Capillary: 92 mg/dL (ref 70–99)

## 2020-02-10 LAB — CREATININE, URINE, RANDOM: Creatinine, Urine: 327.81 mg/dL

## 2020-02-10 LAB — RENAL FUNCTION PANEL
Albumin: 3.2 g/dL — ABNORMAL LOW (ref 3.5–5.0)
Anion gap: 10 (ref 5–15)
BUN: 26 mg/dL — ABNORMAL HIGH (ref 8–23)
CO2: 22 mmol/L (ref 22–32)
Calcium: 9.2 mg/dL (ref 8.9–10.3)
Chloride: 108 mmol/L (ref 98–111)
Creatinine, Ser: 1.7 mg/dL — ABNORMAL HIGH (ref 0.44–1.00)
GFR, Estimated: 28 mL/min — ABNORMAL LOW (ref >60)
Glucose, Bld: 141 mg/dL — ABNORMAL HIGH (ref 70–99)
Phosphorus: 4.4 mg/dL (ref 2.5–4.6)
Potassium: 3.6 mmol/L (ref 3.5–5.1)
Sodium: 140 mmol/L (ref 135–145)

## 2020-02-10 LAB — MAGNESIUM: Magnesium: 2.1 mg/dL (ref 1.7–2.4)

## 2020-02-10 LAB — HEMOGLOBIN AND HEMATOCRIT, BLOOD
HCT: 32.7 % — ABNORMAL LOW (ref 36.0–46.0)
Hemoglobin: 10.3 g/dL — ABNORMAL LOW (ref 12.0–15.0)

## 2020-02-10 LAB — SODIUM, URINE, RANDOM: Sodium, Ur: 37 mmol/L

## 2020-02-10 MED ORDER — INSULIN ASPART 100 UNIT/ML ~~LOC~~ SOLN: 0.0000 [IU] | Freq: Every day | SUBCUTANEOUS | Status: DC

## 2020-02-10 MED ORDER — INSULIN ASPART 100 UNIT/ML ~~LOC~~ SOLN
0.0000 [IU] | Freq: Three times a day (TID) | SUBCUTANEOUS | Status: DC
  Administered 2020-02-11: 2 [IU] via SUBCUTANEOUS
  Administered 2020-02-12: 3 [IU] via SUBCUTANEOUS
  Administered 2020-02-12: 2 [IU] via SUBCUTANEOUS
  Administered 2020-02-13: 3 [IU] via SUBCUTANEOUS

## 2020-02-10 NOTE — Plan of Care (Signed)
  Problem: Pain Managment: Goal: General experience of comfort will improve Outcome: Progressing   Problem: Safety: Goal: Ability to remain free from injury will improve Outcome: Progressing   Problem: Skin Integrity: Goal: Risk for impaired skin integrity will decrease Outcome: Progressing   

## 2020-02-10 NOTE — Progress Notes (Signed)
PT Cancellation Note  Patient Details Name: Jenna Murphy MRN: [id number] DOB: Aug 08, 1927   Cancelled Treatment:    Reason Eval/Treat Not Completed: Other (comment)   Sleeping soundly after being restless most of the day;   Will follow up later today as time allows;  Otherwise, will follow up for PT tomorrow;   Thank you,  Van Clines, PT  Acute Rehabilitation Services Pager [phone] Office [phone]     Levi Aland 02/10/2020, 4:26 PM

## 2020-02-10 NOTE — Progress Notes (Signed)
Pt seen this morning, restless,getting OOB,pulled IV lines.refused breakfast, scheduled meds despite being educated the importance of taking her meds. Pt remains confused/agitated. Poor satefy awareness. Attending MD made aware of above concerns.

## 2020-02-10 NOTE — Progress Notes (Signed)
PROGRESS NOTE  Jenna Murphy WVP:[id number] DOB: 13-Nov-1927   PCP: Andi Devon, MD  Patient is from: Home.  Lives alone.  Uses walker at baseline.  DOA: 02/08/2020 LOS: 1  Chief complaints: Accidental fall and back pain  Brief Narrative / Interim history: 86 year old female with history of NIDDM-2, goals of pancreatitis, diastolic CHF, CKD-3A, HLD and recent hospitalization for CHF exacerbation and AKI returning with left-sided back pain after accidental fall at home.  Reportedly had slipped and fell in the bathtub.  No head trauma or loss of consciousness.  In ED, hemodynamically stable.  Hgb 10.6 (at baseline). Cr 1.8 (b/l ~1.5).  CT head, cervical spine and thoracic spine without acute finding.  CT lumbar spine showed displaced left L1-L4 transverse process fractures.  CT chest, abdomen and pelvis showed minimally displaced fractures of the posterolateral left 10th and 11th ribs.  Admitted for pain control and therapy.  Hospitalization complicated by acute encephalopathy likely toxic from pain medications  Subjective: Seen and examined earlier this morning.  She is awake and alert but confused.  She is oriented to self and month.  She thinks she is at home.  She is restless.  Trying to get out of the bed.  Pulled IV lines this morning.  Refusing meds and care.  She responds no to pain.  Does not appear to be in pain.  Objective: Vitals:   02/09/20 0805 02/09/20 1521 02/09/20 1936 02/10/20 0644  BP: (!) 155/84 120/73 116/71 (!) 142/74  Pulse: 76 61 80 70  Resp: 20  20 15   Temp: 98.3 F (36.8 C) 98.1 F (36.7 C) (!) 97.4 F (36.3 C) (!) 97.5 F (36.4 C)  TempSrc: Oral Oral Oral Oral  SpO2: 96% 99% 94% 99%  Weight:      Height:        Intake/Output Summary (Last 24 hours) at 02/10/2020 1406 Last data filed at 02/10/2020 0900 Gross per 24 hour  Intake 360 ml  Output --  Net 360 ml   Filed Weights   02/08/20 1844  Weight: 68.9 kg    Examination:  GENERAL: No  apparent distress.  Nontoxic. HEENT: MMM.  Vision and hearing grossly intact.  NECK: Supple.  No apparent JVD.  RESP:  No IWOB.  Fair aeration bilaterally. CVS:  RRR. Heart sounds normal.  ABD/GI/GU: BS+. Abd soft, NTND.  MSK/EXT:  Moves extremities.  Significant muscle mass and subcu fat loss. SKIN: no apparent skin lesion or wound NEURO: Awake and alert.  Only oriented to self and months. No focal neuro deficit. PSYCH: Restless. Procedures:  None  Microbiology summarized: COVID-19 PCR negative. Influenza PCR negative.  Assessment & Plan: Accidental fall at home-slipped & fell in bathtub. Not head trauma or LOC.  CT head & cervical spine negative Displaced left L1-L4 transverse process fractures  Traumatic rib fracture-CT showed mildly displaced fractures of left 10th and 11th ribs posterolaterally -K pad, Lidoderm, scheduled Tylenol.  Discontinue baclofen and opiates in the setting of encephalopathy -Bowel regimen and antiemetics -Encourage incentive spirometry  Acute encephalopathy: Likely toxic from pain medication.  Could also be delirium.  Awake and alert but only oriented to self and months.  Restless and refusing cares.  -Continue scheduled Tylenol, K pad and Lidoderm -Discontinue baclofen and opiates -Reorientation and delirium precautions -Fall and delirium precautions -Safety sitter  Chronic diastolic CHF: Appears euvolemic.  No acute cardiopulmonary symptoms. -Continue holding torsemide in the setting of AKI -Monitor fluid status and renal function  AKI on CKD  stage IIIa: Cr 1.8 (admit)>> 1.41> 1.70.  Baseline~1.2.  Likely prerenal. -Continue holding torsemide.  No cardiopulmonary symptoms. -Continue monitoring  Normal anion gap metabolic acidosis: Likely due to AKI. -Continue monitoring  Uncontrolled IDDM-2 with hyperglycemia: A1c 7.6% Recent Labs  Lab 02/09/20 1554 02/09/20 2137 02/10/20 0647 02/10/20 1045 02/10/20 1154  GLUCAP 111* 151* 95 143*  134*  -Continue SSI-moderate with nightly coverage -Hold home Lantus.  P.o. intake unreliable. -Continue home statin  Hypertension: Normotensive for most part. -Continue metoprolol and BiDil.  Hyperlipidemia -Continue Lipitor  Pulmonary nodule: CT showing a 3 mm right upper lobe pulmonary nodule.  -noncontrast chest CT in 12 months for follow-up.  Debility/physical deconditioning -OT recommended SNF.  PT recommends home health.  Family interested in SNF. -TOC consulted for SNF.  Body mass index is 26.09 kg/m.         DVT prophylaxis:  SCDs Start: 02/09/20 0410  Code Status: Full code Family Communication: Updated patient's son, Dwayne over the phone. Status is: Inpatient  Remains inpatient appropriate because:Altered mental status, Unsafe d/c plan and Inpatient level of care appropriate due to severity of illness   Dispo: The patient is from: Home              Anticipated d/c is to: SNF              Anticipated d/c date is: 2 days              Patient currently is not medically stable to d/c.       Consultants:  None   Sch Meds:  Scheduled Meds: . acetaminophen  1,000 mg Oral Q8H  . atorvastatin  20 mg Oral q1800  . insulin aspart  0-15 Units Subcutaneous TID WC  . insulin aspart  0-5 Units Subcutaneous QHS  . isosorbide-hydrALAZINE  1 tablet Oral TID  . lidocaine  1 patch Transdermal Q24H  . metoprolol tartrate  75 mg Oral BID   Continuous Infusions: PRN Meds:.ondansetron (ZOFRAN) IV, polyethylene glycol, senna-docusate  Antimicrobials: Anti-infectives (From admission, onward)   None       I have personally reviewed the following labs and images: CBC: Recent Labs  Lab 02/04/20 1143 02/05/20 0313 02/08/20 2218 02/09/20 0858  WBC 7.9 7.9 11.4* 9.3  NEUTROABS  --   --  4.8  --   HGB 11.3* 11.8* 10.6* 10.7*  HCT 36.9 37.5 34.0* 34.0*  MCV 81.8 80.3 81.3 80.4  PLT 299 301 310 293   BMP &GFR Recent Labs  Lab 02/06/20 0219  02/07/20 0312 02/08/20 2218 02/09/20 0858 02/10/20 1150  NA 142 141 141 138 140  K 4.1 3.9 4.1 3.8 3.6  CL 109 111 108 107 108  CO2 25 21* 19* 18* 22  GLUCOSE 70 123* 131* 134* 141*  BUN 23 19 25* 19 26*  CREATININE 1.50* 1.55* 1.80* 1.41* 1.70*  CALCIUM 9.1 8.7* 9.1 9.0 9.2  MG  --   --   --  1.9  --   PHOS  --   --   --  3.4 4.4   Estimated Creatinine Clearance: 21 mL/min (A) (by C-G formula based on SCr of 1.7 mg/dL (H)). Liver & Pancreas: Recent Labs  Lab 02/05/20 0313 02/08/20 2218 02/09/20 0858 02/10/20 1150  AST 19 28  --   --   ALT 14 21  --   --   ALKPHOS 83 81  --   --   BILITOT 1.0 1.1  --   --  PROT 7.1 7.0  --   --   ALBUMIN 3.5 3.6 3.4* 3.2*   Recent Labs  Lab 02/08/20 2218  LIPASE 29   No results for input(s): AMMONIA in the last 168 hours. Diabetic: Recent Labs    02/09/20 0858  HGBA1C 7.6*   Recent Labs  Lab 02/09/20 1554 02/09/20 2137 02/10/20 0647 02/10/20 1045 02/10/20 1154  GLUCAP 111* 151* 95 143* 134*   Cardiac Enzymes: No results for input(s): CKTOTAL, CKMB, CKMBINDEX, TROPONINI in the last 168 hours. No results for input(s): PROBNP in the last 8760 hours. Coagulation Profile: No results for input(s): INR, PROTIME in the last 168 hours. Thyroid Function Tests: No results for input(s): TSH, T4TOTAL, FREET4, T3FREE, THYROIDAB in the last 72 hours. Lipid Profile: No results for input(s): CHOL, HDL, LDLCALC, TRIG, CHOLHDL, LDLDIRECT in the last 72 hours. Anemia Panel: No results for input(s): VITAMINB12, FOLATE, FERRITIN, TIBC, IRON, RETICCTPCT in the last 72 hours. Urine analysis:    Component Value Date/Time   COLORURINE AMBER (A) 02/10/2020 0924   APPEARANCEUR HAZY (A) 02/10/2020 0924   LABSPEC 1.027 02/10/2020 0924   PHURINE 5.0 02/10/2020 0924   GLUCOSEU NEGATIVE 02/10/2020 0924   HGBUR NEGATIVE 02/10/2020 0924   BILIRUBINUR NEGATIVE 02/10/2020 0924   KETONESUR NEGATIVE 02/10/2020 0924   PROTEINUR NEGATIVE 02/10/2020  0924   UROBILINOGEN 0.2 05/15/2014 0858   NITRITE NEGATIVE 02/10/2020 0924   LEUKOCYTESUR LARGE (A) 02/10/2020 0924   Sepsis Labs: Invalid input(s): PROCALCITONIN, LACTICIDVEN  Microbiology: Recent Results (from the past 240 hour(s))  Respiratory Panel by RT PCR (Flu A&B, Covid) - Nasopharyngeal Swab     Status: None   Collection Time: 02/04/20 12:58 PM   Specimen: Nasopharyngeal Swab  Result Value Ref Range Status   SARS Coronavirus 2 by RT PCR NEGATIVE NEGATIVE Final    Comment: (NOTE) SARS-CoV-2 target nucleic acids are NOT DETECTED.  The SARS-CoV-2 RNA is generally detectable in upper respiratoy specimens during the acute phase of infection. The lowest concentration of SARS-CoV-2 viral copies this assay can detect is 131 copies/mL. A negative result does not preclude SARS-Cov-2 infection and should not be used as the sole basis for treatment or other patient management decisions. A negative result may occur with  improper specimen collection/handling, submission of specimen other than nasopharyngeal swab, presence of viral mutation(s) within the areas targeted by this assay, and inadequate number of viral copies (<131 copies/mL). A negative result must be combined with clinical observations, patient history, and epidemiological information. The expected result is Negative.  Fact Sheet for Patients:  https://www.moore.com/  Fact Sheet for Healthcare Providers:  https://www.young.biz/  This test is no t yet approved or cleared by the [place] FDA and  has been authorized for detection and/or diagnosis of SARS-CoV-2 by FDA under an Emergency Use Authorization (EUA). This EUA will remain  in effect (meaning this test can be used) for the duration of the COVID-19 declaration under Section 564(b)(1) of the Act, 21 U.S.C. section 360bbb-3(b)(1), unless the authorization is terminated or revoked sooner.     Influenza A by PCR  NEGATIVE NEGATIVE Final   Influenza B by PCR NEGATIVE NEGATIVE Final    Comment: (NOTE) The Xpert Xpress SARS-CoV-2/FLU/RSV assay is intended as an aid in  the diagnosis of influenza from Nasopharyngeal swab specimens and  should not be used as a sole basis for treatment. Nasal washings and  aspirates are unacceptable for Xpert Xpress SARS-CoV-2/FLU/RSV  testing.  Fact Sheet for Patients: https://www.moore.com/  Fact Sheet  for Healthcare Providers: https://www.young.biz/  This test is not yet approved or cleared by the [place] and  has been authorized for detection and/or diagnosis of SARS-CoV-2 by  FDA under an Emergency Use Authorization (EUA). This EUA will remain  in effect (meaning this test can be used) for the duration of the  Covid-19 declaration under Section 564(b)(1) of the Act, 21  U.S.C. section 360bbb-3(b)(1), unless the authorization is  terminated or revoked. Performed at [facility], [address] [place], [place] [address]   Urine culture     Status: Abnormal   Collection Time: 02/04/20  8:12 PM   Specimen: Urine, Random  Result Value Ref Range Status   Specimen Description URINE, RANDOM  Final   Special Requests   Final    NONE Performed at [facility], [address] [place], [place] [address]    Culture MULTIPLE SPECIES PRESENT, SUGGEST RECOLLECTION (A)  Final   Report Status 02/06/2020 FINAL  Final  Respiratory Panel by RT PCR (Flu A&B, Covid) - Nasopharyngeal Swab     Status: None   Collection Time: 02/08/20 11:19 PM   Specimen: Nasopharyngeal Swab  Result Value Ref Range Status   SARS Coronavirus 2 by RT PCR NEGATIVE NEGATIVE Final    Comment: (NOTE) SARS-CoV-2 target nucleic acids are NOT DETECTED.  The SARS-CoV-2 RNA is generally detectable in upper respiratoy specimens during the acute phase of infection. The lowest concentration of SARS-CoV-2 viral copies this assay can detect  is 131 copies/mL. A negative result does not preclude SARS-Cov-2 infection and should not be used as the sole basis for treatment or other patient management decisions. A negative result may occur with  improper specimen collection/handling, submission of specimen other than nasopharyngeal swab, presence of viral mutation(s) within the areas targeted by this assay, and inadequate number of viral copies (<131 copies/mL). A negative result must be combined with clinical observations, patient history, and epidemiological information. The expected result is Negative.  Fact Sheet for Patients:  https://www.moore.com/  Fact Sheet for Healthcare Providers:  https://www.young.biz/  This test is no t yet approved or cleared by the [place] FDA and  has been authorized for detection and/or diagnosis of SARS-CoV-2 by FDA under an Emergency Use Authorization (EUA). This EUA will remain  in effect (meaning this test can be used) for the duration of the COVID-19 declaration under Section 564(b)(1) of the Act, 21 U.S.C. section 360bbb-3(b)(1), unless the authorization is terminated or revoked sooner.     Influenza A by PCR NEGATIVE NEGATIVE Final   Influenza B by PCR NEGATIVE NEGATIVE Final    Comment: (NOTE) The Xpert Xpress SARS-CoV-2/FLU/RSV assay is intended as an aid in  the diagnosis of influenza from Nasopharyngeal swab specimens and  should not be used as a sole basis for treatment. Nasal washings and  aspirates are unacceptable for Xpert Xpress SARS-CoV-2/FLU/RSV  testing.  Fact Sheet for Patients: https://www.moore.com/  Fact Sheet for Healthcare Providers: https://www.young.biz/  This test is not yet approved or cleared by the [place] FDA and  has been authorized for detection and/or diagnosis of SARS-CoV-2 by  FDA under an Emergency Use Authorization (EUA). This EUA will remain  in effect  (meaning this test can be used) for the duration of the  Covid-19 declaration under Section 564(b)(1) of the Act, 21  U.S.C. section 360bbb-3(b)(1), unless the authorization is  terminated or revoked. Performed at [facility], [address], [place], [place] [address]  Radiology Studies: No results found.    Yamil Oelke T. Astryd Pearcy [facility]  If 7PM-7AM, please contact night-coverage [email or web address] 02/10/2020, 2:06 PM

## 2020-02-10 NOTE — Plan of Care (Signed)
  Problem: Education: Goal: Knowledge of General Education information will improve Description: Including pain rating scale, medication(s)/side effects and non-pharmacologic comfort measures Outcome: Progressing   Problem: Health Behavior/Discharge Planning: Goal: Ability to manage health-related needs will improve Outcome: Progressing   Problem: Clinical Measurements: Goal: Will remain free from infection Outcome: Progressing   Problem: Activity: Goal: Risk for activity intolerance will decrease Outcome: Progressing   Problem: Nutrition: Goal: Adequate nutrition will be maintained Outcome: Progressing   Problem: Coping: Goal: Level of anxiety will decrease Outcome: Progressing   Problem: Elimination: Goal: Will not experience complications related to bowel motility Outcome: Progressing   Problem: Pain Managment: Goal: General experience of comfort will improve Outcome: Progressing   

## 2020-02-10 NOTE — Progress Notes (Signed)
Pt's son Dwyane at bedside, updated with pt's current status. No complaints voiced.

## 2020-02-11 LAB — CBC
HCT: 33.7 % — ABNORMAL LOW (ref 36.0–46.0)
Hemoglobin: 10.5 g/dL — ABNORMAL LOW (ref 12.0–15.0)
MCH: 25.4 pg — ABNORMAL LOW (ref 26.0–34.0)
MCHC: 31.2 g/dL (ref 30.0–36.0)
MCV: 81.6 fL (ref 80.0–100.0)
Platelets: 320 10*3/uL (ref 150–400)
RBC: 4.13 MIL/uL (ref 3.87–5.11)
RDW: 15.5 % (ref 11.5–15.5)
WBC: 9.1 10*3/uL (ref 4.0–10.5)
nRBC: 0 % (ref 0.0–0.2)

## 2020-02-11 LAB — RENAL FUNCTION PANEL
Albumin: 3.2 g/dL — ABNORMAL LOW (ref 3.5–5.0)
Anion gap: 11 (ref 5–15)
BUN: 27 mg/dL — ABNORMAL HIGH (ref 8–23)
CO2: 21 mmol/L — ABNORMAL LOW (ref 22–32)
Calcium: 9.1 mg/dL (ref 8.9–10.3)
Chloride: 108 mmol/L (ref 98–111)
Creatinine, Ser: 1.76 mg/dL — ABNORMAL HIGH (ref 0.44–1.00)
GFR, Estimated: 27 mL/min — ABNORMAL LOW (ref >60)
Glucose, Bld: 107 mg/dL — ABNORMAL HIGH (ref 70–99)
Phosphorus: 4.5 mg/dL (ref 2.5–4.6)
Potassium: 4.5 mmol/L (ref 3.5–5.1)
Sodium: 140 mmol/L (ref 135–145)

## 2020-02-11 LAB — GLUCOSE, CAPILLARY
Glucose-Capillary: 109 mg/dL — ABNORMAL HIGH (ref 70–99)
Glucose-Capillary: 100 mg/dL — ABNORMAL HIGH (ref 70–99)
Glucose-Capillary: 144 mg/dL — ABNORMAL HIGH (ref 70–99)
Glucose-Capillary: 147 mg/dL — ABNORMAL HIGH (ref 70–99)

## 2020-02-11 LAB — MAGNESIUM: Magnesium: 2.2 mg/dL (ref 1.7–2.4)

## 2020-02-11 MED ORDER — SODIUM CHLORIDE 0.9 % IV SOLN: INTRAVENOUS | Status: DC

## 2020-02-11 MED ORDER — SODIUM CHLORIDE 0.9 % IV SOLN: Freq: Once | INTRAVENOUS | Status: AC

## 2020-02-11 NOTE — Plan of Care (Signed)
  Problem: Pain Managment: Goal: General experience of comfort will improve Outcome: Progressing   Problem: Safety: Goal: Ability to remain free from injury will improve Outcome: Progressing   Problem: Skin Integrity: Goal: Risk for impaired skin integrity will decrease Outcome: Progressing   

## 2020-02-11 NOTE — TOC CAGE-AID Note (Signed)
Transition of Care [facility]) - CAGE-AID Screening   Patient Details  Name: Jenna Murphy MRN: [id number] Date of Birth: Aug 17, 1927  [facility] CM/SW Contact:    Jimmy Picket, [place] Phone Number: 02/11/2020, 1:05 PM   Clinical Narrative:  Pt is unable to participate due to memory impairment.   CAGE-AID Screening: Substance Abuse Screening unable to be completed due to: : Patient unable to participate               Isabella Stalling Clinical Social Worker [phone]

## 2020-02-11 NOTE — [place] FL2 (Signed)
[place] MEDICAID FL2 LEVEL OF CARE SCREENING TOOL     IDENTIFICATION  Patient Name: SHANICE POZNANSKI Birthdate: September 11, 1927 Sex: female Admission Date (Current Location): 02/08/2020  [facility] and [place] Number:  Producer, television/film/video and Address:  [facility], [address] [place], [place] [address]      Provider Number: [id number]  Attending Physician Name and Address:  Almon Hercules, MD  Relative Name and Phone Number:       Current Level of Care: Hospital Recommended Level of Care: Skilled Nursing Facility Prior Approval Number:    Date Approved/Denied:   PASRR Number: [id number]  Discharge Plan: SNF    Current Diagnoses: Patient Active Problem List   Diagnosis Date Noted  . Lumbar transverse process fracture (HCC) 02/09/2020  . Fall 02/09/2020  . DOE (dyspnea on exertion) 02/06/2020  . Acute heart failure with preserved ejection fraction (HFpEF) (HCC)   . AKI (acute kidney injury) (HCC)   . Type 2 diabetes mellitus with hyperlipidemia (HCC)   . Type 2 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)   . CHF (congestive heart failure) (HCC) 02/04/2020  . Palpitations 05/28/2019  . Chronic heart failure with preserved ejection fraction (HCC) 05/28/2019  . Exertional dyspnea 05/27/2019  . PAD (peripheral artery disease) (HCC) 09/25/2018  . 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 Chest pain 05/22/2011  . Essential hypertension 05/22/2011    Orientation RESPIRATION BLADDER Height & Weight     Self, Time, Situation, Place  Normal Incontinent Weight: 152 lb (68.9 kg) Height:  5\' 4"  (162.6 cm)  BEHAVIORAL SYMPTOMS/MOOD NEUROLOGICAL BOWEL NUTRITION STATUS      Incontinent Diet (See discharge summery)  AMBULATORY STATUS COMMUNICATION OF NEEDS Skin   Extensive Assist Verbally Normal                       Personal Care Assistance Level of Assistance  Bathing, Feeding, Dressing Bathing Assistance: Maximum  assistance Feeding assistance: Independent Dressing Assistance: Maximum assistance     Functional Limitations Info  Sight, Hearing, Speech Sight Info: Adequate Hearing Info: Adequate Speech Info: Adequate    SPECIAL CARE FACTORS FREQUENCY  OT (By licensed OT), PT (By licensed PT)     PT Frequency: 5x a week OT Frequency: 5x a week            Contractures      Additional Factors Info  Code Status, Allergies Code Status Info: Full Allergies Info: NKA           Current Medications (02/11/2020):  This is the current hospital active medication list Current Facility-Administered Medications  Medication Dose Route Frequency Provider Last Rate Last Admin  . 0.9 %  sodium chloride infusion   Intravenous Continuous 13/04/2019 T, MD 60 mL/hr at 02/11/20 1011 New Bag at 02/11/20 1011  . acetaminophen (TYLENOL) tablet 1,000 mg  1,000 mg Oral Q8H Gonfa, Taye T, MD   1,000 mg at 02/11/20 1413  . atorvastatin (LIPITOR) tablet 20 mg  20 mg Oral q1800 13/01/21, MD   20 mg at 02/10/20 1700  . insulin aspart (novoLOG) injection 0-15 Units  0-15 Units Subcutaneous TID WC Gonfa, Taye T, MD      . insulin aspart (novoLOG) injection 0-5 Units  0-5 Units Subcutaneous QHS Gonfa, Taye T, MD      . isosorbide-hydrALAZINE (BIDIL) 20-37.5 MG per tablet 1 tablet  1 tablet Oral TID 06-11-1984, MD  1 tablet at 02/11/20 1015  . lidocaine (LIDODERM) 5 % 1 patch  1 patch Transdermal Q24H Almon Hercules, MD   1 patch at 02/11/20 1015  . metoprolol tartrate (LOPRESSOR) tablet 75 mg  75 mg Oral BID John Giovanni, MD   75 mg at 02/11/20 1015  . ondansetron (ZOFRAN) injection 4 mg  4 mg Intravenous Q8H PRN Gonfa, Taye T, MD      . polyethylene glycol (MIRALAX / GLYCOLAX) packet 17 g  17 g Oral BID PRN Gonfa, Taye T, MD      . senna-docusate (Senokot-S) tablet 1 tablet  1 tablet Oral BID PRN Almon Hercules, MD         Discharge Medications: Please see discharge summary for a list of  discharge medications.  Relevant Imaging Results:  Relevant Lab Results:   Additional Information SSN. [id number]  Jimmy Picket, [place]

## 2020-02-11 NOTE — Progress Notes (Signed)
PROGRESS NOTE  Jenna Murphy [facility]:[id number] DOB: Nov 08, 1927   PCP: Andi Devon, MD  Patient is from: Home.  Lives alone.  Uses walker at baseline.  DOA: 02/08/2020 LOS: 2  Chief complaints: Accidental fall and back pain  Brief Narrative / Interim history: 86 year old female with history of NIDDM-2, goals of pancreatitis, diastolic CHF, CKD-3A, HLD and recent hospitalization for CHF exacerbation and AKI returning with left-sided back pain after accidental fall at home.  Reportedly had slipped and fell in the bathtub.  No head trauma or loss of consciousness.  In ED, hemodynamically stable.  Hgb 10.6 (at baseline). Cr 1.8 (b/l ~1.5).  CT head, cervical spine and thoracic spine without acute finding.  CT lumbar spine showed displaced left L1-L4 transverse process fractures.  CT chest, abdomen and pelvis showed minimally displaced fractures of the posterolateral left 10th and 11th ribs.  Admitted for pain control and therapy.  Hospitalization complicated by acute encephalopathy likely toxic from pain medications  Subjective: Seen and examined earlier this morning.  Remains confused and agitated.  Reportedly pulled out IV lines out again.  She is restless and fidgeting in the bed.  Does not appear to be in distress.  Seems to have some dysarthria.  Oriented to self and place only.  Objective: Vitals:   02/10/20 1412 02/10/20 2019 02/11/20 0435 02/11/20 0725  BP: 135/90 (!) 171/93 (!) 170/94 (!) 141/77  Pulse: 100 93 64 83  Resp: 18 17 20 18   Temp: 98 F (36.7 C) 98.1 F (36.7 C) 98.4 F (36.9 C) (!) 97.5 F (36.4 C)  TempSrc:  Oral Oral Oral  SpO2: 90% 92% 95% 95%  Weight:      Height:        Intake/Output Summary (Last 24 hours) at 02/11/2020 1201 Last data filed at 02/11/2020 0900 Gross per 24 hour  Intake 120 ml  Output 350 ml  Net -230 ml   Filed Weights   02/08/20 1844  Weight: 68.9 kg    Examination:  GENERAL: Restless and fidgeting.  Nontoxic. HEENT: MMM.   Vision and hearing grossly intact.  NECK: Supple.  No apparent JVD.  RESP: On room air.  No IWOB.  Fair aeration bilaterally. CVS:  RRR. Heart sounds normal.  ABD/GI/GU: BS+. Abd soft, NTND.  MSK/EXT:  Moves extremities. No apparent deformity. No edema.  SKIN: no apparent skin lesion or wound NEURO: Awake and alert but confused.  Oriented to self and place only. Seems to have occasional ataxia. PSYCH: Restless and fidgeting. Procedures:  None  Microbiology summarized: COVID-19 PCR negative. Influenza PCR negative.  Assessment & Plan: Accidental fall at home-slipped & fell in bathtub. Not head trauma or LOC.  CT head & cervical spine negative Displaced left L1-L4 transverse process fractures  Traumatic rib fracture-CT showed mildly displaced fractures of left 10th and 11th ribs posterolaterally -K pad, Lidoderm, scheduled Tylenol.  Discontinued baclofen and opiates in the setting of encephalopathy -Bowel regimen and antiemetics -Encourage incentive spirometry  Acute encephalopathy/agitation/ataxia: Likely due to baclofen.  Restless, fidgeting with occasional ataxia.  Patient's daughter also reports some baseline coordination issue especially with walking.  Also agitated and pulled his IV lines again. -Continue scheduled Tylenol, K pad and Lidoderm -Discontinued baclofen and opiates -Reorientation and delirium precautions -Fall and delirium precautions -Continue one-to-one safety sitter  Chronic diastolic CHF: Appears euvolemic.  No acute cardiopulmonary symptoms. -Continue holding torsemide in the setting of AKI -Monitor fluid status and renal function  AKI on CKD stage IIIa: Cr 1.8 (  admit)>> 1.41> 1.76.  Baseline~1.2.  Likely prerenal due to poor p.o. intake -Continue holding torsemide.  No cardiopulmonary symptoms. -IV normal saline bolus 500 cc.  Will bolus intermittently.  Normal anion gap metabolic acidosis: Likely due to AKI.  Improved. -Continue  monitoring  Uncontrolled IDDM-2 with hyperglycemia: A1c 7.6% Recent Labs  Lab 02/10/20 1154 02/10/20 1653 02/10/20 2103 02/11/20 0651 02/11/20 1130  GLUCAP 134* 102* 92 109* 100*  -Continue SSI-moderate with nightly coverage -Decrease Lantus to 10 units twice daily-p.o. intake unreliable. -Continue home statin  Hypertension: Normotensive for most part. -Continue metoprolol and BiDil.  Hyperlipidemia -Continue Lipitor  Pulmonary nodule: CT showing a 3 mm right upper lobe pulmonary nodule.  -noncontrast chest CT in 12 months for follow-up.  Debility/physical deconditioning -OT recommended SNF.  PT recommends home health.  Family interested in SNF. -TOC consulted for SNF.  Body mass index is 26.09 kg/m.         DVT prophylaxis:  SCDs Start: 02/09/20 0410  Code Status: Full code Family Communication: Updated patient's daughter over the phone. Status is: Inpatient  Remains inpatient appropriate because:Altered mental status, Unsafe d/c plan and Inpatient level of care appropriate due to severity of illness   Dispo: The patient is from: Home              Anticipated d/c is to: SNF              Anticipated d/c date is: 2 days              Patient currently is not medically stable to d/c.       Consultants:  None   Sch Meds:  Scheduled Meds: . acetaminophen  1,000 mg Oral Q8H  . atorvastatin  20 mg Oral q1800  . insulin aspart  0-15 Units Subcutaneous TID WC  . insulin aspart  0-5 Units Subcutaneous QHS  . isosorbide-hydrALAZINE  1 tablet Oral TID  . lidocaine  1 patch Transdermal Q24H  . metoprolol tartrate  75 mg Oral BID   Continuous Infusions: . sodium chloride 60 mL/hr at 02/11/20 1011  . sodium chloride 250 mL/hr at 02/11/20 1012   PRN Meds:.ondansetron (ZOFRAN) IV, polyethylene glycol, senna-docusate  Antimicrobials: Anti-infectives (From admission, onward)   None       I have personally reviewed the following labs and  images: CBC: Recent Labs  Lab 02/05/20 0313 02/08/20 2218 02/09/20 0858 02/10/20 1528 02/11/20 0333  WBC 7.9 11.4* 9.3  --  9.1  NEUTROABS  --  4.8  --   --   --   HGB 11.8* 10.6* 10.7* 10.3* 10.5*  HCT 37.5 34.0* 34.0* 32.7* 33.7*  MCV 80.3 81.3 80.4  --  81.6  PLT 301 310 293  --  320   BMP &GFR Recent Labs  Lab 02/07/20 0312 02/08/20 2218 02/09/20 0858 02/10/20 1150 02/10/20 1528 02/11/20 0333  NA 141 141 138 140  --  140  K 3.9 4.1 3.8 3.6  --  4.5  CL 111 108 107 108  --  108  CO2 21* 19* 18* 22  --  21*  GLUCOSE 123* 131* 134* 141*  --  107*  BUN 19 25* 19 26*  --  27*  CREATININE 1.55* 1.80* 1.41* 1.70*  --  1.76*  CALCIUM 8.7* 9.1 9.0 9.2  --  9.1  MG  --   --  1.9  --  2.1 2.2  PHOS  --   --  3.4 4.4  --  4.5   Estimated Creatinine Clearance: 20.3 mL/min (A) (by C-G formula based on SCr of 1.76 mg/dL (H)). Liver & Pancreas: Recent Labs  Lab 02/05/20 0313 02/08/20 2218 02/09/20 0858 02/10/20 1150 02/11/20 0333  AST 19 28  --   --   --   ALT 14 21  --   --   --   ALKPHOS 83 81  --   --   --   BILITOT 1.0 1.1  --   --   --   PROT 7.1 7.0  --   --   --   ALBUMIN 3.5 3.6 3.4* 3.2* 3.2*   Recent Labs  Lab 02/08/20 2218  LIPASE 29   No results for input(s): AMMONIA in the last 168 hours. Diabetic: Recent Labs    02/09/20 0858  HGBA1C 7.6*   Recent Labs  Lab 02/10/20 1154 02/10/20 1653 02/10/20 2103 02/11/20 0651 02/11/20 1130  GLUCAP 134* 102* 92 109* 100*   Cardiac Enzymes: No results for input(s): CKTOTAL, CKMB, CKMBINDEX, TROPONINI in the last 168 hours. No results for input(s): PROBNP in the last 8760 hours. Coagulation Profile: No results for input(s): INR, PROTIME in the last 168 hours. Thyroid Function Tests: No results for input(s): TSH, T4TOTAL, FREET4, T3FREE, THYROIDAB in the last 72 hours. Lipid Profile: No results for input(s): CHOL, HDL, LDLCALC, TRIG, CHOLHDL, LDLDIRECT in the last 72 hours. Anemia Panel: No results  for input(s): VITAMINB12, FOLATE, FERRITIN, TIBC, IRON, RETICCTPCT in the last 72 hours. Urine analysis:    Component Value Date/Time   COLORURINE AMBER (A) 02/10/2020 0924   APPEARANCEUR HAZY (A) 02/10/2020 0924   LABSPEC 1.027 02/10/2020 0924   PHURINE 5.0 02/10/2020 0924   GLUCOSEU NEGATIVE 02/10/2020 0924   HGBUR NEGATIVE 02/10/2020 0924   BILIRUBINUR NEGATIVE 02/10/2020 0924   KETONESUR NEGATIVE 02/10/2020 0924   PROTEINUR NEGATIVE 02/10/2020 0924   UROBILINOGEN 0.2 05/15/2014 0858   NITRITE NEGATIVE 02/10/2020 0924   LEUKOCYTESUR LARGE (A) 02/10/2020 0924   Sepsis Labs: Invalid input(s): PROCALCITONIN, LACTICIDVEN  Microbiology: Recent Results (from the past 240 hour(s))  Respiratory Panel by RT PCR (Flu A&B, Covid) - Nasopharyngeal Swab     Status: None   Collection Time: 02/04/20 12:58 PM   Specimen: Nasopharyngeal Swab  Result Value Ref Range Status   SARS Coronavirus 2 by RT PCR NEGATIVE NEGATIVE Final    Comment: (NOTE) SARS-CoV-2 target nucleic acids are NOT DETECTED.  The SARS-CoV-2 RNA is generally detectable in upper respiratoy specimens during the acute phase of infection. The lowest concentration of SARS-CoV-2 viral copies this assay can detect is 131 copies/mL. A negative result does not preclude SARS-Cov-2 infection and should not be used as the sole basis for treatment or other patient management decisions. A negative result may occur with  improper specimen collection/handling, submission of specimen other than nasopharyngeal swab, presence of viral mutation(s) within the areas targeted by this assay, and inadequate number of viral copies (<131 copies/mL). A negative result must be combined with clinical observations, patient history, and epidemiological information. The expected result is Negative.  Fact Sheet for Patients:  https://www.moore.com/https://www.fda.gov/media/142436/download  Fact Sheet for Healthcare Providers:   https://www.young.biz/https://www.fda.gov/media/142435/download  This test is no t yet approved or cleared by the [place] FDA and  has been authorized for detection and/or diagnosis of SARS-CoV-2 by FDA under an Emergency Use Authorization (EUA). This EUA will remain  in effect (meaning this test can be used) for the duration of the COVID-19 declaration under Section 564(b)(1) of  the Act, 21 U.S.C. section 360bbb-3(b)(1), unless the authorization is terminated or revoked sooner.     Influenza A by PCR NEGATIVE NEGATIVE Final   Influenza B by PCR NEGATIVE NEGATIVE Final    Comment: (NOTE) The Xpert Xpress SARS-CoV-2/FLU/RSV assay is intended as an aid in  the diagnosis of influenza from Nasopharyngeal swab specimens and  should not be used as a sole basis for treatment. Nasal washings and  aspirates are unacceptable for Xpert Xpress SARS-CoV-2/FLU/RSV  testing.  Fact Sheet for Patients: https://www.moore.com/  Fact Sheet for Healthcare Providers: https://www.young.biz/  This test is not yet approved or cleared by the [place] FDA and  has been authorized for detection and/or diagnosis of SARS-CoV-2 by  FDA under an Emergency Use Authorization (EUA). This EUA will remain  in effect (meaning this test can be used) for the duration of the  Covid-19 declaration under Section 564(b)(1) of the Act, 21  U.S.C. section 360bbb-3(b)(1), unless the authorization is  terminated or revoked. Performed at [facility], [address] [place], [place] [address]   Urine culture     Status: Abnormal   Collection Time: 02/04/20  8:12 PM   Specimen: Urine, Random  Result Value Ref Range Status   Specimen Description URINE, RANDOM  Final   Special Requests   Final    NONE Performed at [facility], [address] [place], [place] [address]    Culture MULTIPLE SPECIES PRESENT, SUGGEST RECOLLECTION (A)  Final   Report Status 02/06/2020 FINAL  Final   Respiratory Panel by RT PCR (Flu A&B, Covid) - Nasopharyngeal Swab     Status: None   Collection Time: 02/08/20 11:19 PM   Specimen: Nasopharyngeal Swab  Result Value Ref Range Status   SARS Coronavirus 2 by RT PCR NEGATIVE NEGATIVE Final    Comment: (NOTE) SARS-CoV-2 target nucleic acids are NOT DETECTED.  The SARS-CoV-2 RNA is generally detectable in upper respiratoy specimens during the acute phase of infection. The lowest concentration of SARS-CoV-2 viral copies this assay can detect is 131 copies/mL. A negative result does not preclude SARS-Cov-2 infection and should not be used as the sole basis for treatment or other patient management decisions. A negative result may occur with  improper specimen collection/handling, submission of specimen other than nasopharyngeal swab, presence of viral mutation(s) within the areas targeted by this assay, and inadequate number of viral copies (<131 copies/mL). A negative result must be combined with clinical observations, patient history, and epidemiological information. The expected result is Negative.  Fact Sheet for Patients:  https://www.moore.com/  Fact Sheet for Healthcare Providers:  https://www.young.biz/  This test is no t yet approved or cleared by the [place] FDA and  has been authorized for detection and/or diagnosis of SARS-CoV-2 by FDA under an Emergency Use Authorization (EUA). This EUA will remain  in effect (meaning this test can be used) for the duration of the COVID-19 declaration under Section 564(b)(1) of the Act, 21 U.S.C. section 360bbb-3(b)(1), unless the authorization is terminated or revoked sooner.     Influenza A by PCR NEGATIVE NEGATIVE Final   Influenza B by PCR NEGATIVE NEGATIVE Final    Comment: (NOTE) The Xpert Xpress SARS-CoV-2/FLU/RSV assay is intended as an aid in  the diagnosis of influenza from Nasopharyngeal swab specimens and  should not be used as  a sole basis for treatment. Nasal washings and  aspirates are unacceptable for Xpert Xpress SARS-CoV-2/FLU/RSV  testing.  Fact Sheet for Patients: https://www.moore.com/  Fact Sheet for Healthcare Providers: https://www.young.biz/  This test is not yet approved or cleared by the [place] FDA and  has been authorized for detection and/or diagnosis of SARS-CoV-2 by  FDA under an Emergency Use Authorization (EUA). This EUA will remain  in effect (meaning this test can be used) for the duration of the  Covid-19 declaration under Section 564(b)(1) of the Act, 21  U.S.C. section 360bbb-3(b)(1), unless the authorization is  terminated or revoked. Performed at [facility], [address], [place], [place] [address]     Radiology Studies: No results found.    Lynnsie Linders T. [facility]  If 7PM-7AM, please contact night-coverage [email or web address] 02/11/2020, 12:01 PM

## 2020-02-11 NOTE — Progress Notes (Signed)
Physical Therapy Treatment Patient Details Name: Jenna Murphy MRN: [id number] DOB: Sep 06, 1927 Today's Date: 02/11/2020    History of Present Illness Jenna Murphy is a 86 y.o. female with medical history significant of noninsulin-dependent type 2 diabetes, gallstone pancreatitis, hypertension, hyperlipidemia, chronic diastolic CHF, CKD stage IIIa presenting to the ED after a fall.  Patient was discharged from the hospital on 02/07/2020 after being treated for CHF exacerbation and AKI.  Patient states she was in the bathroom wearing silk pajamas.  Somehow her foot slipped on the fabric and she fell to the side of the tub and injured her back and left side.  Workup reveals displaced left L1-L4 transverse process fractures and fractures of the posterolateral left 10th and 11th ribs.    PT Comments    Pt supine on arrival, pleasantly confused but cooperative and with good effort put forth during session. Pt performed bed mobility and transfers with up to modA, and benefits from elevated bed height when standing due to BLE weakness/LBP. Pt progressed gait distance to 78ft x2 using RW and min to modA, with multiple posterior LOB and needs multimodal cues for improved safety with RW use and positioning. Pt with poor recall/understanding of back precautions however female family member present during session and receptive to instruction on precautions. Pt able to pull from ~750 up to 1000 on incentive spirometry. Pt continues to benefit from PT services to progress toward functional mobility goals. D/C recs below, updated per discussion with supervising PT Margie M.  Follow Up Recommendations  SNF;Supervision/Assistance - 24 hour;Supervision for mobility/OOB     Equipment Recommendations  None recommended by PT (defer to next location)    Recommendations for Other Services       Precautions / Restrictions Precautions Precautions: Back;Fall Precaution Booklet Issued: No Precaution Comments: No  brace Restrictions Weight Bearing Restrictions: No    Mobility  Bed Mobility Overal bed mobility: Needs Assistance Bed Mobility: Supine to Sit;Sit to Sidelying     Supine to sit: Mod assist;HOB elevated   Sit to sidelying: Min assist;+2 for physical assistance General bed mobility comments: cues for log roll sequencing with fair carryover, greatly increased pain with sit to supine transition despite log rolling technique, pt needs cues for relaxation techniques/slowed breathing as she tends to clench/hold breath due to pain  Transfers Overall transfer level: Needs assistance Equipment used: Rolling walker (2 wheeled) Transfers: Sit to/from Stand Sit to Stand: From elevated surface;Mod assist;Min assist;+2 safety/equipment         General transfer comment: from EOB needing modA first attempt and bed height elevated; with +2 assist and multimodal cues, pt able to stand from elevated EOB with +50minA x3 more reps  Ambulation/Gait Ambulation/Gait assistance: Mod assist Gait Distance (Feet): 30 Feet (13ft, seated break, 27ft) Assistive device: Rolling walker (2 wheeled) Gait Pattern/deviations: Step-through pattern;Leaning posteriorly;Drifts right/left;Trunk flexed;Shuffle;Decreased stride length Gait velocity: decreased   General Gait Details: pt having difficulty maintaining proximity to RW and on first gait trial, had multiple posterior LOB requiring modA to correct; on second gait trial, fewer LOB but needs mod cues for upright posture and assist to manage RW   Stairs             Wheelchair Mobility    Modified Rankin (Stroke Patients Only)       Balance Overall balance assessment: Needs assistance Sitting-balance support: No upper extremity supported;Feet supported Sitting balance-Leahy Scale: Fair Sitting balance - Comments: pt at times impulsive and needs cues for safety/UE support  as increased pain/coughing and weight shifting can cause her to need minA to  maintain balance   Standing balance support: Bilateral upper extremity supported Standing balance-Leahy Scale: Poor Standing balance comment: reliant on RW for balance, pt able to stand at sink with BUE forearm support of sink while washing hands                            Cognition Arousal/Alertness: Awake/alert Behavior During Therapy: [facility] for tasks assessed/performed;Restless Overall Cognitive Status: History of cognitive impairments - at baseline                                 General Comments: Pt with frequent word-finding difficulty      Exercises      General Comments General comments (skin integrity, edema, etc.): pt at times impulsive and needs min guard for safety while seated and min to modA for standing balance at RW      Pertinent Vitals/Pain Pain Assessment: Faces Faces Pain Scale: Hurts whole lot Pain Location: back, side trunk, at worst with bed mobility/rolling Pain Descriptors / Indicators: Moaning;Guarding;Grimacing Pain Intervention(s): Monitored during session;RN gave pain meds during session;Repositioned  SpO2 93% on RA and HR 80's bpm during mobility tasks.  Home Living                      Prior Function            PT Goals (current goals can now be found in the care plan section) Acute Rehab PT Goals Patient Stated Goal: Stop hurting and move better PT Goal Formulation: With patient/family Time For Goal Achievement: 02/16/20 Potential to Achieve Goals: Good Progress towards PT goals: Progressing toward goals    Frequency    Min 4X/week      PT Plan Current plan remains appropriate    Co-evaluation              AM-PAC PT "6 Clicks" Mobility   Outcome Measure  Help needed turning from your back to your side while in a flat bed without using bedrails?: A Little Help needed moving from lying on your back to sitting on the side of a flat bed without using bedrails?: A Little Help needed moving to  and from a bed to a chair (including a wheelchair)?: A Little Help needed standing up from a chair using your arms (e.g., wheelchair or bedside chair)?: A Lot Help needed to walk in hospital room?: A Lot Help needed climbing 3-5 steps with a railing? : A Lot 6 Click Score: 15    End of Session Equipment Utilized During Treatment: Gait belt Activity Tolerance: Patient tolerated treatment well Patient left: in bed;with call bell/phone within reach;with bed alarm set;with family/visitor present;with nursing/sitter in room (heels floated, sitter present, family member present) Nurse Communication: Mobility status PT Visit Diagnosis: Unsteadiness on feet (R26.81);History of falling (Z91.81);Muscle weakness (generalized) (M62.81)     Time: 9528-4132 PT Time Calculation (min) (ACUTE ONLY): 52 min  Charges:  $Gait Training: 23-37 mins $Therapeutic Activity: 8-22 mins                     Czarina Gingras P., PTA Acute Rehabilitation Services Pager: [phone] Office: [phone]   Angus Palms 02/11/2020, 3:57 PM

## 2020-02-11 NOTE — TOC Transition Note (Signed)
Transition of Care [facility]) - CM/SW Discharge Note   Patient Details  Name: TIENA MANANSALA MRN: [id number] Date of Birth: 17-Nov-1927  Transition of Care [facility]) CM/SW Contact:  Emeterio Reeve, [place] Phone Number: 02/11/2020, 4:14 PM   Clinical Narrative:     CSW met with pt at bedside. CSW introduced self and explained her role at the hospital.  Pt deferred all questions to [facility], Daughter in [employer] coach.  Rhea stated that pt has been living in her apartment alone. PTA pt was independent. Pt was walking and completing all ADL's. Rhea reports more incidents of confusion but still independent. Pt has received covid 19 vaccine.  CSW discussed PT/OT reccs of SNF. Berton Lan stated that her and her husband has already discussed SNF would be best for pt right now. Rhea said they have a preference for [facility] but they are open to exploring other facilities. CSW gave [employer].gov ratings list to review.  TOC will continue to follow.    Barriers to Discharge: Continued Medical Work up   Patient Goals and CMS Choice Patient states their goals for this hospitalization and ongoing recovery are:: to get better CMS Medicare.gov Compare Post Acute Care list provided to:: Patient Choice offered to / list presented to : Adult Children  Discharge Placement                       Discharge Plan and Services                                     Social Determinants of Health (SDOH) Interventions     Readmission Risk Interventions No flowsheet data found.  Emeterio Reeve, Latanya Presser, [place] Social Worker [phone]

## 2020-02-11 NOTE — Progress Notes (Signed)
Notified X. Blount that pt smacking lips and stating I am dry. Drank sips of water with assistance. Pt very confused and restless during the night.. There is documented void once in 24 hrs. Up with assist x 2 and  voided concentrated yellow urine on BSC @ 0300 and incontinent for approximately 50 mL . Pt pulled IV out and new one started and wrapped with kling wrap for safety. RN will continue to monitor.

## 2020-02-12 LAB — RENAL FUNCTION PANEL
Albumin: 2.9 g/dL — ABNORMAL LOW (ref 3.5–5.0)
Anion gap: 11 (ref 5–15)
BUN: 30 mg/dL — ABNORMAL HIGH (ref 8–23)
CO2: 18 mmol/L — ABNORMAL LOW (ref 22–32)
Calcium: 8.9 mg/dL (ref 8.9–10.3)
Chloride: 110 mmol/L (ref 98–111)
Creatinine, Ser: 1.8 mg/dL — ABNORMAL HIGH (ref 0.44–1.00)
GFR, Estimated: 26 mL/min — ABNORMAL LOW (ref >60)
Glucose, Bld: 107 mg/dL — ABNORMAL HIGH (ref 70–99)
Phosphorus: 3.5 mg/dL (ref 2.5–4.6)
Potassium: 3.5 mmol/L (ref 3.5–5.1)
Sodium: 139 mmol/L (ref 135–145)

## 2020-02-12 LAB — GLUCOSE, CAPILLARY
Glucose-Capillary: 129 mg/dL — ABNORMAL HIGH (ref 70–99)
Glucose-Capillary: 166 mg/dL — ABNORMAL HIGH (ref 70–99)
Glucose-Capillary: 112 mg/dL — ABNORMAL HIGH (ref 70–99)
Glucose-Capillary: 153 mg/dL — ABNORMAL HIGH (ref 70–99)

## 2020-02-12 LAB — MAGNESIUM: Magnesium: 2.1 mg/dL (ref 1.7–2.4)

## 2020-02-12 MED ORDER — SODIUM CHLORIDE 0.9 % IV SOLN: INTRAVENOUS | Status: DC

## 2020-02-12 NOTE — Progress Notes (Signed)
Occupational Therapy Treatment Patient Details Name: Jenna Murphy MRN: [id number] DOB: [birth year] Today's Date: 02/12/2020    History of present illness Jenna Murphy is a 86 y.o. female with medical history significant of noninsulin-dependent type 2 diabetes, gallstone pancreatitis, hypertension, hyperlipidemia, chronic diastolic CHF, CKD stage IIIa presenting to the ED after a fall.  Patient was discharged from the hospital on 02/07/2020 after being treated for CHF exacerbation and AKI.  Patient states she was in the bathroom wearing silk pajamas.  Somehow her foot slipped on the fabric and she fell to the side of the tub and injured her back and left side.  Workup reveals displaced left L1-L4 transverse process fractures and fractures of the posterolateral left 10th and 11th ribs.   OT comments  Pt progressing gradually with OT goals. Provided handout for back precautions with continued education needed to improve carryover. Pt pleasant, noted with impulsivity and poor safety awareness often removing hands from walker requiring external correction from therapist to prevent fall. Pt with multiple posterior LOB throughout session, especially with transitional movements posing high fall risk. Pt able to demo mobility in hallway with overall Min A, cueing for attention to task. Pt continues to require Mod A for LB ADLs with continued reinforcement needed to maintain back precautions. Continue to recommend DC to SNF for short term rehab.    Follow Up Recommendations  SNF;Supervision/Assistance - 24 hour    Equipment Recommendations  3 in 1 bedside commode    Recommendations for Other Services      Precautions / Restrictions Precautions Precautions: Back;Fall Precaution Booklet Issued: Yes (comment) Precaution Comments: No brace Restrictions Weight Bearing Restrictions: No       Mobility Bed Mobility               General bed mobility comments: received sitting on couch with  sitter present  Transfers Overall transfer level: Needs assistance Equipment used: Rolling walker (2 wheeled) Transfers: Sit to/from Stand Sit to Stand: Min assist         General transfer comment: Overall Min A for sit to stand transfers. Once instance of Mod A from low bench in hallway with external assist to gain balance    Balance Overall balance assessment: Needs assistance;History of Falls Sitting-balance support: No upper extremity supported;Feet supported     Postural control: Posterior lean Standing balance support: Bilateral upper extremity supported Standing balance-Leahy Scale: Poor Standing balance comment: reliant on B UE, frequent posterior LOB                           ADL either performed or assessed with clinical judgement   ADL Overall ADL's : Needs assistance/impaired                     Lower Body Dressing: Moderate assistance;Sit to/from stand;Sitting/lateral leans Lower Body Dressing Details (indicate cue type and reason): Mod A overall. Pt attempting to don shoes in standing (slip on) but very unsteady, will require increased assist for donning socks, pants around feet without bending             Functional mobility during ADLs: Minimal assistance;Rolling walker;Cueing for sequencing;Cueing for safety General ADL Comments: Limited by impaired safety, decreased short term memory, and multiple posterior LOB     Vision   Vision Assessment?: No apparent visual deficits   Perception     Praxis      Cognition Arousal/Alertness: Awake/alert Behavior During Therapy:  WFL for tasks assessed/performed;Restless Overall Cognitive Status: History of cognitive impairments - at baseline Area of Impairment: Attention;Memory;Following commands;Safety/judgement;Awareness;Problem solving                   Current Attention Level: Selective Memory: Decreased recall of precautions;Decreased short-term memory Following Commands:  Follows one step commands with increased time Safety/Judgement: Decreased awareness of safety;Decreased awareness of deficits Awareness: Emergent Problem Solving: Difficulty sequencing;Requires verbal cues;Requires tactile cues General Comments: Pt with impaired safety, distractible and at high risk for falls. No recall of back precautions, handout provided and pt did reference the sheet once but unsure if fully understood back precautions        Exercises     Shoulder Instructions       General Comments Son entering mid-session. Encouraged sitter to use gait belt to maximize safety    Pertinent Vitals/ Pain       Pain Assessment: Faces Faces Pain Scale: Hurts a little bit Pain Location: back Pain Descriptors / Indicators: Moaning;Guarding;Grimacing Pain Intervention(s): Limited activity within patient's tolerance;Monitored during session  Home Living                                          Prior Functioning/Environment              Frequency  Min 2X/week        Progress Toward Goals  OT Goals(current goals can now be found in the care plan section)  Progress towards OT goals: Progressing toward goals  Acute Rehab OT Goals Patient Stated Goal: go to rehab and go home OT Goal Formulation: With patient Time For Goal Achievement: 02/25/20 Potential to Achieve Goals: Fair ADL Goals Pt Will Perform Grooming: with set-up;sitting Pt Will Perform Lower Body Bathing: with supervision;sit to/from stand Pt Will Perform Lower Body Dressing: with supervision;sit to/from stand Pt Will Transfer to Toilet: with supervision;ambulating;regular height toilet  Plan Discharge plan remains appropriate    Co-evaluation                 AM-PAC OT "6 Clicks" Daily Activity     Outcome Measure   Help from another person eating meals?: None Help from another person taking care of personal grooming?: A Little Help from another person toileting, which  includes using toliet, bedpan, or urinal?: A Lot Help from another person bathing (including washing, rinsing, drying)?: A Lot Help from another person to put on and taking off regular upper body clothing?: A Lot Help from another person to put on and taking off regular lower body clothing?: A Lot 6 Click Score: 15    End of Session Equipment Utilized During Treatment: Rolling walker;Gait belt  OT Visit Diagnosis: Unsteadiness on feet (R26.81);History of falling (Z91.81)   Activity Tolerance Patient tolerated treatment well   Patient Left Other (comment) (sitting on couch with sitter and son present)   Nurse Communication          Time: 6256-3893 OT Time Calculation (min): 30 min  Charges: OT General Charges $OT Visit: 1 Visit OT Treatments $Self Care/Home Management : 8-22 mins $Therapeutic Activity: 8-22 mins  Lorre Munroe, OTR/L   Lorre Munroe 02/12/2020, 10:36 AM

## 2020-02-12 NOTE — Progress Notes (Signed)
PROGRESS NOTE  Jenna Murphy:[id number] DOB: 21-May-1927   PCP: Andi Devon, MD  Patient is from: Home.  Lives alone.  Uses walker at baseline.  DOA: 02/08/2020 LOS: 3  Chief complaints: Accidental fall and back pain  Brief Narrative / Interim history: 86 year old female with history of NIDDM-2, goals of pancreatitis, diastolic CHF, CKD-3A, HLD and recent hospitalization for CHF exacerbation and AKI returning with left-sided back pain after accidental fall at home.  Reportedly had slipped and fell in the bathtub.  No head trauma or loss of consciousness.  In ED, hemodynamically stable.  Hgb 10.6 (at baseline). Cr 1.8 (b/l ~1.5).  CT head, cervical spine and thoracic spine without acute finding.  CT lumbar spine showed displaced left L1-L4 transverse process fractures.  CT chest, abdomen and pelvis showed minimally displaced fractures of the posterolateral left 10th and 11th ribs.  Admitted for pain control and therapy.  Hospitalization complicated by acute encephalopathy likely toxic from pain medications or delirium.  She might have some underlying dementia based on family's report.  Encephalopathy improved, probably back to baseline. Can to go to SNF as early as 02/13/2020 if renal function improves.  Subjective: Seen and examined earlier this morning.  No major events overnight of this morning.  Encephalopathy seems to have resolved.  Objective: Vitals:   02/11/20 1631 02/11/20 2037 02/12/20 0335 02/12/20 0731  BP: (!) 113/55 (!) 103/54 109/61 (!) 149/85  Pulse: (!) 59 69 61 89  Resp: 14 17 16 20   Temp: 98 F (36.7 C) 97.9 F (36.6 C) 98 F (36.7 C) 97.8 F (36.6 C)  TempSrc: Oral Oral Oral Oral  SpO2: 93% 98% 99% 96%  Weight:      Height:        Intake/Output Summary (Last 24 hours) at 02/12/2020 1311 Last data filed at 02/12/2020 1100 Gross per 24 hour  Intake 864.29 ml  Output 850 ml  Net 14.29 ml   Filed Weights   02/08/20 1844  Weight: 68.9 kg     Examination:  GENERAL: Restless and fidgeting.  Nontoxic. HEENT: MMM.  Vision and hearing grossly intact.  NECK: Supple.  No apparent JVD.  RESP: On room air.  No IWOB.  Fair aeration bilaterally. CVS:  RRR. Heart sounds normal.  ABD/GI/GU: BS+. Abd soft, NTND.  MSK/EXT:  Moves extremities. No apparent deformity. No edema.  SKIN: no apparent skin lesion or wound NEURO: Awake and alert but confused.  Oriented to self and place only. Seems to have occasional ataxia. PSYCH: Restless and fidgeting. Procedures:  None  Microbiology summarized: COVID-19 PCR negative. Influenza PCR negative.  Assessment & Plan: Accidental fall at home-slipped & fell in bathtub. Not head trauma or LOC.  CT head & cervical spine negative Displaced left L1-L4 transverse process fractures  Traumatic rib fracture-CT showed mildly displaced fractures of left 10th and 11th ribs posterolaterally -K pad, Lidoderm, scheduled Tylenol.  Discontinued baclofen and opiates in the setting of encephalopathy -Bowel regimen and antiemetics -Encourage incentive spirometry  Acute encephalopathy/agitation/ataxia: Likely due to baclofen, opiates, and possibly delirium.  Seems to have resolved.  She is oriented x4 except to date.  No longer restless or fidgeting. -Continue scheduled Tylenol, K pad and Lidoderm -Avoid baclofen, opiates or any other sedating medications. -Reorientation and delirium precautions -Fall and delirium precautions -Discontinue one-to-one 02/10/20.  Telesitter over the next 12 hours  Chronic diastolic CHF: Appears euvolemic.  No acute cardiopulmonary symptoms.  She has AKI. -Closely monitor fluid status and respiratory status  while on IV fluid -Continue holding torsemide in the setting of AKI  AKI on CKD stage IIIa: Cr 1.8 (admit)>> 1.41> 1.76> 1.8.  Baseline~1.2.  Likely prerenal due to poor p.o. -Increase IV NS to 100 cc an hour over the next 24 hours. -Avoid nephrotoxic meds -Renal  ultrasound if no improvement.  Normal anion gap metabolic acidosis: Likely due to AKI.  Improved. -Continue monitoring  Uncontrolled IDDM-2 with hyperglycemia: A1c 7.6% Recent Labs  Lab 02/11/20 1130 02/11/20 1641 02/11/20 2035 02/12/20 0622 02/12/20 1127  GLUCAP 100* 144* 147* 129* 166*  -Continue SSI-moderate with nightly coverage -Continue Lantus 10 units twice daily.  On higher dose at home. -Continue home statin  Hypertension: Normotensive for most part. -Continue metoprolol and BiDil.  Hyperlipidemia -Continue Lipitor  Pulmonary nodule: CT showing a 3 mm right upper lobe pulmonary nodule.  -noncontrast chest CT in 12 months for follow-up.  Debility/physical deconditioning -OT recommended SNF.  PT recommends home health.  Family interested in SNF. -TOC consulted for SNF.  Body mass index is 26.09 kg/m.         DVT prophylaxis:  SCDs Start: 02/09/20 0410  Code Status: Full code Family Communication: Updated patient's daughter over the phone. Status is: Inpatient  Remains inpatient appropriate because:Altered mental status, Unsafe d/c plan and Inpatient level of care appropriate due to severity of illness   Dispo: The patient is from: Home              Anticipated d/c is to: SNF              Anticipated d/c date is: 2 days              Patient currently is not medically stable to d/c.       Consultants:  None   Sch Meds:  Scheduled Meds: . acetaminophen  1,000 mg Oral Q8H  . atorvastatin  20 mg Oral q1800  . insulin aspart  0-15 Units Subcutaneous TID WC  . insulin aspart  0-5 Units Subcutaneous QHS  . isosorbide-hydrALAZINE  1 tablet Oral TID  . lidocaine  1 patch Transdermal Q24H  . metoprolol tartrate  75 mg Oral BID   Continuous Infusions: . sodium chloride 100 mL/hr at 02/12/20 0831   PRN Meds:.ondansetron (ZOFRAN) IV, polyethylene glycol, senna-docusate  Antimicrobials: Anti-infectives (From admission, onward)   None        I have personally reviewed the following labs and images: CBC: Recent Labs  Lab 02/08/20 2218 02/09/20 0858 02/10/20 1528 02/11/20 0333  WBC 11.4* 9.3  --  9.1  NEUTROABS 4.8  --   --   --   HGB 10.6* 10.7* 10.3* 10.5*  HCT 34.0* 34.0* 32.7* 33.7*  MCV 81.3 80.4  --  81.6  PLT 310 293  --  320   BMP &GFR Recent Labs  Lab 02/08/20 2218 02/09/20 0858 02/10/20 1150 02/10/20 1528 02/11/20 0333 02/12/20 0231  NA 141 138 140  --  140 139  K 4.1 3.8 3.6  --  4.5 3.5  CL 108 107 108  --  108 110  CO2 19* 18* 22  --  21* 18*  GLUCOSE 131* 134* 141*  --  107* 107*  BUN 25* 19 26*  --  27* 30*  CREATININE 1.80* 1.41* 1.70*  --  1.76* 1.80*  CALCIUM 9.1 9.0 9.2  --  9.1 8.9  MG  --  1.9  --  2.1 2.2 2.1  PHOS  --  3.4 4.4  --  4.5 3.5   Estimated Creatinine Clearance: 19.8 mL/min (A) (by C-G formula based on SCr of 1.8 mg/dL (H)). Liver & Pancreas: Recent Labs  Lab 02/08/20 2218 02/09/20 0858 02/10/20 1150 02/11/20 0333 02/12/20 0231  AST 28  --   --   --   --   ALT 21  --   --   --   --   ALKPHOS 81  --   --   --   --   BILITOT 1.1  --   --   --   --   PROT 7.0  --   --   --   --   ALBUMIN 3.6 3.4* 3.2* 3.2* 2.9*   Recent Labs  Lab 02/08/20 2218  LIPASE 29   No results for input(s): AMMONIA in the last 168 hours. Diabetic: No results for input(s): HGBA1C in the last 72 hours. Recent Labs  Lab 02/11/20 1130 02/11/20 1641 02/11/20 2035 02/12/20 0622 02/12/20 1127  GLUCAP 100* 144* 147* 129* 166*   Cardiac Enzymes: No results for input(s): CKTOTAL, CKMB, CKMBINDEX, TROPONINI in the last 168 hours. No results for input(s): PROBNP in the last 8760 hours. Coagulation Profile: No results for input(s): INR, PROTIME in the last 168 hours. Thyroid Function Tests: No results for input(s): TSH, T4TOTAL, FREET4, T3FREE, THYROIDAB in the last 72 hours. Lipid Profile: No results for input(s): CHOL, HDL, LDLCALC, TRIG, CHOLHDL, LDLDIRECT in the last 72  hours. Anemia Panel: No results for input(s): VITAMINB12, FOLATE, FERRITIN, TIBC, IRON, RETICCTPCT in the last 72 hours. Urine analysis:    Component Value Date/Time   COLORURINE AMBER (A) 02/10/2020 0924   APPEARANCEUR HAZY (A) 02/10/2020 0924   LABSPEC 1.027 02/10/2020 0924   PHURINE 5.0 02/10/2020 0924   GLUCOSEU NEGATIVE 02/10/2020 0924   HGBUR NEGATIVE 02/10/2020 0924   BILIRUBINUR NEGATIVE 02/10/2020 0924   KETONESUR NEGATIVE 02/10/2020 0924   PROTEINUR NEGATIVE 02/10/2020 0924   UROBILINOGEN 0.2 05/15/2014 0858   NITRITE NEGATIVE 02/10/2020 0924   LEUKOCYTESUR LARGE (A) 02/10/2020 0924   Sepsis Labs: Invalid input(s): PROCALCITONIN, LACTICIDVEN  Microbiology: Recent Results (from the past 240 hour(s))  Respiratory Panel by RT PCR (Flu A&B, Covid) - Nasopharyngeal Swab     Status: None   Collection Time: 02/04/20 12:58 PM   Specimen: Nasopharyngeal Swab  Result Value Ref Range Status   SARS Coronavirus 2 by RT PCR NEGATIVE NEGATIVE Final    Comment: (NOTE) SARS-CoV-2 target nucleic acids are NOT DETECTED.  The SARS-CoV-2 RNA is generally detectable in upper respiratoy specimens during the acute phase of infection. The lowest concentration of SARS-CoV-2 viral copies this assay can detect is 131 copies/mL. A negative result does not preclude SARS-Cov-2 infection and should not be used as the sole basis for treatment or other patient management decisions. A negative result may occur with  improper specimen collection/handling, submission of specimen other than nasopharyngeal swab, presence of viral mutation(s) within the areas targeted by this assay, and inadequate number of viral copies (<131 copies/mL). A negative result must be combined with clinical observations, patient history, and epidemiological information. The expected result is Negative.  Fact Sheet for Patients:  https://www.moore.com/  Fact Sheet for Healthcare Providers:   https://www.young.biz/  This test is no t yet approved or cleared by the [place] FDA and  has been authorized for detection and/or diagnosis of SARS-CoV-2 by FDA under an Emergency Use Authorization (EUA). This EUA will remain  in effect (meaning this test can be used) for the  duration of the COVID-19 declaration under Section 564(b)(1) of the Act, 21 U.S.C. section 360bbb-3(b)(1), unless the authorization is terminated or revoked sooner.     Influenza A by PCR NEGATIVE NEGATIVE Final   Influenza B by PCR NEGATIVE NEGATIVE Final    Comment: (NOTE) The Xpert Xpress SARS-CoV-2/FLU/RSV assay is intended as an aid in  the diagnosis of influenza from Nasopharyngeal swab specimens and  should not be used as a sole basis for treatment. Nasal washings and  aspirates are unacceptable for Xpert Xpress SARS-CoV-2/FLU/RSV  testing.  Fact Sheet for Patients: https://www.moore.com/  Fact Sheet for Healthcare Providers: https://www.young.biz/  This test is not yet approved or cleared by the [place] FDA and  has been authorized for detection and/or diagnosis of SARS-CoV-2 by  FDA under an Emergency Use Authorization (EUA). This EUA will remain  in effect (meaning this test can be used) for the duration of the  Covid-19 declaration under Section 564(b)(1) of the Act, 21  U.S.C. section 360bbb-3(b)(1), unless the authorization is  terminated or revoked. Performed at [facility], [address], [place], [place] [address]   Urine culture     Status: Abnormal   Collection Time: 02/04/20  8:12 PM   Specimen: Urine, Random  Result Value Ref Range Status   Specimen Description URINE, RANDOM  Final   Special Requests   Final    NONE Performed at [facility], [address], [place], [place] [address]    Culture MULTIPLE SPECIES PRESENT, SUGGEST RECOLLECTION (A)  Final   Report Status 02/06/2020 FINAL  Final   Respiratory Panel by RT PCR (Flu A&B, Covid) - Nasopharyngeal Swab     Status: None   Collection Time: 02/08/20 11:19 PM   Specimen: Nasopharyngeal Swab  Result Value Ref Range Status   SARS Coronavirus 2 by RT PCR NEGATIVE NEGATIVE Final    Comment: (NOTE) SARS-CoV-2 target nucleic acids are NOT DETECTED.  The SARS-CoV-2 RNA is generally detectable in upper respiratoy specimens during the acute phase of infection. The lowest concentration of SARS-CoV-2 viral copies this assay can detect is 131 copies/mL. A negative result does not preclude SARS-Cov-2 infection and should not be used as the sole basis for treatment or other patient management decisions. A negative result may occur with  improper specimen collection/handling, submission of specimen other than nasopharyngeal swab, presence of viral mutation(s) within the areas targeted by this assay, and inadequate number of viral copies (<131 copies/mL). A negative result must be combined with clinical observations, patient history, and epidemiological information. The expected result is Negative.  Fact Sheet for Patients:  https://www.moore.com/  Fact Sheet for Healthcare Providers:  https://www.young.biz/  This test is no t yet approved or cleared by the [place] FDA and  has been authorized for detection and/or diagnosis of SARS-CoV-2 by FDA under an Emergency Use Authorization (EUA). This EUA will remain  in effect (meaning this test can be used) for the duration of the COVID-19 declaration under Section 564(b)(1) of the Act, 21 U.S.C. section 360bbb-3(b)(1), unless the authorization is terminated or revoked sooner.     Influenza A by PCR NEGATIVE NEGATIVE Final   Influenza B by PCR NEGATIVE NEGATIVE Final    Comment: (NOTE) The Xpert Xpress SARS-CoV-2/FLU/RSV assay is intended as an aid in  the diagnosis of influenza from Nasopharyngeal swab specimens and  should not be used as  a sole basis for treatment. Nasal washings and  aspirates are unacceptable for Xpert Xpress SARS-CoV-2/FLU/RSV  testing.  Fact Sheet for Patients: https://www.moore.com/  Fact Sheet for Healthcare Providers: https://www.young.biz/  This test is not yet approved or cleared by the [place] FDA and  has been authorized for detection and/or diagnosis of SARS-CoV-2 by  FDA under an Emergency Use Authorization (EUA). This EUA will remain  in effect (meaning this test can be used) for the duration of the  Covid-19 declaration under Section 564(b)(1) of the Act, 21  U.S.C. section 360bbb-3(b)(1), unless the authorization is  terminated or revoked. Performed at [facility], [address], [place], [place] [address]     Radiology Studies: No results found.    Reford Olliff T. Hisashi Amadon [facility]  If 7PM-7AM, please contact night-coverage [email or web address] 02/12/2020, 1:11 PM

## 2020-02-12 NOTE — TOC Progression Note (Addendum)
Transition of Care [facility]) - Progression Note    Patient Details  Name: Jenna Murphy MRN: [id number] Date of Birth: 03/11/1928  [facility] CM/SW Contact  Jimmy Picket, [place] Phone Number: 02/12/2020, 3:53 PM  Clinical Narrative:     Pts son was given bed offers and chose [facility]. CSW confirmed that Shel[facility] Hatch can take pt at discharge.   CSW started insurance auth. Navi reference number is [id number]. Pt will need new covid test before discharge.   Expected Discharge Plan: Skilled Nursing Facility Barriers to Discharge: Continued Medical Work up  Expected Discharge Plan and Services Expected Discharge Plan: Skilled Nursing Facility       Living arrangements for the past 2 months: Apartment                                       Social Determinants of Health (SDOH) Interventions    Readmission Risk Interventions No flowsheet data found.  Jimmy Picket, Theresia Majors, [place] Clinical Social Worker [phone]

## 2020-02-12 NOTE — Care Management Important Message (Signed)
Important Message  Patient Details  Name: Jenna Murphy MRN: [id number] Date of Birth: [birth year]   Medicare Important Message Given:  Yes - Important Message mailed due to current National Emergency  Verbal consent obtained due to current National Emergency  Relationship to patient: Self Contact Name: Mercedes Fort Call Date: 02/12/20  Time: 9983 Phone: [phone] Outcome: Spoke with contact Important Message mailed to: Patient address on file    Orson Aloe 02/12/2020, 9:54 AM

## 2020-02-13 LAB — RENAL FUNCTION PANEL
Albumin: 2.9 g/dL — ABNORMAL LOW (ref 3.5–5.0)
Anion gap: 10 (ref 5–15)
BUN: 22 mg/dL (ref 8–23)
CO2: 19 mmol/L — ABNORMAL LOW (ref 22–32)
Calcium: 8.8 mg/dL — ABNORMAL LOW (ref 8.9–10.3)
Chloride: 112 mmol/L — ABNORMAL HIGH (ref 98–111)
Creatinine, Ser: 1.48 mg/dL — ABNORMAL HIGH (ref 0.44–1.00)
GFR, Estimated: 33 mL/min — ABNORMAL LOW (ref >60)
Glucose, Bld: 123 mg/dL — ABNORMAL HIGH (ref 70–99)
Phosphorus: 3.2 mg/dL (ref 2.5–4.6)
Potassium: 4.5 mmol/L (ref 3.5–5.1)
Sodium: 141 mmol/L (ref 135–145)

## 2020-02-13 LAB — GLUCOSE, CAPILLARY
Glucose-Capillary: 174 mg/dL — ABNORMAL HIGH (ref 70–99)
Glucose-Capillary: 98 mg/dL (ref 70–99)

## 2020-02-13 LAB — MAGNESIUM: Magnesium: 2.2 mg/dL (ref 1.7–2.4)

## 2020-02-13 LAB — SARS CORONAVIRUS 2 BY RT PCR (HOSPITAL ORDER, PERFORMED IN ~~LOC~~ HOSPITAL LAB): SARS Coronavirus 2: NEGATIVE

## 2020-02-13 MED ORDER — ZOLPIDEM TARTRATE 5 MG PO TABS: 5.0000 mg | ORAL_TABLET | Freq: Every evening | ORAL | 0 refills | Status: DC | PRN

## 2020-02-13 MED ORDER — METHOCARBAMOL 500 MG PO TABS: 500.0000 mg | ORAL_TABLET | Freq: Three times a day (TID) | ORAL | 0 refills | Status: DC | PRN

## 2020-02-13 MED ORDER — TOUJEO SOLOSTAR 300 UNIT/ML ~~LOC~~ SOPN
8.0000 [IU] | PEN_INJECTOR | Freq: Every day | SUBCUTANEOUS | 0 refills | Status: DC
Start: 2020-02-13 — End: 2020-02-13

## 2020-02-13 MED ORDER — TOUJEO SOLOSTAR 300 UNIT/ML ~~LOC~~ SOPN
15.0000 [IU] | PEN_INJECTOR | Freq: Every day | SUBCUTANEOUS | 0 refills | Status: DC
Start: 2020-02-13 — End: 2020-02-13

## 2020-02-13 MED ORDER — LIDOCAINE 5 % EX PTCH
1.0000 | MEDICATED_PATCH | CUTANEOUS | 0 refills | Status: DC
Start: 2020-02-14 — End: 2020-04-18

## 2020-02-13 MED ORDER — TOUJEO SOLOSTAR 300 UNIT/ML ~~LOC~~ SOPN
8.0000 [IU] | PEN_INJECTOR | Freq: Every day | SUBCUTANEOUS | 0 refills | Status: DC
Start: 2020-02-13 — End: 2020-10-16

## 2020-02-13 NOTE — Progress Notes (Signed)
Pt was given the AVS Discharge paperwork and I went over it with her. She had no further questions. IV was removed with catheter intact. Pt is waiting for [facility] to pick her up to take her to [facility] facility. I tried calling [facility] twice to give report to the nurse but couldn't get the nurse on the phone.

## 2020-02-13 NOTE — Plan of Care (Signed)
?  Problem: Education: ?Goal: Knowledge of General Education information will improve ?Description: Including pain rating scale, medication(s)/side effects and non-pharmacologic comfort measures ?Outcome: Adequate for Discharge ?  ?Problem: Health Behavior/Discharge Planning: ?Goal: Ability to manage health-related needs will improve ?Outcome: Adequate for Discharge ?  ?Problem: Clinical Measurements: ?Goal: Ability to maintain clinical measurements within normal limits will improve ?Outcome: Adequate for Discharge ?Goal: Will remain free from infection ?Outcome: Adequate for Discharge ?Goal: Diagnostic test results will improve ?Outcome: Adequate for Discharge ?Goal: Respiratory complications will improve ?Outcome: Adequate for Discharge ?Goal: Cardiovascular complication will be avoided ?Outcome: Adequate for Discharge ?  ?Problem: Activity: ?Goal: Risk for activity intolerance will decrease ?Outcome: Adequate for Discharge ?  ?Problem: Nutrition: ?Goal: Adequate nutrition will be maintained ?Outcome: Adequate for Discharge ?  ?Problem: Coping: ?Goal: Level of anxiety will decrease ?Outcome: Adequate for Discharge ?  ?Problem: Elimination: ?Goal: Will not experience complications related to bowel motility ?Outcome: Adequate for Discharge ?Goal: Will not experience complications related to urinary retention ?Outcome: Adequate for Discharge ?  ?Problem: Pain Managment: ?Goal: General experience of comfort will improve ?Outcome: Adequate for Discharge ?  ?Problem: Safety: ?Goal: Ability to remain free from injury will improve ?Outcome: Adequate for Discharge ?  ?Problem: Skin Integrity: ?Goal: Risk for impaired skin integrity will decrease ?Outcome: Adequate for Discharge ?  ?Problem: Acute Rehab PT Goals(only PT should resolve) ?Goal: Pt Will Go Supine/Side To Sit ?Outcome: Adequate for Discharge ?Goal: Pt Will Go Sit To Supine/Side ?Outcome: Adequate for Discharge ?Goal: Patient Will Transfer Sit To/From  Stand ?Outcome: Adequate for Discharge ?Goal: Pt Will Ambulate ?Outcome: Adequate for Discharge ?  ?Problem: Acute Rehab OT Goals (only OT should resolve) ?Goal: Pt. Will Perform Grooming ?Outcome: Adequate for Discharge ?Goal: Pt. Will Perform Lower Body Bathing ?Outcome: Adequate for Discharge ?Goal: Pt. Will Perform Lower Body Dressing ?Outcome: Adequate for Discharge ?Goal: Pt. Will Transfer To Toilet ?Outcome: Adequate for Discharge ?  ?

## 2020-02-13 NOTE — Discharge Summary (Addendum)
[facility] Discharge Summary   Patient: Jenna Murphy ZOX:[id number]  PCP: Andi Devon, MD  Date of admission: 02/08/2020   Date of discharge:  02/13/2020     Discharge Diagnoses:  Principal Problem:   Lumbar transverse process fracture [facility]) Active Problems:   Essential hypertension   CHF (congestive heart failure) (HCC)   AKI (acute kidney injury) (HCC)   Fall  Admitted From: home Disposition:  SNF   Recommendations for Outpatient Follow-up:  1. PCP: please follow up with PCP in  1 week 2. Follow up LABS/TEST:  BMP in 1 week   Contact information for follow-up providers    Andi Devon, MD. Schedule an appointment as soon as possible for a visit in 1 week(s).   Specialty: Internal Medicine Contact information: [address] [place] [address] [phone]            Contact information for after-discharge care    Destination    [facility] .   Service: Skilled Nursing Contact information: [address] [place] [address] [phone]                 Diet recommendation: Cardiac diet  Activity: The patient is advised to gradually reintroduce usual activities, as tolerated  Discharge Condition: stable  Code Status: Full code   History of present illness: As per the H and P dictated on admission, " Jenna Murphy is a 86 y.o. female with medical history significant of noninsulin-dependent type 2 diabetes, gallstone pancreatitis, hypertension, hyperlipidemia, chronic diastolic CHF, CKD stage IIIa presenting to the ED after a fall.  Patient was discharged from the hospital on 02/07/2020 after being treated for CHF exacerbation and AKI.  Patient states she was in the bathroom wearing silk pajamas.  Somehow her foot slipped on the fabric and she fell to the side of the tub and injured her back and left side.  Since the fall she is having a lot of pain in her left lower posterior rib region and  lower back.  Denies loss of consciousness.  Denies any other injuries from the fall.  Denies dizziness or chest pain.  Reports having chronic shortness of breath, no recent change.  Reports poor appetite.  No nausea, vomiting, or diarrhea.  ED Course: Hemodynamically stable.  WBC 11.4, hemoglobin 10.6 (no significant change from baseline), hematocrit 34.0, platelet 310k.  Sodium 141, potassium 4.1, chloride 108, bicarb 19, anion gap 14, BUN 25, creatinine 1.8, glucose 131.  SARS-CoV-2 PCR test negative.  Influenza panel negative.  CT lumbar spine showing displaced left L1-L4 transverse process fractures.  CT abdomen pelvis showing minimally displaced fractures of the posterolateral left 10th and 11th ribs.  Patient subsequently had CT chest done which did not show any acute abnormality.  CT abdomen pelvis negative for findings consistent with acute traumatic injury.  CT head negative for acute intracranial abnormality.  CT C-spine negative for findings consistent with acute traumatic injury.  CT thoracic spine negative for acute abnormality.  Case was discussed with Dr. Dwain Sarna from trauma surgery who recommended medical admission."  Hospital Course:  Admitted for pain control and therapy. Hospitalization complicated by acute encephalopathy likely toxic from pain medications or delirium. She might have some underlying dementia based on family's report. Encephalopathy improved, probably back to baseline.  Summary of her active problems in the hospital is as following.  Accidental fall at home- slipped & fell in bathtub. Not head trauma or LOC.   CT head &  cervical spine negative  Displaced left L1-L4 transverse process fractures Traumatic rib fracture CT showed mildly displaced fractures of left 10th and 11th ribs posterolaterally K pad, Lidoderm, scheduled Tylenol.   Discontinued baclofen and opiates in the setting of encephalopathy Bowel regimen and antiemetics Encourage  incentive spirometry  Acute encephalopathy/agitation/ataxia:  Likely due to baclofen, opiates. Seems to have resolved.   No longer restless or fidgeting. Continue scheduled Tylenol, K pad and Lidoderm Avoid baclofen, opiates or any other sedating medications. Reorientation and delirium precautions Fall and delirium precautions  Chronic diastolic CHF:  Appears euvolemic.  No acute cardiopulmonary symptoms.  She has AKI. Continue holding torsemide in the setting of AKI  AKI on CKD stage IIIa:  Cr 1.8 (admit)>> 1.41> 1.76> 1.8.  Baseline~1.2.  Likely prerenal due to poor p.o. Avoid nephrotoxic meds  Normal anion gap metabolic acidosis:  Likely due to AKI.  Improved.  Uncontrolled IDDM-2 with hyperglycemia:  A1c 7.6% Continue Lantus 10 units twice daily.  On higher dose at home. Continue home statin  Hypertension:  Normotensive for most part. Continue metoprolol and BiDil.  Hyperlipidemia Continue Lipitor  Pulmonary nodule:  CT showing a3 mm right upper lobe pulmonary nodule. noncontrast chest CT in 12 months for follow-up.  Debility/physical deconditioning OT recommended SNF.  PT recommends home health.  Family interested in SNF. TOC consulted for SNF.  Insomnia Added ambien  Patient was seen by physical therapy, who recommended SNF,  was arranged. On the day of the discharge the patient's vitals were stable, and no other acute medical condition were reported by patient. The patient was felt safe to be discharge at SNF with therapy.  Consultants: none Procedures: none  Discharge Exam: General: Appear in no distress, no Rash; Oral Mucosa Clear, moist. no Abnormal Neck Mass Or lumps, Conjunctiva normal  Cardiovascular: S1 and S2 Present, no Murmur Respiratory: good respiratory effort, Bilateral Air entry present and CTA, no Crackles, no wheezes Abdomen: Bowel Sound present, Soft and no tenderness Extremities: no Pedal edema Neurology: alert and oriented  to time, place, and person affect appropriate. no new focal deficit  Filed Weights   02/08/20 1844  Weight: 68.9 kg   Vitals:   02/12/20 1948 02/13/20 0439  BP: 133/60 (!) 180/82  Pulse: 72 68  Resp: 17   Temp: 97.7 F (36.5 C) 98.2 F (36.8 C)  SpO2: 97% 96%    DISCHARGE MEDICATION: Allergies as of 02/13/2020   No Known Allergies     Medication List    STOP taking these medications   torsemide 20 MG tablet Commonly known as: DEMADEX     TAKE these medications   atorvastatin 20 MG tablet Commonly known as: LIPITOR Take 20 mg by mouth daily.   cholecalciferol 25 MCG (1000 UNIT) tablet Commonly known as: VITAMIN D3 Take 1,000 Units by mouth daily.   isosorbide-hydrALAZINE 20-37.5 MG tablet Commonly known as: BIDIL Take 1 tablet by mouth 3 (three) times daily.   lidocaine 5 % Commonly known as: LIDODERM Place 1 patch onto the skin daily. Remove & Discard patch within 12 hours or as directed by MD Start taking on: February 14, 2020   metFORMIN 500 MG tablet Commonly known as: GLUCOPHAGE Take 1 tablet (500 mg total) by mouth 2 (two) times daily with a meal. Resume metformin on 02/09/2020   methocarbamol 500 MG tablet Commonly known as: Robaxin Take 1 tablet (500 mg total) by mouth every 8 (eight) hours as needed for muscle spasms.   metoprolol tartrate 50  MG tablet Commonly known as: LOPRESSOR TAKE 1 AND 1/2 TABLETS BY MOUTH TWICE DAILY   OXYGEN Inhale into the lungs.   Toujeo SoloStar 300 UNIT/ML Solostar Pen Generic drug: insulin glargine (1 Unit Dial) Inject 8 Units into the skin at bedtime. What changed:   how much to take  when to take this  additional instructions   zolpidem 5 MG tablet Commonly known as: AMBIEN Take 1 tablet (5 mg total) by mouth at bedtime as needed for sleep.      No Known Allergies Discharge Instructions    Diet - low sodium heart healthy   Complete by: As directed    Increase activity slowly   Complete by: As  directed       The results of significant diagnostics from this hospitalization (including imaging, microbiology, ancillary and laboratory) are listed below for reference.    Significant Diagnostic Studies: CT Abdomen Pelvis Wo Contrast  Result Date: 02/08/2020 CLINICAL DATA:  Trauma, fall, struck back on wall of bathtub, left abdominal pain and low back pain EXAM: CT ABDOMEN AND PELVIS WITHOUT CONTRAST TECHNIQUE: Multidetector CT imaging of the abdomen and pelvis was performed following the standard protocol without IV contrast. COMPARISON:  Contemporary lumbar spine reconstructions. FINDINGS: Lower chest: Mid subpleural reticular opacities present in both lung bases could reflect some interstitial reticular change or atelectatic features similar to comparison CT 1 day prior. Some mild basilar bronchiectatic features. No consolidation or pleural effusion. Cardiomegaly. Hypoattenuation of the cardiac blood pool may reflect some mild anemia. Three-vessel coronary artery atherosclerosis as well as calcifications upon the aortic leaflets. No pericardial effusion. Distal thoracic aortic atherosclerosis. Hepatobiliary: No direct liver injury or perihepatic hemorrhage is seen. No worrisome focal liver lesions within the limitations of this unenhanced CT. Smooth liver surface contour. Normal liver attenuation. Prior cholecystectomy. No significant biliary dilatation or visible intraductal gallstones. Pancreas: No definite pancreatic contusion or ductal disruption multiple air lucencies about the head of the pancreas correspond to extensive duodenal diverticular disease including several larger air and fluid-filled diverticula present on comparison as remote as 2009. Moderate pancreatic atrophy. No focal peripancreatic inflammation or ductal dilatation. Spleen: No direct splenic injury or perisplenic hematoma. Slightly diminutive size of the spleen with likely vascular calcification. No concerning splenic lesion.  Adrenals/Urinary Tract: No suspicious adrenal lesion or dominant nodule nor evidence of adrenal hemorrhage. Some mild lobular thickening can reflect some senescent adrenal hyperplasia. Mild symmetric bilateral perinephric stranding, a nonspecific finding which may correlate with advanced age or decreased renal function. Few capsular calcifications seen in the inferior pole right kidney may reflect post infectious or inflammatory change. No concerning focal renal lesion. No urolithiasis or hydronephrosis. Slightly increased attenuation of the urinary bladder may reflect recent contrast administration for angiography performed 02/06/2020. Urinary bladder is otherwise unremarkable. Stomach/Bowel: Distal esophagus and stomach are unremarkable. Extensive duodenal diverticulosis with larger air and fluid-filled diverticula adjacent the pancreatic head and uncinate are similar to comparison. Duodenum with a normal sweep across the midline abdomen. No small bowel wall thickening or dilatation. A normal appendix is visualized. No colonic dilatation or wall thickening. Scattered colonic diverticula without focal inflammation to suggest diverticulitis. No evidence of obstruction. No evidence of mesenteric hematoma or contusion. Vascular/Lymphatic: Atherosclerotic calcifications within the abdominal aorta and branch vessels. No aneurysm or ectasia. No enlarged abdominopelvic lymph nodes. Reproductive: Anteverted uterus with parametrial calcification and atrophic appearance of the ovaries is age-appropriate. No concerning uterine or adnexal lesions. Other: No abdominopelvic free air or fluid. No large  body wall hematoma. Subcutaneous nodularity along the low anterior are abdominal wall on the left likely related to injectable use. Posterior body wall edema. No bowel containing hernia or traumatic abdominal wall dehiscence. Musculoskeletal: Dedicated lumbar reconstructions generated and dictated separately, please see report for  further details. In brief, there are displaced left L1 through L4 transverse process fractures. Additionally, there minimally displaced fractures of posterolateral left tenth and eleventh ribs. No other acute traumatic osseous injury of the abdomen or pelvis. Bony pelvis is intact. Femoral heads are normally located. Multilevel discogenic and facet degenerative changes as well as degenerative changes of the SI joints and bilateral hips. No acute abnormality of the partially included right hand. IMPRESSION: 1. Unenhanced CT was performed per clinician order. Lack of IV contrast limits sensitivity and specificity, especially for evaluation of abdominal/pelvic solid viscera. 2. Displaced left L1 through L4 transverse process fractures. For further details of the lumbar spine, please see dedicated reconstructions which were generated from this exam and dictated separately. 3. Minimally displaced fractures of posterolateral left tenth and eleventh ribs. 4. No other acute traumatic osseous injury of the abdomen or pelvis. No solid organ, hollow viscus or vascular injury is evident. 5. Mild symmetric bilateral perinephric stranding, a nonspecific finding which may correlate with advanced age or decreased renal function. If urinary symptoms are present, consider urinalysis to exclude infection. 6. Extensive duodenal diverticulosis with larger air and fluid-filled diverticula adjacent the pancreatic head and uncinate are similar to comparison. Colonic diverticulosis without evidence of diverticulitis. 7. Increased attenuation of the bladder contents likely related to intravenous contrast administration 02/06/2020. 8. Cardiomegaly with coronary artery atherosclerosis. 9. Hypoattenuation of the cardiac blood pool may reflect some mild anemia. 10. Subpleural reticular changes in the lung bases may reflect atelectasis or interstitial fibrosis. 11. Aortic Atherosclerosis (ICD10-I70.0). Electronically Signed   By: Kreg Shropshire  M.D.   On: 02/08/2020 21:57   DG Chest 2 View  Result Date: 02/07/2020 CLINICAL DATA:  Shortness of breath EXAM: CHEST - 2 VIEW COMPARISON:  02/04/2020 FINDINGS: Unchanged appearance of the chest with mild interstitial pulmonary edema and mild cardiomegaly. No focal airspace consolidation. No pleural effusion. IMPRESSION: Unchanged appearance of mild interstitial pulmonary edema. Electronically Signed   By: Deatra Robinson M.D.   On: 02/07/2020 20:12   DG Chest 2 View  Result Date: 02/04/2020 CLINICAL DATA:  Shortness of breath EXAM: CHEST - 2 VIEW COMPARISON:  04/11/2019 FINDINGS: Chronic interstitial prominence. No new consolidation or edema. No pleural effusion or pneumothorax. Stable cardiomediastinal contours with mild cardiomegaly. No acute osseous abnormality. Bilateral reverse shoulder arthroplasties. IMPRESSION: No acute process in the chest. Chronic interstitial changes and stable cardiomegaly. Electronically Signed   By: Guadlupe Spanish M.D.   On: 02/04/2020 11:59   DG Thoracic Spine 2 View  Result Date: 02/08/2020 CLINICAL DATA:  Fall, back pain, known transverse process fractures in the lumbar spine. EXAM: THORACIC SPINE 2 VIEWS COMPARISON:  Same-day CT abdomen and pelvis, CT L-spine. CT angiography of the chest 02/06/2020, radiograph 09/01/2012 FINDINGS: The osseous structures appear diffusely demineralized which may limit detection of small or nondisplaced fractures. Some slight anterior lower thoracic wedging about the T8, T9 levels are compatible with the progressive discogenic and facet degenerative changes seen on recent CT angiography of the chest when compared to more remote comparison radiography. Known left lumbar transverse process fractures and rib fractures are poorly visualized on this exam. Cholecystectomy clips present the right upper quadrant. Some coarsened interstitial changes are present in the  lungs could reflect some mild edema as seen on comparison CT angiography  chest. IMPRESSION: 1. Slight anterior thoracic wedging about the T8, T9 levels are compatible with the progressive discogenic and facet degenerative changes seen on recent CT angiography of the chest. However could correlate for point tenderness and if there is persisting concern, CT or MR imaging could be obtained to assess for interval injury given demineralization limiting detection of subtle fractures. 2. Known left lumbar transverse process fractures and rib fractures are poorly visualized on this exam. Electronically Signed   By: Kreg Shropshire M.D.   On: 02/08/2020 22:37   CT Head Wo Contrast  Result Date: 02/08/2020 CLINICAL DATA:  Fall last night landing on back, no direct head injury. Lower back pain and spasms. On anticoagulation EXAM: CT HEAD WITHOUT CONTRAST CT CERVICAL SPINE WITHOUT CONTRAST TECHNIQUE: Multidetector CT imaging of the head and cervical spine was performed following the standard protocol without intravenous contrast. Multiplanar CT image reconstructions of the cervical spine were also generated. COMPARISON:  CT head 04/18/2004, CT cervical spine 11/17/2015 FINDINGS: CT HEAD FINDINGS Brain: Few punctate scattered hypoattenuating foci in the bilateral basal ganglia may reflect areas of prior lacunar type infarct. Mineralization of the bilateral basal ganglia as well, often a benign senescent finding. No evidence of acute infarction, hemorrhage, hydrocephalus, extra-axial collection, visible mass lesion or mass effect. Symmetric prominence of the ventricles, cisterns and sulci compatible with parenchymal volume loss. Patchy areas of white matter hypoattenuation are most compatible with chronic microvascular angiopathy. Vascular: Atherosclerotic calcification of the carotid siphons and intradural vertebral arteries. No hyperdense vessel. Left dominant vertebrobasilar system. Skull: No significant scalp swelling or hematoma. No calvarial fracture or acute osseous abnormality.  Sinuses/Orbits: Minimal thickening in the ethmoids. Remaining paranasal sinuses and mastoid air cells are predominantly clear. No layering air-fluid levels or pneumatized secretions. Orbital structures are unremarkable aside from prior lens extractions. Other: Moderate bilateral TMJ arthrosis. Edentulous with some mild mandibular prognathism. CT CERVICAL SPINE FINDINGS Alignment: A stabilization collar is absent at the time of examination. There is straightening of the normal cervical lordosis. Slight stepwise retrolisthesis is present C3-C6 and with mild retrolisthesis C6 on C7 and 2 mm anterolisthesis C7 on T1. These findings are similar to comparison radiography and favored to be on a degenerative basis with multilevel spondylitic changes. No evidence of traumatic listhesis. No abnormally widened, perched or jumped facets. Normal alignment of the craniocervical and atlantoaxial articulations. Skull base and vertebrae: No acute skull base fracture. No vertebral body fracture or height loss. The osseous structures appear diffusely demineralized which may limit detection of small or nondisplaced fractures. Moderate arthrosis at the atlantodental and basion dens interval with degenerative spurring. Multilevel cervical spondylitic changes are present as well, better detailed below. Soft tissues and spinal canal: No pre or paravertebral fluid or swelling. No visible canal hematoma. Disc levels: Multilevel intervertebral disc height loss with spondylitic endplate changes. Slightly larger disc osteophyte complexes present at C6-7 which in combination with some posterior ligamentum flavum infolding in on left facet hypertrophic change result in moderate canal stenosis at this level. No other significant canal narrowing. Multilevel uncinate spurring and facet hypertrophic changes elsewhere result in mild-to-moderate multilevel neural foraminal narrowing. Upper chest: No acute abnormality in the upper chest or imaged lung  apices. Other: Atherosclerotic calcifications seen in the proximal great vessels. Cervical carotid atherosclerosis is present as well. Normal thyroid. IMPRESSION: 1. No acute intracranial abnormality. No significant scalp swelling or calvarial fracture. 2. Chronic microvascular ischemic changes,  parenchymal volume loss and intracranial atherosclerosis. 3. No evidence of acute fracture or traumatic listhesis of the cervical spine. 4. Multilevel degenerative changes of the cervical spine, as described above. Features maximal at C6-7 with at most moderate canal stenosis and multilevel mild-to-moderate neural foraminal narrowing. Electronically Signed   By: Kreg Shropshire M.D.   On: 02/08/2020 21:34   CT Chest Wo Contrast  Addendum Date: 02/09/2020   ADDENDUM REPORT: 02/09/2020 04:52 ADDENDUM: There are nondisplaced fractures of the posterior left tenth and eleventh ribs. This was discussed with Dr. Jeraldine Loots at 4:45 a.m. on 02/09/2020. Electronically Signed   By: Deatra Robinson M.D.   On: 02/09/2020 04:52   Result Date: 02/09/2020 CLINICAL DATA:  Fall EXAM: CT THORACIC SPINE WITHOUT CONTRAST; CT CHEST WITHOUT CONTRAST TECHNIQUE: Multidetector CT images of the chest were obtained using the standard protocol without intravenous contrast. Dedicated thoracic spine images were reconstructed from the chest CT data. COMPARISON:  None. FINDINGS: CT CHEST FINDINGS Cardiovascular: There is atherosclerotic calcification of the aorta with coronary artery calcifications as well. Mild cardiomegaly without pericardial effusion. Mediastinum/Nodes: No mediastinal, hilar or axillary lymphadenopathy. Normal visualized thyroid. Thoracic esophageal course is normal. Lungs/Pleura: 3 mm right upper lobe nodule (image 44). Multifocal scarring/atelectasis. No pleural effusion or pulmonary contusion. Upper abdomen: Unremarkable Musculoskeletal: No chest wall abnormality. No bony spinal canal stenosis. CT THORACIC SPINE FINDINGS Alignment:  Normal. Vertebrae: No acute fracture or focal pathologic process. Disc levels: No spinal canal stenosis IMPRESSION: 1. No acute abnormality of the chest or thoracic spine. 2. 3 mm right upper lobe pulmonary nodule. No follow-up needed if patient is low-risk. Non-contrast chest CT can be considered in 12 months if patient is high-risk. This recommendation follows the consensus statement: Guidelines for Management of Incidental Pulmonary Nodules Detected on CT Images: From the Fleischner Society 2017; Radiology 2017; 284:[id number]. Aortic Atherosclerosis (ICD10-I70.0). Electronically Signed: By: Deatra Robinson M.D. On: 02/09/2020 02:16   CT ANGIO CHEST PE W OR WO CONTRAST  Result Date: 02/06/2020 CLINICAL DATA:  Positive D-dimer with shortness of breath EXAM: CT ANGIOGRAPHY CHEST WITH CONTRAST TECHNIQUE: Multidetector CT imaging of the chest was performed using the standard protocol during bolus administration of intravenous contrast. Multiplanar CT image reconstructions and MIPs were obtained to evaluate the vascular anatomy. CONTRAST:  80mL OMNIPAQUE IOHEXOL 350 MG/ML SOLN COMPARISON:  Chest radiograph February 04, 2020; nuclear medicine perfusion lung scan February 05, 2020 FINDINGS: Cardiovascular: There is no demonstrable pulmonary embolus. No thoracic aortic aneurysm. No dissection evident. Note that contrast bolus in the aorta is less than optimal for potential dissection assessment. There are scattered foci of great vessel calcification. There are foci of aortic atherosclerosis as well as multiple foci of coronary artery calcification. There is no pericardial effusion or pericardial thickening. Heart is mildly enlarged. Mediastinum/Nodes: Thyroid appears unremarkable. There are scattered subcentimeter mediastinal lymph nodes. No adenopathy by size criteria evident on this study. There are no appreciable esophageal lesions. Lungs/Pleura: There are areas of scattered bullous disease in the lung bases, more on  the right than on the left. There is mild interstitial edema in the lung bases with areas of atelectasis. Ill-defined airspace opacity in the left upper lobe is felt to represent a combination of atelectasis and superimposed focal pneumonia. On axial slice 47 series 6, there is a 3 mm nodular opacity in the anterior segment of the right upper lobe. On axial slice 54 series 6, there is a 7 x 4 mm nodular opacity in the anterior segment of  the left upper lobe. On axial slice 66 series 6, there is a 3 mm nodular opacity in the anterior segment of the left upper lobe. No appreciable pleural effusions. Upper Abdomen: There is reflux of contrast into the inferior vena cava and hepatic veins. Gallbladder is absent. There are foci of aortic and proximal superior mesenteric artery calcification. Visualized upper abdominal structures otherwise normal. Musculoskeletal: Total shoulder replacements noted bilaterally. Degenerative change noted in the thoracic spine. No blastic or lytic bone lesions are evident. No chest wall lesions. Review of the MIP images confirms the above findings. IMPRESSION: 1. No demonstrable pulmonary embolus. No thoracic aortic aneurysm. No dissection evident. Note that the contrast bolus in the aorta is less than optimal for potential dissection assessment. There is aortic atherosclerosis as well as foci of great vessel and coronary artery calcification. Mild cardiomegaly. 2. Underlying emphysematous change with scattered areas of atelectasis. Mild bibasilar interstitial edema coupled with cardiomegaly may indicate a degree of congestive heart failure. No pleural effusions. Area of patchy airspace opacity in the left upper lobe anteriorly may represent a small focus of pneumonia, with atelectasis. 3. Several small nodular appearing opacities. Largest nodular opacity measures 7 x 4 mm in the anterior segment left upper lobe. Non-contrast chest CT at 6-12 months is recommended. If the nodule is stable at  time of repeat CT, then future CT at 18-24 months (from today's scan) is considered optional for low-risk patients, but is recommended for high-risk patients. This recommendation follows the consensus statement: Guidelines for Management of Incidental Pulmonary Nodules Detected on CT Images: From the Fleischner Society 2017; Radiology 2017; 284:228-243. 4.  No evident adenopathy. 5. Reflux of contrast into the inferior vena cava and hepatic veins may be indicative of a degree of increase in right heart pressure. 6.  Gallbladder absent.  Total shoulder replacements bilaterally. Aortic Atherosclerosis (ICD10-I70.0) and Emphysema (ICD10-J43.9). Electronically Signed   By: Bretta Bang III M.D.   On: 02/06/2020 13:53   CT Cervical Spine Wo Contrast  Result Date: 02/08/2020 CLINICAL DATA:  Fall last night landing on back, no direct head injury. Lower back pain and spasms. On anticoagulation EXAM: CT HEAD WITHOUT CONTRAST CT CERVICAL SPINE WITHOUT CONTRAST TECHNIQUE: Multidetector CT imaging of the head and cervical spine was performed following the standard protocol without intravenous contrast. Multiplanar CT image reconstructions of the cervical spine were also generated. COMPARISON:  CT head 04/18/2004, CT cervical spine 11/17/2015 FINDINGS: CT HEAD FINDINGS Brain: Few punctate scattered hypoattenuating foci in the bilateral basal ganglia may reflect areas of prior lacunar type infarct. Mineralization of the bilateral basal ganglia as well, often a benign senescent finding. No evidence of acute infarction, hemorrhage, hydrocephalus, extra-axial collection, visible mass lesion or mass effect. Symmetric prominence of the ventricles, cisterns and sulci compatible with parenchymal volume loss. Patchy areas of white matter hypoattenuation are most compatible with chronic microvascular angiopathy. Vascular: Atherosclerotic calcification of the carotid siphons and intradural vertebral arteries. No hyperdense  vessel. Left dominant vertebrobasilar system. Skull: No significant scalp swelling or hematoma. No calvarial fracture or acute osseous abnormality. Sinuses/Orbits: Minimal thickening in the ethmoids. Remaining paranasal sinuses and mastoid air cells are predominantly clear. No layering air-fluid levels or pneumatized secretions. Orbital structures are unremarkable aside from prior lens extractions. Other: Moderate bilateral TMJ arthrosis. Edentulous with some mild mandibular prognathism. CT CERVICAL SPINE FINDINGS Alignment: A stabilization collar is absent at the time of examination. There is straightening of the normal cervical lordosis. Slight stepwise retrolisthesis is present C3-C6 and  with mild retrolisthesis C6 on C7 and 2 mm anterolisthesis C7 on T1. These findings are similar to comparison radiography and favored to be on a degenerative basis with multilevel spondylitic changes. No evidence of traumatic listhesis. No abnormally widened, perched or jumped facets. Normal alignment of the craniocervical and atlantoaxial articulations. Skull base and vertebrae: No acute skull base fracture. No vertebral body fracture or height loss. The osseous structures appear diffusely demineralized which may limit detection of small or nondisplaced fractures. Moderate arthrosis at the atlantodental and basion dens interval with degenerative spurring. Multilevel cervical spondylitic changes are present as well, better detailed below. Soft tissues and spinal canal: No pre or paravertebral fluid or swelling. No visible canal hematoma. Disc levels: Multilevel intervertebral disc height loss with spondylitic endplate changes. Slightly larger disc osteophyte complexes present at C6-7 which in combination with some posterior ligamentum flavum infolding in on left facet hypertrophic change result in moderate canal stenosis at this level. No other significant canal narrowing. Multilevel uncinate spurring and facet hypertrophic  changes elsewhere result in mild-to-moderate multilevel neural foraminal narrowing. Upper chest: No acute abnormality in the upper chest or imaged lung apices. Other: Atherosclerotic calcifications seen in the proximal great vessels. Cervical carotid atherosclerosis is present as well. Normal thyroid. IMPRESSION: 1. No acute intracranial abnormality. No significant scalp swelling or calvarial fracture. 2. Chronic microvascular ischemic changes, parenchymal volume loss and intracranial atherosclerosis. 3. No evidence of acute fracture or traumatic listhesis of the cervical spine. 4. Multilevel degenerative changes of the cervical spine, as described above. Features maximal at C6-7 with at most moderate canal stenosis and multilevel mild-to-moderate neural foraminal narrowing. Electronically Signed   By: Kreg Shropshire M.D.   On: 02/08/2020 21:34   NM Pulmonary Perfusion  Result Date: 02/05/2020 CLINICAL DATA:  PE suspected EXAM: NUCLEAR MEDICINE PERFUSION LUNG SCAN TECHNIQUE: Perfusion images were obtained in multiple projections after intravenous injection of radiopharmaceutical. Ventilation scans intentionally deferred if perfusion scan and chest x-ray adequate for interpretation during COVID 19 epidemic. RADIOPHARMACEUTICALS:  4.2 mCi Tc-55m MAA IV COMPARISON:  Chest radiograph, 02/04/2020 FINDINGS: Normal, homogeneous perfusion of the lungs. No suspicious filling defects. IMPRESSION: Very low probability examination for pulmonary embolism by modified perfusion only PIOPED criteria (PE absent). Electronically Signed   By: Lauralyn Primes M.D.   On: 02/05/2020 09:59   CT T-SPINE NO CHARGE  Addendum Date: 02/09/2020   ADDENDUM REPORT: 02/09/2020 04:52 ADDENDUM: There are nondisplaced fractures of the posterior left tenth and eleventh ribs. This was discussed with Dr. Jeraldine Loots at 4:45 a.m. on 02/09/2020. Electronically Signed   By: Deatra Robinson M.D.   On: 02/09/2020 04:52   Result Date: 02/09/2020 CLINICAL  DATA:  Fall EXAM: CT THORACIC SPINE WITHOUT CONTRAST; CT CHEST WITHOUT CONTRAST TECHNIQUE: Multidetector CT images of the chest were obtained using the standard protocol without intravenous contrast. Dedicated thoracic spine images were reconstructed from the chest CT data. COMPARISON:  None. FINDINGS: CT CHEST FINDINGS Cardiovascular: There is atherosclerotic calcification of the aorta with coronary artery calcifications as well. Mild cardiomegaly without pericardial effusion. Mediastinum/Nodes: No mediastinal, hilar or axillary lymphadenopathy. Normal visualized thyroid. Thoracic esophageal course is normal. Lungs/Pleura: 3 mm right upper lobe nodule (image 44). Multifocal scarring/atelectasis. No pleural effusion or pulmonary contusion. Upper abdomen: Unremarkable Musculoskeletal: No chest wall abnormality. No bony spinal canal stenosis. CT THORACIC SPINE FINDINGS Alignment: Normal. Vertebrae: No acute fracture or focal pathologic process. Disc levels: No spinal canal stenosis IMPRESSION: 1. No acute abnormality of the chest or thoracic spine.  2. 3 mm right upper lobe pulmonary nodule. No follow-up needed if patient is low-risk. Non-contrast chest CT can be considered in 12 months if patient is high-risk. This recommendation follows the consensus statement: Guidelines for Management of Incidental Pulmonary Nodules Detected on CT Images: From the Fleischner Society 2017; Radiology 2017; 284:228-243. Aortic Atherosclerosis (ICD10-I70.0). Electronically Signed: By: Deatra Robinson M.D. On: 02/09/2020 02:16   CT L-SPINE NO CHARGE  Result Date: 02/08/2020 CLINICAL DATA:  Fall, struck back with low back pain EXAM: CT LUMBAR SPINE WITHOUT CONTRAST TECHNIQUE: Multiplanar reconstructions of the lumbar spine were generated from the contemporary CT of the abdomen and pelvis. COMPARISON:  Contemporary CT abdomen and pelvis. FINDINGS: Segmentation: 5 normally formed lumbar type vertebral bodies. Lowest fully formed disc  space denoted as L5-S1. Alignment: Mild straightening of normal lumbar lordosis. Minimal retrolisthesis of L4 on L5 of approximately 3 mm. Favored to be on a facet degenerative basis without spondylolysis. No abnormally widened, jumped or perched facets. Vertebrae: No acute vertebral body fracture or height loss is seen. There are displaced fractures of the left L1 through L4 transverse processes. No other acute fracture or traumatic osseous injury is seen in the lumbar spine or included portions of the bony pelvis. 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 Multilevel discogenic and facet degenerative changes are present, detailed below level by level. 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 Additional degenerative changes in the bilateral SI joints and hips. Paraspinal and other soft tissues: Minimal soft tissue thickening seen adjacent the left L1-L4 transverse process fractures. No other acute paravertebral fluid, swelling, gas or hemorrhage. No visible canal hematoma within the limitations of this exam. Posterior body wall edema. For findings in the posterior abdomen and pelvis, please see dedicated CT from which this study is reconstructed. Disc levels: Level by level evaluation of the lumbar spine below: T11-T12: Disc desiccation and height loss with small global disc bulge and moderate facet arthropathy, left greater than right with severe left and moderate right neural foraminal narrowing. T12-L1: Mild disc height loss and bilateral facet arthropathy. Shallow global disc bulge. No significant spinal canal or foraminal stenosis. L1-L2: Moderate to severe disc height loss with desiccation and Schmorl's node formations and mild bilateral facet arthropathy. No significant canal stenosis. Mild left and moderate right foraminal narrowing. L2-L3: Severe disc height loss, desiccation and vacuum disc  with moderate bilateral facet arthropathy, posterior ridging and global disc bulge. Moderate canal stenosis and mild bilateral foraminal narrowing. L3-L4: Mild disc height loss with desiccation, Schmorl's node formation and vacuum disc phenomenon. Mild bilateral facet arthropathy. Shallow global disc bulge and ligamentum flavum infolding with at most mild canal stenosis but no significant neural foraminal narrowing. L4-L5: Retrolisthesis, mild disc height loss and moderate bilateral facet arthropathy, with shallow global disc bulge and some mild ligamentum flavum infolding at most mild canal stenosis but with moderate to severe bilateral foraminal narrowing. L5-S1: Severe disc height loss, desiccation and vacuum disc as well as severe bilateral facet arthropathy. Shallow global disc bulge. No significant canal stenosis. Moderate to severe bilateral neural foraminal narrowing. IMPRESSION: 1. Displaced fractures of the left L1 through L4 transverse processes. 2. No other acute fracture or traumatic osseous injury is seen in the lumbar spine or included portions of the bony pelvis. 3. Diffuse bony demineralization may limit detection of subtle nondisplaced fractures. 4. Multilevel discogenic and facet degenerative changes as described above, with moderate canal stenosis  at L2-L3 and mild canal stenosis at L3-L4 and L4-L5. Multilevel moderate to severe neural foraminal narrowing is present as well. 5. For findings in the posterior abdomen and pelvis, please see dedicated CT from which this study is reconstructed. Electronically Signed   By: Kreg Shropshire M.D.   On: 02/08/2020 21:44   ECHOCARDIOGRAM COMPLETE  Result Date: 02/05/2020    ECHOCARDIOGRAM REPORT   Patient Name:   LAUREY SALSER Date of Exam: 02/05/2020 Medical Rec #:  [id number]       Height:       64.0 in Accession #:    [id number]      Weight:       158.0 lb Date of Birth:  05/18/1927       BSA:          1.770 m Patient Age:    89 years        BP:            124/59 mmHg Patient Gender: F               HR:           62 bpm. Exam Location:  Inpatient Procedure: 2D Echo, Color Doppler and Cardiac Doppler Indications:    CHF-Acute Diastolic 428.31 / I50.31  History:        Patient has prior history of Echocardiogram examinations, most                 recent 06/06/2019. CHF, Signs/Symptoms:Shortness of Breath; Risk                 Factors:Hypertension, Dyslipidemia and Diabetes.  Sonographer:    Eulah Pont RDCS Referring Phys: Lang Snow Daylene Katayama [facility] IMPRESSIONS  1. Left ventricular ejection fraction, by estimation, is 60 to 65%. The left ventricle has normal function. The left ventricle has no regional wall motion abnormalities. There is moderate left ventricular hypertrophy. Left ventricular diastolic parameters are consistent with Grade I diastolic dysfunction (impaired relaxation).  2. Right ventricular systolic function is normal. The right ventricular size is normal. There is moderately elevated pulmonary artery systolic pressure at 48 mmHg.  3. Left atrial size was mildly dilated.  4. Tricuspid valve regurgitation is moderate.  5. Compared to previous outpatient study on 06/05/2019, pulmonary hypertension is new finding. FINDINGS  Left Ventricle: Left ventricular ejection fraction, by estimation, is 60 to 65%. The left ventricle has normal function. The left ventricle has no regional wall motion abnormalities. The left ventricular internal cavity size was normal in size. There is  moderate left ventricular hypertrophy. Left ventricular diastolic parameters are consistent with Grade I diastolic dysfunction (impaired relaxation). Right Ventricle: The right ventricular size is normal. No increase in right ventricular wall thickness. Right ventricular systolic function is normal. There is moderately elevated pulmonary artery systolic pressure. The tricuspid regurgitant velocity is 3.34 m/s, and with an assumed right atrial pressure of 3 mmHg, the estimated right  ventricular systolic pressure is 47.6 mmHg. Left Atrium: Left atrial size was mildly dilated. Right Atrium: Right atrial size was normal in size. Pericardium: There is no evidence of pericardial effusion. Mitral Valve: The mitral valve is grossly normal. No evidence of mitral valve regurgitation. Tricuspid Valve: The tricuspid valve is grossly normal. Tricuspid valve regurgitation is moderate. Aortic Valve: The aortic valve is grossly normal. Aortic valve regurgitation is not visualized. Pulmonic Valve: The pulmonic valve was grossly normal. Pulmonic valve regurgitation is not visualized. Aorta: Upper limit normal at 3.5 cm. Venous:  The inferior vena cava is normal in size with greater than 50% respiratory variability, suggesting right atrial pressure of 3 mmHg. IAS/Shunts: The interatrial septum is aneurysmal. No atrial level shunt detected by color flow Doppler.  LEFT VENTRICLE PLAX 2D LVIDd:         4.00 cm  Diastology LVIDs:         2.60 cm  LV e' medial:    3.24 cm/s LV PW:         1.40 cm  LV E/e' medial:  17.7 LV IVS:        1.00 cm  LV e' lateral:   3.07 cm/s LVOT diam:     2.00 cm  LV E/e' lateral: 18.7 LV SV:         85 LV SV Index:   48 LVOT Area:     3.14 cm  RIGHT VENTRICLE RV S prime:     11.30 cm/s TAPSE (M-mode): 1.8 cm LEFT ATRIUM             Index       RIGHT ATRIUM           Index LA diam:        3.00 cm 1.70 cm/m  RA Area:     10.60 cm LA Vol (A2C):   48.0 ml 27.12 ml/m RA Volume:   16.10 ml  9.10 ml/m LA Vol (A4C):   62.5 ml 35.31 ml/m LA Biplane Vol: 58.4 ml 33.00 ml/m  AORTIC VALVE LVOT Vmax:   117.00 cm/s LVOT Vmean:  86.300 cm/s LVOT VTI:    0.270 m  AORTA Ao Root diam: 3.10 cm Ao Asc diam:  3.50 cm MITRAL VALVE                TRICUSPID VALVE MV Area (PHT): 1.92 cm     TR Peak grad:   44.6 mmHg MV Decel Time: 396 msec     TR Vmax:        334.00 cm/s MV E velocity: 57.40 cm/s MV A velocity: 105.00 cm/s  SHUNTS MV E/A ratio:  0.55         Systemic VTI:  0.27 m                              Systemic Diam: 2.00 cm Truett Mainland MD Electronically signed by Truett Mainland MD Signature Date/Time: 02/05/2020/2:24:48 PM    Final    VAS [place] LOWER EXTREMITY VENOUS (DVT)  Result Date: 02/05/2020  Lower Venous DVTStudy Indications: Asymmetric edema.  Anticoagulation: Lovenox. Comparison Study: No prior studies. Performing Technologist: Maral Rosenthal  Examination Guidelines: A complete evaluation includes B-mode imaging, spectral Doppler, color Doppler, and power Doppler as needed of all accessible portions of each vessel. Bilateral testing is considered an integral part of a complete examination. Limited examinations for reoccurring indications may be performed as noted. The reflux portion of the exam is performed with the patient in reverse Trendelenburg.  +---------+---------------+---------+-----------+----------+--------------+ RIGHT    CompressibilityPhasicitySpontaneityPropertiesThrombus Aging +---------+---------------+---------+-----------+----------+--------------+ CFV      Full           Yes      Yes                                 +---------+---------------+---------+-----------+----------+--------------+ SFJ      Full                                                        +---------+---------------+---------+-----------+----------+--------------+  FV Prox  Full                                                        +---------+---------------+---------+-----------+----------+--------------+ FV Mid   Full                                                        +---------+---------------+---------+-----------+----------+--------------+ FV DistalFull                                                        +---------+---------------+---------+-----------+----------+--------------+ PFV      Full                                                        +---------+---------------+---------+-----------+----------+--------------+ POP      Full            Yes      Yes                                 +---------+---------------+---------+-----------+----------+--------------+ PTV      Full                                                        +---------+---------------+---------+-----------+----------+--------------+ PERO     Full                                                        +---------+---------------+---------+-----------+----------+--------------+   +---------+---------------+---------+-----------+----------+--------------+ LEFT     CompressibilityPhasicitySpontaneityPropertiesThrombus Aging +---------+---------------+---------+-----------+----------+--------------+ CFV      Full           Yes      Yes                                 +---------+---------------+---------+-----------+----------+--------------+ SFJ      Full                                                        +---------+---------------+---------+-----------+----------+--------------+ FV Prox  Full                                                        +---------+---------------+---------+-----------+----------+--------------+   FV Mid   Full                                                        +---------+---------------+---------+-----------+----------+--------------+ FV DistalFull                                                        +---------+---------------+---------+-----------+----------+--------------+ PFV      Full                                                        +---------+---------------+---------+-----------+----------+--------------+ POP      Full           Yes      Yes                                 +---------+---------------+---------+-----------+----------+--------------+ PTV      Full                                                        +---------+---------------+---------+-----------+----------+--------------+ PERO     Full                                                         +---------+---------------+---------+-----------+----------+--------------+     Summary: RIGHT: - There is no evidence of deep vein thrombosis in the lower extremity.  - No cystic structure found in the popliteal fossa.  LEFT: - There is no evidence of deep vein thrombosis in the lower extremity.  - No cystic structure found in the popliteal fossa.  *See table(s) above for measurements and observations. Electronically signed by Waverly Ferrarihristopher Dickson MD on 02/05/2020 at 5:38:30 PM.    Final     Microbiology: Recent Results (from the past 240 hour(s))  Respiratory Panel by RT PCR (Flu A&B, Covid) - Nasopharyngeal Swab     Status: None   Collection Time: 02/04/20 12:58 PM   Specimen: Nasopharyngeal Swab  Result Value Ref Range Status   SARS Coronavirus 2 by RT PCR NEGATIVE NEGATIVE Final    Comment: (NOTE) SARS-CoV-2 target nucleic acids are NOT DETECTED.  The SARS-CoV-2 RNA is generally detectable in upper respiratoy specimens during the acute phase of infection. The lowest concentration of SARS-CoV-2 viral copies this assay can detect is 131 copies/mL. A negative result does not preclude SARS-Cov-2 infection and should not be used as the sole basis for treatment or other patient management decisions. A negative result may occur with  improper specimen collection/handling, submission of specimen other than nasopharyngeal swab, presence of viral mutation(s) within the areas targeted by this assay, and inadequate number of  viral copies (<131 copies/mL). A negative result must be combined with clinical observations, patient history, and epidemiological information. The expected result is Negative.  Fact Sheet for Patients:  https://www.moore.com/  Fact Sheet for Healthcare Providers:  https://www.young.biz/  This test is no t yet approved or cleared by the [place] FDA and  has been authorized for detection and/or diagnosis of SARS-CoV-2 by FDA  under an Emergency Use Authorization (EUA). This EUA will remain  in effect (meaning this test can be used) for the duration of the COVID-19 declaration under Section 564(b)(1) of the Act, 21 U.S.C. section 360bbb-3(b)(1), unless the authorization is terminated or revoked sooner.     Influenza A by PCR NEGATIVE NEGATIVE Final   Influenza B by PCR NEGATIVE NEGATIVE Final    Comment: (NOTE) The Xpert Xpress SARS-CoV-2/FLU/RSV assay is intended as an aid in  the diagnosis of influenza from Nasopharyngeal swab specimens and  should not be used as a sole basis for treatment. Nasal washings and  aspirates are unacceptable for Xpert Xpress SARS-CoV-2/FLU/RSV  testing.  Fact Sheet for Patients: https://www.moore.com/  Fact Sheet for Healthcare Providers: https://www.young.biz/  This test is not yet approved or cleared by the [place] FDA and  has been authorized for detection and/or diagnosis of SARS-CoV-2 by  FDA under an Emergency Use Authorization (EUA). This EUA will remain  in effect (meaning this test can be used) for the duration of the  Covid-19 declaration under Section 564(b)(1) of the Act, 21  U.S.C. section 360bbb-3(b)(1), unless the authorization is  terminated or revoked. Performed at [facility], [address] [place], [place] [address]   Urine culture     Status: Abnormal   Collection Time: 02/04/20  8:12 PM   Specimen: Urine, Random  Result Value Ref Range Status   Specimen Description URINE, RANDOM  Final   Special Requests   Final    NONE Performed at [facility], [address], [place], [place] [id number]    Culture MULTIPLE SPECIES PRESENT, SUGGEST RECOLLECTION (A)  Final   Report Status 02/06/2020 FINAL  Final  Respiratory Panel by RT PCR (Flu A&B, Covid) - Nasopharyngeal Swab     Status: None   Collection Time: 02/08/20 11:19 PM   Specimen: Nasopharyngeal Swab  Result Value Ref Range Status   SARS  Coronavirus 2 by RT PCR NEGATIVE NEGATIVE Final    Comment: (NOTE) SARS-CoV-2 target nucleic acids are NOT DETECTED.  The SARS-CoV-2 RNA is generally detectable in upper respiratoy specimens during the acute phase of infection. The lowest concentration of SARS-CoV-2 viral copies this assay can detect is 131 copies/mL. A negative result does not preclude SARS-Cov-2 infection and should not be used as the sole basis for treatment or other patient management decisions. A negative result may occur with  improper specimen collection/handling, submission of specimen other than nasopharyngeal swab, presence of viral mutation(s) within the areas targeted by this assay, and inadequate number of viral copies (<131 copies/mL). A negative result must be combined with clinical observations, patient history, and epidemiological information. The expected result is Negative.  Fact Sheet for Patients:  https://www.moore.com/  Fact Sheet for Healthcare Providers:  https://www.young.biz/  This test is no t yet approved or cleared by the [place] FDA and  has been authorized for detection and/or diagnosis of SARS-CoV-2 by FDA under an Emergency Use Authorization (EUA). This EUA will remain  in effect (meaning this test can be used) for the duration of the COVID-19  declaration under Section 564(b)(1) of the Act, 21 U.S.C. section 360bbb-3(b)(1), unless the authorization is terminated or revoked sooner.     Influenza A by PCR NEGATIVE NEGATIVE Final   Influenza B by PCR NEGATIVE NEGATIVE Final    Comment: (NOTE) The Xpert Xpress SARS-CoV-2/FLU/RSV assay is intended as an aid in  the diagnosis of influenza from Nasopharyngeal swab specimens and  should not be used as a sole basis for treatment. Nasal washings and  aspirates are unacceptable for Xpert Xpress SARS-CoV-2/FLU/RSV  testing.  Fact Sheet for  Patients: https://www.moore.com/  Fact Sheet for Healthcare Providers: https://www.young.biz/  This test is not yet approved or cleared by the [place] FDA and  has been authorized for detection and/or diagnosis of SARS-CoV-2 by  FDA under an Emergency Use Authorization (EUA). This EUA will remain  in effect (meaning this test can be used) for the duration of the  Covid-19 declaration under Section 564(b)(1) of the Act, 21  U.S.C. section 360bbb-3(b)(1), unless the authorization is  terminated or revoked. Performed at [facility], [address], [place], [place] [address]   SARS Coronavirus 2 by RT PCR (hospital order, performed in [facility] hospital lab) Nasopharyngeal Nasopharyngeal Swab     Status: None   Collection Time: 02/13/20 11:16 AM   Specimen: Nasopharyngeal Swab  Result Value Ref Range Status   SARS Coronavirus 2 NEGATIVE NEGATIVE Final    Comment: (NOTE) SARS-CoV-2 target nucleic acids are NOT DETECTED.  The SARS-CoV-2 RNA is generally detectable in upper and lower respiratory specimens during the acute phase of infection. The lowest concentration of SARS-CoV-2 viral copies this assay can detect is 250 copies / mL. A negative result does not preclude SARS-CoV-2 infection and should not be used as the sole basis for treatment or other patient management decisions.  A negative result may occur with improper specimen collection / handling, submission of specimen other than nasopharyngeal swab, presence of viral mutation(s) within the areas targeted by this assay, and inadequate number of viral copies (<250 copies / mL). A negative result must be combined with clinical observations, patient history, and epidemiological information.  Fact Sheet for Patients:   BoilerBrush.com.cy  Fact Sheet for Healthcare Providers: https://pope.com/  This test is not yet approved or   cleared by the [place] FDA and has been authorized for detection and/or diagnosis of SARS-CoV-2 by FDA under an Emergency Use Authorization (EUA).  This EUA will remain in effect (meaning this test can be used) for the duration of the COVID-19 declaration under Section 564(b)(1) of the Act, 21 U.S.C. section 360bbb-3(b)(1), unless the authorization is terminated or revoked sooner.  Performed at [facility], [address], [place], [place] [address]      Labs: CBC: Recent Labs  Lab 02/08/20 2218 02/09/20 0858 02/10/20 1528 02/11/20 0333  WBC 11.4* 9.3  --  9.1  NEUTROABS 4.8  --   --   --   HGB 10.6* 10.7* 10.3* 10.5*  HCT 34.0* 34.0* 32.7* 33.7*  MCV 81.3 80.4  --  81.6  PLT 310 293  --  320   Basic Metabolic Panel: Recent Labs  Lab 02/09/20 0858 02/10/20 1150 02/10/20 1528 02/11/20 0333 02/12/20 0231 02/13/20 0202  NA 138 140  --  140 139 141  K 3.8 3.6  --  4.5 3.5 4.5  CL 107 108  --  108 110 112*  CO2 18* 22  --  21* 18* 19*  GLUCOSE 134* 141*  --  107* 107* 123*  BUN 19 26*  --  27* 30* 22  CREATININE 1.41* 1.70*  --  1.76* 1.80* 1.48*  CALCIUM 9.0 9.2  --  9.1 8.9 8.8*  MG 1.9  --  2.1 2.2 2.1 2.2  PHOS 3.4 4.4  --  4.5 3.5 3.2   Liver Function Tests: Recent Labs  Lab 02/08/20 2218 02/08/20 2218 02/09/20 0858 02/10/20 1150 02/11/20 0333 02/12/20 0231 02/13/20 0202  AST 28  --   --   --   --   --   --   ALT 21  --   --   --   --   --   --   ALKPHOS 81  --   --   --   --   --   --   BILITOT 1.1  --   --   --   --   --   --   PROT 7.0  --   --   --   --   --   --   ALBUMIN 3.6   < > 3.4* 3.2* 3.2* 2.9* 2.9*   < > = values in this interval not displayed.   CBG: Recent Labs  Lab 02/12/20 1127 02/12/20 1751 02/12/20 2022 02/13/20 0656 02/13/20 1148  GLUCAP 166* 112* 153* 174* 98    Time spent: 35 minutes  Signed:  Lynden Oxford  [facility]  02/13/2020 2:02 PM

## 2020-02-13 NOTE — Social Work (Signed)
Pts insurance Berkley Harvey has been approved.  Pt has been approved11/3-11/5 Case manager:Christine Pam Drown, [facility] Clinical Social Worker [phone]

## 2020-02-13 NOTE — TOC Transition Note (Signed)
Transition of Care [facility]) - CM/SW Discharge Note   Patient Details  Name: Jenna Murphy MRN: [id number] Date of Birth: 15-Mar-1928  [facility] CM/SW Contact:  Jimmy Picket, LCSWA Phone Number: 02/13/2020, 2:19 PM   Clinical Narrative:     Pt will discharge to [facility] via ptar. Pts son Carmelia Roller has been notified. Pt is negative for covid.   Nurse to call report to [phone].  Final next level of care: Skilled Nursing Facility Barriers to Discharge: Barriers Resolved   Patient Goals and CMS Choice Patient states their goals for this hospitalization and ongoing recovery are:: to get better CMS Medicare.gov Compare Post Acute Care list provided to:: Patient Choice offered to / list presented to : Adult Children  Discharge Placement              Patient chooses bed at: [facility] Patient to be transferred to facility by: Ptar Name of family member notified: Son, Dwayne Patient and family notified of of transfer: 02/13/20  Discharge Plan and Services                                     Social Determinants of Health (SDOH) Interventions     Readmission Risk Interventions No flowsheet data found.   Jimmy Picket, Theresia Majors, [place] Clinical Social Worker [phone]

## 2020-02-18 ENCOUNTER — Telehealth: Payer: Self-pay

## 2020-02-18 ENCOUNTER — Ambulatory Visit: Payer: Medicare Other | Admitting: Cardiology

## 2020-02-18 ENCOUNTER — Other Ambulatory Visit: Payer: Self-pay

## 2020-02-18 MED ORDER — ISOSORB DINITRATE-HYDRALAZINE 20-37.5 MG PO TABS: 1.0000 | ORAL_TABLET | Freq: Three times a day (TID) | ORAL | 3 refills | Status: AC

## 2020-02-18 NOTE — Telephone Encounter (Signed)
Called pt son to inform him that we can try options #1 for now. Pt understood

## 2020-02-18 NOTE — Telephone Encounter (Signed)
Pt son called to inform us that pt BIDIL is too expensive and would like to know what would the next step be. Please advise.

## 2020-02-18 NOTE — Telephone Encounter (Signed)
Option#1: Give samples and sent prescription to [facility] Option#2: Please send prescription for Isordil 20 mg tid (90 pillsX2 refills) and hydralazine 25 mg tid (90 pillsX2 refills) instead of bidil.  Thanks MJP

## 2020-03-13 ENCOUNTER — Encounter: Payer: Self-pay | Admitting: Cardiology

## 2020-03-13 ENCOUNTER — Ambulatory Visit: Payer: Medicare Other | Admitting: Cardiology

## 2020-03-13 ENCOUNTER — Ambulatory Visit: Payer: Medicare Other

## 2020-03-13 ENCOUNTER — Other Ambulatory Visit: Payer: Self-pay

## 2020-03-13 VITALS — BP 149/65 | HR 90 | Ht 64.0 in | Wt 155.0 lb

## 2020-03-13 DIAGNOSIS — R55 Syncope and collapse: Secondary | ICD-10-CM

## 2020-03-13 DIAGNOSIS — I10 Essential (primary) hypertension: Secondary | ICD-10-CM

## 2020-03-13 DIAGNOSIS — I459 Conduction disorder, unspecified: Secondary | ICD-10-CM

## 2020-03-13 DIAGNOSIS — I739 Peripheral vascular disease, unspecified: Secondary | ICD-10-CM

## 2020-03-13 DIAGNOSIS — I5032 Chronic diastolic (congestive) heart failure: Secondary | ICD-10-CM

## 2020-03-13 MED ORDER — METOPROLOL TARTRATE 50 MG PO TABS: 50.0000 mg | ORAL_TABLET | Freq: Two times a day (BID) | ORAL | 0 refills | Status: DC

## 2020-03-13 NOTE — Progress Notes (Signed)
Subjective:   Jenna Murphy, female    DOB: 30-Dec-1927, 86 y.o.   MRN: [id number]   Chief complaint:  PAD  HPI   86 y/o [place] American female with hypertension, type 2 DM, HFpEF  Patient was hospitalized in 01/2020 with acute on chronic respiratory failure, likely due to acute on chronic HFpEF. I had recommended toresemide 20 mg qod and bidil 20-37.5 mg tid o discharge. She could not afford BIdil.  Patient is here with her daughter and caretaker. She had one episode of what her daughter called as "sleep paralysis" at around 4 AM. However, on further history, it appears that she was found to be hypoglycemic at 44 and felt better with drinking juice. She is going to discuss with her PCP re: adjusting dose of her metformin and Toujeo.   On a separate note. Daughter reports episodes where the patient is sitting and suddenly drops her head for a few seconds and seems to lose consciousness only for few seconds, and regains consciousness in a few seconds.  Overall, breathing is well. She only gets short of breath when she does more than usual activity.  Current Outpatient Medications on File Prior to Visit  Medication Sig Dispense Refill  . atorvastatin (LIPITOR) 20 MG tablet Take 20 mg by mouth daily.    . cholecalciferol (VITAMIN D3) 25 MCG (1000 UNIT) tablet Take 1,000 Units by mouth daily.    . isosorbide-hydrALAZINE (BIDIL) 20-37.5 MG tablet Take 1 tablet by mouth 3 (three) times daily. 90 tablet 3  . lidocaine (LIDODERM) 5 % Place 1 patch onto the skin daily. Remove & Discard patch within 12 hours or as directed by MD 30 patch 0  . metFORMIN (GLUCOPHAGE) 500 MG tablet Take 1 tablet (500 mg total) by mouth 2 (two) times daily with a meal. Resume metformin on 02/09/2020    . methocarbamol (ROBAXIN) 500 MG tablet Take 1 tablet (500 mg total) by mouth every 8 (eight) hours as needed for muscle spasms. 15 tablet 0  . metoprolol tartrate (LOPRESSOR) 50 MG tablet TAKE 1 AND 1/2 TABLETS  BY MOUTH TWICE DAILY (Patient taking differently: TAKE 1 AND 1/2 TABLETS BY MOUTH TWICE DAILY) 180 tablet 2  . OXYGEN Inhale into the lungs.    Nelva Nay SOLOSTAR 300 UNIT/ML Solostar Pen Inject 8 Units into the skin at bedtime. 10 mL 0  . zolpidem (AMBIEN) 5 MG tablet Take 1 tablet (5 mg total) by mouth at bedtime as needed for sleep. 5 tablet 0   No current facility-administered medications on file prior to visit.     Cardiovascular studies:  Echocardiogram 02/05/2020: 1. Left ventricular ejection fraction, by estimation, is 60 to 65%. The  left ventricle has normal function. The left ventricle has no regional  wall motion abnormalities. There is moderate left ventricular hypertrophy.  Left ventricular diastolic  parameters are consistent with Grade I diastolic dysfunction (impaired  relaxation).  2. Right ventricular systolic function is normal. The right ventricular  size is normal. There is moderately elevated pulmonary artery systolic  pressure at 48 mmHg.  3. Left atrial size was mildly dilated.  4. Tricuspid valve regurgitation is moderate.  5. Compared to previous outpatient study on 06/05/2019, pulmonary  hypertension is new finding.   ABI 01/19/2018: This exam reveals mildly decreased perfusion of the right lower extremity, noted at the dorsalis pedis artery level (Rt ABI 0.82) and moderately decreased perfusion of the left lower extremity, noted at the dorsalis pedis artery  level (Lt ABI 0.71).  Lexiscan myoview stress test 12/19/2017: 1. The resting electrocardiogram demonstrated normal sinus rhythm, LAD, LAFB, RBBB.  Stress EKG is non-diagnostic for ischemia as it a pharmacologic stress using Lexiscan. Stress symptoms included abdominal discomfort and headache. 2. Myocardial perfusion imaging is normal without ischemia or scar. Overall left ventricular systolic function was visually normal without regional wall motion abnormalities, however calculated at 34%.  3. This  is an intermediate risk study, clinical correlation recommended.  Recent labs: 02/13/2020: Glucose 123, BUN/Cr 22/1.48. EGFR 33. Na/K 141/4.5. Albumin 2.9 H/H 10.5/33.7. MCV 81. Platelets 320  12/2015: HbA1C 8.2%  02/2008: Chol 116, TG 43, HDL 51, LDL 56   Review of Systems  Cardiovascular: Positive for syncope. Negative for chest pain, dyspnea on exertion, leg swelling and palpitations.        Vitals:   03/13/20 1621  BP: (!) 149/65  Pulse: 90  SpO2: 95%     Physical Exam: Physical Exam Vitals and nursing note reviewed.  Constitutional:      General: She is not in acute distress. Neck:     Vascular: No JVD.  Cardiovascular:     Rate and Rhythm: Normal rate and regular rhythm.     Heart sounds: Normal heart sounds. No murmur heard.   Pulmonary:     Effort: Pulmonary effort is normal.     Breath sounds: Normal breath sounds. No wheezing or rales.  Musculoskeletal:     Right lower leg: Edema (Trace) present.     Left lower leg: Edema (Trace) present.            Assessment & Recommendations:  86 y/o [place] American female with hypertension, type 2 DM, HFpEF  Syncope: Episodes concerning for conduction abnormality, leading to brief syncope. AS a result, I have reduce her metoprolol to 25 mg bid, with eventual plans to stop it after weaning off. Will check cardiac telemetry.   Hypertension: Reasonably well controlled.  HFpEF: Recommend torsemide 20 mg every other day  F/u in 4 weeks  Hilari Wethington Esther Hardy, MD [facility] Pager: [phone] Office: [phone] If no answer Cell [phone]

## 2020-03-15 ENCOUNTER — Encounter: Payer: Self-pay | Admitting: Cardiology

## 2020-03-15 DIAGNOSIS — I459 Conduction disorder, unspecified: Secondary | ICD-10-CM | POA: Insufficient documentation

## 2020-03-20 ENCOUNTER — Other Ambulatory Visit: Payer: Self-pay

## 2020-04-16 DIAGNOSIS — I471 Supraventricular tachycardia: Secondary | ICD-10-CM | POA: Insufficient documentation

## 2020-04-16 NOTE — Progress Notes (Unsigned)
Subjective:   Jenna Murphy, female    DOB: 1927-07-04, 87 y.o.   MRN: [id number]   Chief complaint:  PAD  HPI   87 y/o [place] American female with hypertension, type 2 DM, HFpEF, PSVT  Patient is here today with her caretaker.  She is doing well, denies any syncopal episodes. She reports short lasting episodes of palpitations, which do correlated with PSVT on monitor. She has mild swelling at her ankles.    Current Outpatient Medications on File Prior to Visit  Medication Sig Dispense Refill  . aspirin 81 MG EC tablet Take 81 mg by mouth daily. Swallow whole.    Marland Kitchen atorvastatin (LIPITOR) 20 MG tablet Take 20 mg by mouth daily.    Marland Kitchen BIDIL 20-37.5 MG tablet Take 1 tablet by mouth 3 (three) times daily.    . cholecalciferol (VITAMIN D3) 25 MCG (1000 UNIT) tablet Take 1,000 Units by mouth daily.    . metFORMIN (GLUCOPHAGE) 500 MG tablet Take 1 tablet (500 mg total) by mouth 2 (two) times daily with a meal. Resume metformin on 02/09/2020    . methocarbamol (ROBAXIN) 500 MG tablet Take 1 tablet (500 mg total) by mouth every 8 (eight) hours as needed for muscle spasms. 15 tablet 0  . metoprolol tartrate (LOPRESSOR) 50 MG tablet Take 1 tablet (50 mg total) by mouth 2 (two) times daily. (Patient taking differently: Take 25 mg by mouth 2 (two) times daily.) 1 tablet 0  . OXYGEN Inhale into the lungs daily.    Nelva Nay SOLOSTAR 300 UNIT/ML Solostar Pen Inject 8 Units into the skin at bedtime. 10 mL 0   No current facility-administered medications on file prior to visit.     Cardiovascular studies:  EKG 04/18/2020: Sinus rhythm 71 bpm Right bundle branch block  Left anterior bifascicular block Left atrial enlargement  Mobile cardiac telemetry 8 days 03/20/2020 - 03/29/2020: Dominant rhythm: Sinus. HR 43-197 bpm. Avg HR 65 bpm. 85 episodes of SVT, fastest at 197 bpm for 5 beats, longest for 19 beats at 120 bpm. 9.4% isolated SVE, rare couplet/triplets. 2.1% isolated VE-including  ventricular trigemniy, rare couplets. No atrial fibrillation/atrial flutter/VT/high grade AV block, sinus pause >3sec noted. 85 patient triggered events, correlate with supraventricular ectopy.     Echocardiogram 02/05/2020: 1. Left ventricular ejection fraction, by estimation, is 60 to 65%. The  left ventricle has normal function. The left ventricle has no regional  wall motion abnormalities. There is moderate left ventricular hypertrophy.  Left ventricular diastolic  parameters are consistent with Grade I diastolic dysfunction (impaired  relaxation).  2. Right ventricular systolic function is normal. The right ventricular  size is normal. There is moderately elevated pulmonary artery systolic  pressure at 48 mmHg.  3. Left atrial size was mildly dilated.  4. Tricuspid valve regurgitation is moderate.  5. Compared to previous outpatient study on 06/05/2019, pulmonary  hypertension is new finding.   ABI 01/19/2018: This exam reveals mildly decreased perfusion of the right lower extremity, noted at the dorsalis pedis artery level (Rt ABI 0.82) and moderately decreased perfusion of the left lower extremity, noted at the dorsalis pedis artery level (Lt ABI 0.71).  Lexiscan myoview stress test 12/19/2017: 1. The resting electrocardiogram demonstrated normal sinus rhythm, LAD, LAFB, RBBB.  Stress EKG is non-diagnostic for ischemia as it a pharmacologic stress using Lexiscan. Stress symptoms included abdominal discomfort and headache. 2. Myocardial perfusion imaging is normal without ischemia or scar. Overall left ventricular systolic function was visually  normal without regional wall motion abnormalities, however calculated at 34%.  3. This is an intermediate risk study, clinical correlation recommended.  Recent labs: 02/13/2020: Glucose 123, BUN/Cr 22/1.48. EGFR 33. Na/K 141/4.5. Albumin 2.9 H/H 10.5/33.7. MCV 81. Platelets 320  12/2015: HbA1C 8.2%  02/2008: Chol 116, TG 43, HDL  51, LDL 56   Review of Systems  Cardiovascular: Positive for syncope. Negative for chest pain, dyspnea on exertion, leg swelling and palpitations.        Vitals:   04/18/20 0838  BP: (!) 150/81  Pulse: 61  Resp: 16  SpO2: 95%     Physical Exam: Physical Exam Vitals and nursing note reviewed.  Constitutional:      General: She is not in acute distress. Neck:     Vascular: No JVD.  Cardiovascular:     Rate and Rhythm: Normal rate and regular rhythm.     Heart sounds: Normal heart sounds. No murmur heard.   Pulmonary:     Effort: Pulmonary effort is normal.     Breath sounds: Normal breath sounds. No wheezing or rales.  Musculoskeletal:     Right lower leg: Edema (Trace) present.     Left lower leg: Edema (Trace) present.            Assessment & Recommendations:  87 y/o [place] female with hypertension, type 2 DM, HFpEF, PSVT  PSVT: Continue metoprolol tartarate 50 mg bid. Discussed vagal maneuvers.  Added diltiazem 30 mg for tid prn use, if episodes of PSVT do not resolve with vagal maneuvers.   Hypertension: Reasonably well controlled at home.  HFpEF: Recommend torsemide 20 mg every other day  F/u in 6 months  Kimya Mccahill Esther Hardy, MD [facility] Pager: [phone] Office: [phone] If no answer Cell [phone]

## 2020-04-17 ENCOUNTER — Ambulatory Visit: Payer: Medicare Other | Admitting: Cardiology

## 2020-04-18 ENCOUNTER — Other Ambulatory Visit: Payer: Self-pay

## 2020-04-18 ENCOUNTER — Ambulatory Visit: Payer: Medicare Other | Admitting: Cardiology

## 2020-04-18 ENCOUNTER — Encounter: Payer: Self-pay | Admitting: Cardiology

## 2020-04-18 VITALS — BP 150/81 | HR 61 | Resp 16 | Ht 64.0 in | Wt 149.0 lb

## 2020-04-18 DIAGNOSIS — I10 Essential (primary) hypertension: Secondary | ICD-10-CM

## 2020-04-18 DIAGNOSIS — I471 Supraventricular tachycardia: Secondary | ICD-10-CM

## 2020-04-18 MED ORDER — DILTIAZEM HCL 30 MG PO TABS: 30.0000 mg | ORAL_TABLET | Freq: Three times a day (TID) | ORAL | 1 refills | Status: DC | PRN

## 2020-04-18 MED ORDER — METOPROLOL TARTRATE 50 MG PO TABS: 50.0000 mg | ORAL_TABLET | Freq: Two times a day (BID) | ORAL | 3 refills | Status: DC

## 2020-04-30 ENCOUNTER — Ambulatory Visit: Payer: Medicare Other | Admitting: Cardiology

## 2020-05-20 ENCOUNTER — Other Ambulatory Visit: Payer: Self-pay | Admitting: Cardiology

## 2020-05-20 DIAGNOSIS — I10 Essential (primary) hypertension: Secondary | ICD-10-CM

## 2020-05-20 DIAGNOSIS — I471 Supraventricular tachycardia: Secondary | ICD-10-CM

## 2020-07-29 ENCOUNTER — Other Ambulatory Visit: Payer: Self-pay | Admitting: Cardiology

## 2020-10-16 ENCOUNTER — Ambulatory Visit: Payer: Medicare Other | Admitting: Cardiology

## 2020-10-16 ENCOUNTER — Encounter: Payer: Self-pay | Admitting: Cardiology

## 2020-10-16 ENCOUNTER — Other Ambulatory Visit: Payer: Self-pay

## 2020-10-16 VITALS — BP 143/79 | HR 62 | Temp 98.3°F | Resp 17 | Ht 64.0 in | Wt 146.0 lb

## 2020-10-16 DIAGNOSIS — I471 Supraventricular tachycardia: Secondary | ICD-10-CM

## 2020-10-16 DIAGNOSIS — I10 Essential (primary) hypertension: Secondary | ICD-10-CM

## 2020-10-16 MED ORDER — ISOSORB DINITRATE-HYDRALAZINE 20-37.5 MG PO TABS: 1.0000 | ORAL_TABLET | Freq: Three times a day (TID) | ORAL | 3 refills | Status: DC

## 2020-10-16 MED ORDER — METOPROLOL TARTRATE 50 MG PO TABS: 75.0000 mg | ORAL_TABLET | Freq: Two times a day (BID) | ORAL | 3 refills | Status: DC

## 2020-10-16 MED ORDER — ATORVASTATIN CALCIUM 20 MG PO TABS: 20.0000 mg | ORAL_TABLET | Freq: Every day | ORAL | 3 refills | Status: AC

## 2020-10-16 MED ORDER — SPIRONOLACTONE 25 MG PO TABS: 25.0000 mg | ORAL_TABLET | Freq: Every morning | ORAL | 3 refills | Status: AC

## 2020-10-16 NOTE — Progress Notes (Signed)
Subjective:   Jenna Murphy, female    DOB: 03-14-1928, 87 y.o.   MRN: [id number]   Chief complaint:  PAD  HPI   87 y/o [place] American female with hypertension, type 2 DM, HFpEF, PSVT  Patient is here today with her caretaker. She is feeling very well, denies any chest pain shortness of breath. She has minimal, stable swelling in her feet. Blood pressure is well controlled.    Current Outpatient Medications on File Prior to Visit  Medication Sig Dispense Refill   aspirin 81 MG EC tablet Take 81 mg by mouth daily. Swallow whole.     atorvastatin (LIPITOR) 20 MG tablet Take 20 mg by mouth daily.     BIDIL 20-37.5 MG tablet TAKE 1 TABLET BY MOUTH THREE TIMES DAILY 90 tablet 0   BIDIL 20-37.5 MG tablet TAKE 1 TABLET BY MOUTH THREE TIMES DAILY 90 tablet 0   cholecalciferol (VITAMIN D3) 25 MCG (1000 UNIT) tablet Take 1,000 Units by mouth daily.     diltiazem (CARDIZEM) 30 MG tablet Take 1 tablet (30 mg total) by mouth 3 (three) times daily as needed. 60 tablet 1   metFORMIN (GLUCOPHAGE) 500 MG tablet Take 1 tablet (500 mg total) by mouth 2 (two) times daily with a meal. Resume metformin on 02/09/2020     methocarbamol (ROBAXIN) 500 MG tablet Take 1 tablet (500 mg total) by mouth every 8 (eight) hours as needed for muscle spasms. 15 tablet 0   metoprolol tartrate (LOPRESSOR) 50 MG tablet TAKE 1 AND 1/2 TABLETS BY MOUTH TWICE DAILY 180 tablet 3   OXYGEN Inhale into the lungs daily.     TOUJEO SOLOSTAR 300 UNIT/ML Solostar Pen Inject 8 Units into the skin at bedtime. 10 mL 0   No current facility-administered medications on file prior to visit.     Cardiovascular studies:  EKG 10/16/2020: Sinus rhythm 63 bpm  Right bundle branch block  Left anterior fascicular block   Mobile cardiac telemetry 8 days 03/20/2020 - 03/29/2020: Dominant rhythm: Sinus. HR 43-197 bpm. Avg HR 65 bpm. 54 episodes of SVT, fastest at 197 bpm for 5 beats, longest for 19 beats at 120 bpm. 9.4% isolated  SVE, rare couplet/triplets. 2.1% isolated VE-including ventricular trigemniy, rare couplets. No atrial fibrillation/atrial flutter/VT/high grade AV block, sinus pause >3sec noted. 8 patient triggered events, correlate with supraventricular ectopy.      Echocardiogram 02/05/2020:  1. Left ventricular ejection fraction, by estimation, is 60 to 65%. The  left ventricle has normal function. The left ventricle has no regional  wall motion abnormalities. There is moderate left ventricular hypertrophy.  Left ventricular diastolic  parameters are consistent with Grade I diastolic dysfunction (impaired  relaxation).   2. Right ventricular systolic function is normal. The right ventricular  size is normal. There is moderately elevated pulmonary artery systolic  pressure at 48 mmHg.   3. Left atrial size was mildly dilated.   4. Tricuspid valve regurgitation is moderate.   5. Compared to previous outpatient study on 06/05/2019, pulmonary  hypertension is new finding.   ABI 01/19/2018: This exam reveals mildly decreased perfusion of the right lower extremity, noted at the dorsalis pedis artery level (Rt ABI 0.82) and moderately decreased perfusion of the left lower extremity, noted at the dorsalis pedis artery level (Lt ABI 0.71).  Lexiscan myoview stress test 12/19/2017: 1. The resting electrocardiogram demonstrated normal sinus rhythm, LAD, LAFB, RBBB.  Stress EKG is non-diagnostic for ischemia as it a pharmacologic stress  using Lexiscan. Stress symptoms included abdominal discomfort and headache. 2. Myocardial perfusion imaging is normal without ischemia or scar. Overall left ventricular systolic function was visually normal without regional wall motion abnormalities, however calculated at 34%.  3. This is an intermediate risk study, clinical correlation recommended.  Recent labs: 02/13/2020: Glucose 123, BUN/Cr 22/1.48. EGFR 33. Na/K 141/4.5. Albumin 2.9 H/H 10.5/33.7. MCV 81. Platelets  320  12/2015: HbA1C 8.2%  02/2008: Chol 116, TG 43, HDL 51, LDL 56   Review of Systems  Cardiovascular:  Positive for leg swelling (Mild, stable) and syncope. Negative for chest pain, dyspnea on exertion and palpitations.       Vitals:   10/16/20 1142  BP: (!) 143/79  Pulse: 62  Resp: 17  Temp: 98.3 F (36.8 C)  SpO2: 96%     Physical Exam: Physical Exam Vitals and nursing note reviewed.  Constitutional:      General: She is not in acute distress. Neck:     Vascular: No JVD.  Cardiovascular:     Rate and Rhythm: Normal rate and regular rhythm.     Heart sounds: Normal heart sounds. No murmur heard. Pulmonary:     Effort: Pulmonary effort is normal.     Breath sounds: Normal breath sounds. No wheezing or rales.  Musculoskeletal:     Right lower leg: Edema (Trace) present.     Left lower leg: Edema (Trace) present.           Assessment & Recommendations:  87 y/o [place] female with hypertension, type 2 DM, HFpEF, PSVT  PSVT: Well controlled on metoprolol tartarate 50 mg bid.  Hypertension: Well controlled  HFpEF: Euvolumic  F/u in 6 months  Stetson Pelaez Esther Hardy, MD [facility] [place] Pager: [phone] Office: [phone] If no answer Cell [phone]

## 2020-12-22 ENCOUNTER — Emergency Department (HOSPITAL_COMMUNITY)
Admission: EM | Admit: 2020-12-22 | Discharge: 2020-12-22 | Disposition: A | Discharge status: Home / Self Care | Payer: Medicare Other | Attending: Emergency Medicine | Admitting: Emergency Medicine

## 2020-12-22 ENCOUNTER — Encounter (HOSPITAL_COMMUNITY): Payer: Self-pay | Admitting: *Deleted

## 2020-12-22 ENCOUNTER — Other Ambulatory Visit: Payer: Self-pay

## 2020-12-22 ENCOUNTER — Emergency Department (HOSPITAL_COMMUNITY): Payer: Medicare Other

## 2020-12-22 DIAGNOSIS — Z96612 Presence of left artificial shoulder joint: Secondary | ICD-10-CM | POA: Insufficient documentation

## 2020-12-22 DIAGNOSIS — Z79899 Other long term (current) drug therapy: Secondary | ICD-10-CM | POA: Diagnosis not present

## 2020-12-22 DIAGNOSIS — N1831 Chronic kidney disease, stage 3a: Secondary | ICD-10-CM | POA: Diagnosis not present

## 2020-12-22 DIAGNOSIS — H5789 Other specified disorders of eye and adnexa: Secondary | ICD-10-CM

## 2020-12-22 DIAGNOSIS — I509 Heart failure, unspecified: Secondary | ICD-10-CM | POA: Diagnosis not present

## 2020-12-22 DIAGNOSIS — S0511XA Contusion of eyeball and orbital tissues, right eye, initial encounter: Secondary | ICD-10-CM | POA: Insufficient documentation

## 2020-12-22 DIAGNOSIS — Z87891 Personal history of nicotine dependence: Secondary | ICD-10-CM | POA: Diagnosis not present

## 2020-12-22 DIAGNOSIS — Z7984 Long term (current) use of oral hypoglycemic drugs: Secondary | ICD-10-CM | POA: Insufficient documentation

## 2020-12-22 DIAGNOSIS — E1122 Type 2 diabetes mellitus with diabetic chronic kidney disease: Secondary | ICD-10-CM | POA: Insufficient documentation

## 2020-12-22 DIAGNOSIS — I130 Hypertensive heart and chronic kidney disease with heart failure and stage 1 through stage 4 chronic kidney disease, or unspecified chronic kidney disease: Secondary | ICD-10-CM | POA: Diagnosis not present

## 2020-12-22 DIAGNOSIS — S0083XA Contusion of other part of head, initial encounter: Secondary | ICD-10-CM | POA: Diagnosis not present

## 2020-12-22 DIAGNOSIS — Z96611 Presence of right artificial shoulder joint: Secondary | ICD-10-CM | POA: Insufficient documentation

## 2020-12-22 DIAGNOSIS — W19XXXA Unspecified fall, initial encounter: Secondary | ICD-10-CM | POA: Diagnosis not present

## 2020-12-22 DIAGNOSIS — S0591XA Unspecified injury of right eye and orbit, initial encounter: Secondary | ICD-10-CM | POA: Diagnosis present

## 2020-12-22 NOTE — ED Triage Notes (Signed)
Pt reports falling two weeks ago, hit her right eye on furniture and had bruising but no pain or vision changes. Did not see md at that time. Yesterday began having itching and irritation to her right eye and now has swelling, but no pain.

## 2020-12-22 NOTE — ED Provider Notes (Signed)
Emergency Medicine Provider Triage Evaluation Note  Jenna Murphy , a 87 y.o. female  was evaluated in triage.  Pt complains of right-sided periorbital pain and bruising since fall 2 weeks ago, now with swelling around the eye.  Review of Systems  Positive: Right-sided facial pain periorbital swelling, eye itching Negative: Eye discharge or pain, blurry visio  Physical Exam  BP (!) 173/90 (BP Location: Right Arm)   Pulse 62   Temp 98.7 F (37.1 C) (Oral)   Resp 18   SpO2 98%  Gen:   Awake, no distress   Resp:  Normal effort  MSK:   Moves extremities without difficulty  Other:  Bruising around the right orbit with edema of the upper eyelid.  PERRL, EOMI, no conjunctival injection.  Tenderness palpation and asymmetric contour of the right orbit relative to the left.  Medical Decision Making  Medically screening exam initiated at 10:48 AM.  Appropriate orders placed.  Jenna Murphy was informed that the remainder of the evaluation will be completed by another provider, this initial triage assessment does not replace that evaluation, and the importance of remaining in the ED until their evaluation is complete.  We will proceed with imaging, fall is several days ago, patient is not anticoagulated.  This chart was dictated using voice recognition software, Dragon. Despite the best efforts of this provider to proofread and correct errors, errors may still occur which can change documentation meaning.    Paris Lore, PA-C 12/22/20 1054    Tanda Rockers A, DO 12/22/20 1743

## 2020-12-22 NOTE — ED Notes (Signed)
EDP at bedside  

## 2020-12-22 NOTE — ED Notes (Signed)
Pt reports falling in her kitchen about 2 weeks ago and landing with her face against a countertop. She had significant bruising and swelling at the time which has mostly resolved, but this morning her swelling worsened and she had new eye irritation so she wanted to be sure she had not seriously injured herself. Denies pain, no LOC. Pt is a/o x 4 and is now ambulatory with a walker although this fall at home occurred prior to her use of assistive device.

## 2020-12-22 NOTE — ED Notes (Signed)
Unable to obtain signature at time of discharge as no signature pad was available for the hall bed. Pt verbalized understanding of all discharge instructions and plans for follow-up care.

## 2020-12-22 NOTE — ED Provider Notes (Signed)
[facility] EMERGENCY DEPARTMENT Provider Note   CSN: [id number] Arrival date & time: 12/22/20  0944     History Chief Complaint  Patient presents with   Eye Problem    Jenna Murphy is a 87 y.o. female PMH hypertension, diabetes and  childhood polio presenting today with a complaint of eye swelling.  Patient reports that 2 weeks ago Jenna Murphy fell forward and hit Jenna Murphy face on a table in Jenna Murphy living room.  Jenna Murphy denies LOC but cannot state why Jenna Murphy fell.  No history of syncope or seizures.  Jenna Murphy is followed by cardiologist for PSVT.  At that time the patient did not present to the hospital however today Jenna Murphy woke up and Jenna Murphy eye was increasingly swollen.  Here with Jenna Murphy caretaker.  Denies any recent illness, headache or changes in Jenna Murphy vision.  No leg swelling.   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   Lumbar transverse process fracture (HCC) 02/09/2020   Fall 02/09/2020   DOE (dyspnea on exertion) 02/06/2020   Acute heart failure with preserved ejection fraction (HFpEF) (HCC)    AKI (acute kidney injury) (HCC)    Type 2 diabetes mellitus with hyperlipidemia (HCC)    Type 2 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)    CHF (congestive heart failure) (HCC) 02/04/2020   Palpitations 05/28/2019   Chronic heart failure with preserved ejection fraction (HCC) 05/28/2019   Exertional dyspnea 05/27/2019   PAD (peripheral artery disease) (HCC) 09/25/2018   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/23/2014   Chest pain 05/22/2011   Essential hypertension  05/22/2011    Past Surgical History:  Procedure Laterality Date   CHOLECYSTECTOMY     REVERSE SHOULDER ARTHROPLASTY Left 05/23/2014   Procedure: REVERSE SHOULDER ARTHROPLASTY;  Surgeon: Mable Paris, MD;  Location: [facility] OR;  Service: Orthopedics;  Laterality: Left;   REVERSE SHOULDER ARTHROPLASTY Right 12/11/2015   Procedure: REVERSE SHOULDER ARTHROPLASTY;  Surgeon: Jones Broom, MD;  Location: [facility] OR;  Service: Orthopedics;  Laterality: Right;  Right reverse total shoulder arthroplasty   STERIOD INJECTION Left 05/23/2014   Procedure: STEROID INJECTION THUMB;  Surgeon: Mable Paris, MD;  Location: [facility] OR;  Service: Orthopedics;  Laterality: Left;  Steroid injection left thumb     OB History   No obstetric history on file.     Family History  Problem Relation Age of Onset   Heart attack Mother        Died of MI, 69 at 41    Cirrhosis Father        alcohol induced     Social History   Tobacco Use   Smoking status: Former    Packs/day: 1.00    Years: 20.00    Pack years: 20.00    Types: Cigarettes    Quit date: 04/12/1968    Years since quitting: 52.7   Smokeless tobacco: Never  Substance Use Topics   Alcohol use: No    Alcohol/week: 0.0 standard drinks  Drug use: No    Home Medications Prior to Admission medications   Medication Sig Start Date End Date Taking? Authorizing Provider  atorvastatin (LIPITOR) 20 MG tablet Take 1 tablet (20 mg total) by mouth daily. 10/16/20   Patwardhan, Anabel BeneManish J, MD  cholecalciferol (VITAMIN D3) 25 MCG (1000 UNIT) tablet Take 1,000 Units by mouth daily.    [provider]  isosorbide-hydrALAZINE (BIDIL) 20-37.5 MG tablet Take 1 tablet by mouth 3 (three) times daily. 10/16/20   Patwardhan, Anabel BeneManish J, MD  metFORMIN (GLUCOPHAGE) 500 MG tablet Take 1 tablet (500 mg total) by mouth 2 (two) times daily with a meal. Resume metformin on 02/09/2020 02/07/20   Edsel PetrinMikhail, Maryann, DO  metoprolol tartrate (LOPRESSOR) 50 MG tablet  Take 1.5 tablets (75 mg total) by mouth 2 (two) times daily. 10/16/20   Patwardhan, Anabel BeneManish J, MD  spironolactone (ALDACTONE) 25 MG tablet Take 1 tablet (25 mg total) by mouth every morning. 10/16/20   Patwardhan, Anabel BeneManish J, MD    Allergies    Patient has no known allergies.  Review of Systems   Review of Systems  Constitutional:  Negative for chills and fever.  Respiratory:  Negative for cough, chest tightness and shortness of breath.   Cardiovascular:  Negative for chest pain and palpitations.  Neurological:  Negative for dizziness, seizures, syncope, light-headedness, numbness and headaches.  Psychiatric/Behavioral:  Negative for confusion.   All other systems reviewed and are negative.  Physical Exam Updated Vital Signs BP (!) 163/84 (BP Location: Left Arm)   Pulse 60   Temp 98.7 F (37.1 C) (Oral)   Resp 18   SpO2 100%   Physical Exam Vitals and nursing note reviewed.  Constitutional:      General: Jenna Murphy is not in acute distress.    Appearance: Normal appearance.  HENT:     Head: Normocephalic and atraumatic.  Eyes:     General: No scleral icterus.    Extraocular Movements: Extraocular movements intact.     Conjunctiva/sclera: Conjunctivae normal.     Pupils: Pupils are equal, round, and reactive to light.  Cardiovascular:     Rate and Rhythm: Normal rate and regular rhythm.  Pulmonary:     Effort: Pulmonary effort is normal. No respiratory distress.     Breath sounds: Normal breath sounds. No wheezing.  Musculoskeletal:        General: Swelling and tenderness present. No deformity or signs of injury. Normal range of motion.     Comments: Mild swelling and tenderness around the orbit of Jenna Murphy right eye.  More so laterally.  Skin:    General: Skin is warm and dry.     Findings: Bruising (Around the orbit of Jenna Murphy right) present. No rash.  Neurological:     Mental Status: Jenna Murphy is alert.     Coordination: Coordination normal.  Psychiatric:        Mood and Affect: Mood normal.         Behavior: Behavior normal.    ED Results / Procedures / Treatments   Labs (all labs ordered are listed, but only abnormal results are displayed) Labs Reviewed - No data to display  EKG None  Radiology CT Head Wo Contrast  Result Date: 12/22/2020 CLINICAL DATA:  Fall 2 weeks ago with right facial injury. EXAM: CT HEAD WITHOUT CONTRAST TECHNIQUE: Contiguous axial images were obtained from the base of the skull through the vertex without intravenous contrast. COMPARISON:  02/08/2020 FINDINGS: Brain: Stable calcification of the globus pallidus nuclei, likely physiologic.  Periventricular white matter and corona radiata hypodensities favor chronic ischemic microvascular white matter disease. Otherwise, the brainstem, cerebellum, cerebral peduncles, thalamus, basal ganglia, basilar cisterns, and ventricular system appear within normal limits. No intracranial hemorrhage, mass lesion, or acute CVA. Vascular: There is atherosclerotic calcification of the cavernous carotid arteries bilaterally. Skull: Unremarkable Sinuses/Orbits: Please see dedicated maxillofacial CT. Other: Asymmetric right periorbital soft tissue swelling especially laterally. IMPRESSION: 1. No acute intracranial findings. 2. Periventricular white matter and corona radiata hypodensities favor chronic ischemic microvascular white matter disease. 3. Atherosclerosis. 4. Asymmetric right periorbital soft tissue swelling. Electronically Signed   By: Gaylyn Rong M.D.   On: 12/22/2020 12:18   CT Cervical Spine Wo Contrast  Result Date: 12/22/2020 CLINICAL DATA:  Fall 2 weeks ago striking the right face, now with swelling. EXAM: CT CERVICAL SPINE WITHOUT CONTRAST TECHNIQUE: Multidetector CT imaging of the cervical spine was performed without intravenous contrast. Multiplanar CT image reconstructions were also generated. COMPARISON:  01/09/2020 FINDINGS: Alignment: 2 mm of grade 1 degenerative anterolisthesis at C5-6 and C7-T1,  similar to prior exam. Skull base and vertebrae: Superior spurring at the anterior C1-2 articulation with minimal chronic fragmentation unchanged from prior. No cervical spine fracture or acute bony findings. Soft tissues and spinal canal: Atherosclerotic calcification of the aortic arch and branch vessels. Disc levels: Uncinate and facet spurring cause right osseous foraminal stenosis at C4-5, and left foraminal stenosis at C4-5, C5-6, and C6-7. Upper chest: Stable subpleural scarring and nodularity anteriorly in the upper lobes as shown on 02/09/2020. Other: No supplemental non-categorized findings. IMPRESSION: 1. No acute cervical spine findings. 2. Multilevel foraminal impingement due to spurring. 3. Atherosclerosis.  Aortic Atherosclerosis (ICD10-I70.0). Electronically Signed   By: Gaylyn Rong M.D.   On: 12/22/2020 12:13   CT Maxillofacial WO CM  Result Date: 12/22/2020 CLINICAL DATA:  Fall 2 weeks ago, facial bruising with new itching and irritation in the right periorbital region. EXAM: CT MAXILLOFACIAL WITHOUT CONTRAST TECHNIQUE: Multidetector CT imaging of the maxillofacial structures was performed. Multiplanar CT image reconstructions were also generated. COMPARISON:  Head CT 02/08/2020 FINDINGS: Osseous: No facial fracture is identified. The patient is edentulous. Orbits: There is notable right periorbital soft tissue swelling particularly laterally and inferiorly, this could certainly be due to bruising or inflammation. No significant intraconal or intraorbital abnormality is observed. The globes appear intact. Sinuses: Unremarkable Soft tissues: Right periorbital soft tissue swelling is noted above, without postseptal/intraorbital extension. Limited intracranial: Periventricular white matter and corona radiata hypodensities favor chronic ischemic microvascular white matter disease. IMPRESSION: 1. Right periorbital soft tissue swelling especially inferiorly and laterally, which could be from  inflammation or bruising. No intraorbital abnormality is identified and no facial fracture is observed. Electronically Signed   By: Gaylyn Rong M.D.   On: 12/22/2020 12:22    Procedures Procedures   Medications Ordered in ED Medications - No data to display  ED Course  I have reviewed the triage vital signs and the nursing notes.  Pertinent labs & imaging results that were available during my care of the patient were reviewed by me and considered in my medical decision making (see chart for details).    MDM Rules/Calculators/A&P Jenna Murphy is a 87 y.o. female PMH hypertension, diabetes and  childhood polio presenting today with a complaint of eye swelling.  Patient reports that 2 weeks ago Jenna Murphy fell forward and hit Jenna Murphy face on a table in Jenna Murphy living room.  Jenna Murphy denies LOC but cannot state why Jenna Murphy fell.  No  history of syncope or seizures.  Jenna Murphy is followed by cardiologist for PSVT.  At that time the patient did not present to the hospital however today Jenna Murphy woke up and Jenna Murphy eye was increasingly swollen.  Here with Jenna Murphy caretaker.  Denies any recent illness, headache or changes in Jenna Murphy vision.  No leg swelling.  When I evaluated the patient Jenna Murphy was without complaint.  Patient and Jenna Murphy caregiver happy to know that patient is without fractures or brain bleeding.  We discussed at length Jenna Murphy cardiac history as well as reasons that people fall.  I informed the patient that it is concerning when people have multiple falls that they cannot explain.  Patient and Jenna Murphy caregiver are agreeable to close follow-up with Jenna Murphy primary care provider.  They report that Jenna Murphy has an appointment with them next week.  Patient CTs negative only showing some periorbital swelling.  Patient neuro exam without abnormalities.  PERRLA and EOM intact.  Patient denying any pain with eye movement.  Jenna Murphy right eye is slightly sensitive to the touch laterally, however Jenna Murphy says that this is improving.  As opposed to ibuprofen due to  Jenna Murphy previous kidney injury.  Patient denying any urinary symptoms or signs of UTI.  I believe the patient stable for discharge at this time with Jenna Murphy caregiver.  They will follow-up with primary care and cardiology as previously scheduled.  Agreeable to discharge plan.  Final Clinical Impression(s) / ED Diagnoses Final diagnoses:  Periorbital swelling  Fall, initial encounter    Rx / DC Orders Results and diagnoses were explained to the patient. Return precautions discussed in full. Patient and caregiver had no additional questions and expressed complete understanding.     Woodroe Chen 12/22/20 2010    Benjiman Core, MD 12/22/20 2350

## 2020-12-22 NOTE — Discharge Instructions (Addendum)
Your work-up is reassuring today.  Your imaging showed no evidence of fractures or bleeding in your head.  You should keep your standing appointments with your primary care provider as well as your cardiologist to monitor your heart and overall health.  It was a pleasure to meet you today and I hope that you feel better.

## 2021-01-08 ENCOUNTER — Other Ambulatory Visit: Payer: Self-pay

## 2021-01-08 ENCOUNTER — Encounter (INDEPENDENT_AMBULATORY_CARE_PROVIDER_SITE_OTHER): Payer: Self-pay

## 2021-01-08 ENCOUNTER — Encounter (INDEPENDENT_AMBULATORY_CARE_PROVIDER_SITE_OTHER): Payer: Medicare Other | Admitting: Ophthalmology

## 2021-01-08 ENCOUNTER — Ambulatory Visit (INDEPENDENT_AMBULATORY_CARE_PROVIDER_SITE_OTHER): Payer: Medicare Other | Admitting: Ophthalmology

## 2021-01-08 ENCOUNTER — Encounter (INDEPENDENT_AMBULATORY_CARE_PROVIDER_SITE_OTHER): Payer: Self-pay | Admitting: Ophthalmology

## 2021-01-08 DIAGNOSIS — E113492 Type 2 diabetes mellitus with severe nonproliferative diabetic retinopathy without macular edema, left eye: Secondary | ICD-10-CM | POA: Diagnosis not present

## 2021-01-08 DIAGNOSIS — E113592 Type 2 diabetes mellitus with proliferative diabetic retinopathy without macular edema, left eye: Secondary | ICD-10-CM | POA: Insufficient documentation

## 2021-01-08 DIAGNOSIS — H4312 Vitreous hemorrhage, left eye: Secondary | ICD-10-CM

## 2021-01-08 DIAGNOSIS — H43812 Vitreous degeneration, left eye: Secondary | ICD-10-CM

## 2021-01-08 NOTE — Assessment & Plan Note (Signed)
May 1-Day need peripheral PRP but no posterior disease largely peripheral and anterior  30-year history of known diabetes mellitus

## 2021-01-08 NOTE — Progress Notes (Signed)
01/08/2021     CHIEF COMPLAINT Patient presents for  Chief Complaint  Patient presents with   Retina Evaluation      HISTORY OF PRESENT ILLNESS: Jenna Murphy is a 87 y.o. female who presents to the clinic today for:   HPI     Retina Evaluation   In left eye.  This started 4 days ago.  Duration of 4 days.  Associated Symptoms Floaters.  Negative for Flashes, Distortion, Blind Spot, Pain and Photophobia.  Context:  distance vision, mid-range vision and near vision.  Treatments tried include no treatments.        Comments   NP- acute vitreous hem OS- referred by Dr. Dillon Bjork at [facility]. Pt states "I went to Dr. Juliann Pulse for floaters in my left eye. He said he thought it could be some blood in my eye. I can hardly see from the floaters, it is scary."  Pt states "the floaters started about 3-4 days ago. 6-7 years ago I had cataract surgery, I had floaters, they went away. All of a sudden a couple days ago I woke up with these floaters in the left eye but not my right eye. I told the other doctor about a month ago I fell and I hit my right eye on the edge of the table and it was really swollen." Pt is Type 2 Diabetic, she has not checked her BS today.  A1C: Pt states "7.0 I think"        Last edited by Nelva Nay on 01/08/2021  3:00 PM.      Referring physician: [facility] [address] [place],  [place] [address]  HISTORICAL INFORMATION:   Selected notes from the MEDICAL RECORD NUMBER    Lab Results  Component Value Date   HGBA1C 7.6 (H) 02/09/2020     CURRENT MEDICATIONS: No current outpatient medications on file. (Ophthalmic Drugs)   No current facility-administered medications for this visit. (Ophthalmic Drugs)   Current Outpatient Medications (Other)  Medication Sig   atorvastatin (LIPITOR) 20 MG tablet Take 1 tablet (20 mg total) by mouth daily.   cholecalciferol (VITAMIN D3) 25 MCG (1000 UNIT) tablet  Take 1,000 Units by mouth daily.   isosorbide-hydrALAZINE (BIDIL) 20-37.5 MG tablet Take 1 tablet by mouth 3 (three) times daily.   metFORMIN (GLUCOPHAGE) 500 MG tablet Take 1 tablet (500 mg total) by mouth 2 (two) times daily with a meal. Resume metformin on 02/09/2020   metoprolol tartrate (LOPRESSOR) 50 MG tablet Take 1.5 tablets (75 mg total) by mouth 2 (two) times daily.   spironolactone (ALDACTONE) 25 MG tablet Take 1 tablet (25 mg total) by mouth every morning.   No current facility-administered medications for this visit. (Other)      REVIEW OF SYSTEMS:    ALLERGIES No Known Allergies  PAST MEDICAL HISTORY Past Medical History:  Diagnosis Date   Arthritis    Carpal tunnel syndrome of left wrist    Diabetes mellitus    not on insulin   Gallstone pancreatitis 02/2008   Heart murmur    Hypertension    Polio    as child    Past Surgical History:  Procedure Laterality Date   CHOLECYSTECTOMY     REVERSE SHOULDER ARTHROPLASTY Left 05/23/2014   Procedure: REVERSE SHOULDER ARTHROPLASTY;  Surgeon: Mable Paris, MD;  Location: [facility] OR;  Service: Orthopedics;  Laterality: Left;   REVERSE SHOULDER ARTHROPLASTY Right 12/11/2015  Procedure: REVERSE SHOULDER ARTHROPLASTY;  Surgeon: Jones Broom, MD;  Location: [facility] OR;  Service: Orthopedics;  Laterality: Right;  Right reverse total shoulder arthroplasty   STERIOD INJECTION Left 05/23/2014   Procedure: STEROID INJECTION THUMB;  Surgeon: Mable Paris, MD;  Location: [facility] OR;  Service: Orthopedics;  Laterality: Left;  Steroid injection left thumb    FAMILY HISTORY Family History  Problem Relation Age of Onset   Heart attack Mother        Died of MI, dec at 34    Cirrhosis Father        alcohol induced     SOCIAL HISTORY Social History   Tobacco Use   Smoking status: Former    Packs/day: 1.00    Years: 20.00    Pack years: 20.00    Types: Cigarettes    Quit date: 04/12/1968    Years since quitting: 52.7    Smokeless tobacco: Never  Substance Use Topics   Alcohol use: No    Alcohol/week: 0.0 standard drinks   Drug use: No         OPHTHALMIC EXAM:  Base Eye Exam     Visual Acuity (ETDRS)       Right Left   Dist cc 20/80 -2 20/40 -1   Dist ph cc 20/25 -2 20/25    Correction: Glasses  Pt cannot see as well as normal she states because at Dr Juliann Pulse she has already been dilated in both eyes.        Tonometry (Tonopen, 3:05 PM)       Right Left   Pressure 14 10         Pupils       Dark Shape React APD   Right Pharma. Dilated Round Fixed None   Left Pharma. Dilated Round Fixed None         Visual Fields (Counting fingers)       Left Right    Full Full         Extraocular Movement       Right Left    Full Full         Neuro/Psych     Oriented x3: Yes   Mood/Affect: Normal         Dilation     Both eyes: 1.0% Mydriacyl, 2.5% Phenylephrine @ 3:05 PM  Pt was dilated around at Dr. Juliann Pulse office.          Slit Lamp and Fundus Exam     External Exam       Right Left   External Normal Normal         Slit Lamp Exam       Right Left   Lids/Lashes Normal Normal   Conjunctiva/Sclera White and quiet White and quiet   Cornea Clear Clear   Anterior Chamber Deep and quiet Deep and quiet   Iris Round and reactive Round and reactive   Lens Clear Clear   Anterior Vitreous Normal Normal         Fundus Exam       Right Left   Posterior Vitreous Normal Vitreous hemorrhage, mostly over the posterior pole optic nerve macular region.   Disc Normal Normal, no N/V   C/D Ratio 0.35 0.35   Macula Normal Normal   Vessels Normal Normal   Periphery Normal Normal, 25 diopter, 20 diopter, 90 diopter exam, scleral depressed            IMAGING AND PROCEDURES  Imaging and  Procedures for 01/08/21  OCT, Retina - OU - Both Eyes       Right Eye Quality was good. Scan locations included subfoveal. Central Foveal Thickness: 305.    Left Eye Scan locations included subfoveal. Central Foveal Thickness: 235. Progression has no prior data.   Notes OD with clear media.  No active maculopathy  OS with moderate vitreous debris.  Centrally there is no significant macular thickening     Color Fundus Photography Optos - OU - Both Eyes       Right Eye Progression has no prior data. Disc findings include normal observations. Macula : normal observations. Vessels : normal observations. Periphery : normal observations.   Left Eye Progression has no prior data. Disc findings include normal observations. Macula : normal observations. Vessels : normal observations. Periphery : normal observations.   Notes Preretinal vitreous hemorrhage localized over the posterior pole likely related to posterior vitreous detachment in the left eye.  No obvious NVE although the patient does have severe NPDR peripheral     B-Scan Ultrasound - OS - Left Eye       Quality was good. Findings included vitreous hemorrhage, vitreous opacities.   Notes No retinal holes or tears             ASSESSMENT/PLAN:  Vitreous hemorrhage of left eye (HCC) The nature of the vitreous hemorrhage was discussed with the patient as well as the common causes.   Patients with diabetic eye disease may develop retinal neovascularization.  Other eye conditions develop retinal  neovascularization secondary to retinal venous occlusions.   Vitreous hemorrhage may result from spontaneous vitreous detachment or retinal breaks.  Blunt trauma is a common cause as well.  The need for serial evaluation of the fundus (interior of the eye) until  clear views are obtained was addressed. An occasional need  to monitor the condition by in office  B-scan ultrasonography in the case of dense vitreous hemorrhage was discussed.  OS new onset likely PVD related.  Nonetheless with severe NPDR we will continue to look for early signs of PDR with secondary vitreous hemorrhage.  I  reassured the patient this is likely clear over time and this in no way will trigger blindness.  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 Vitreous hemorrhage of left eye (HCC)  H43.12 OCT, Retina - OU - Both Eyes    Color Fundus Photography Optos - OU - Both Eyes    B-Scan Ultrasound - OS - Left Eye    2. 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 Posterior vitreous detachment of left eye  H43.812 OCT, Retina - OU - Both Eyes    Color Fundus Photography Optos - OU - Both Eyes  1.  OS PVD with secondary central vitreous hemorrhage, likely posterior vitreous detachment related nonetheless we will continue to monitor closely as underlying severe NPDR present peripherally  No holes or tears on peripheral careful dilate examination with scleral depression as well as on B-scan ultrasonography OS  2.  3.  Ophthalmic Meds Ordered this visit:  No orders of the defined types were placed in this encounter.      Return in about 3 weeks (around 01/29/2021) for dilate, OS, COLOR FP.  There are no Patient Instructions on file for this visit.   Explained the diagnoses, plan, and follow up with the patient and they expressed understanding.  Patient expressed  understanding of the importance of proper follow up care.   Alford Highland Kirtan Sada M.D. Diseases & Surgery of the Retina and Vitreous [facility] 01/08/21     Abbreviations: M myopia (nearsighted); A astigmatism; H hyperopia (farsighted); P presbyopia; Mrx spectacle prescription;  CTL contact lenses; OD right eye; OS left eye; OU both eyes  XT exotropia; ET esotropia; PEK punctate epithelial keratitis; PEE punctate epithelial erosions; DES dry eye syndrome; MGD meibomian gland dysfunction; ATs artificial tears; PFAT's preservative free artificial tears; NSC nuclear sclerotic cataract; PSC posterior subcapsular cataract; ERM epi-retinal membrane; PVD posterior vitreous detachment; RD retinal detachment; DM diabetes mellitus; DR diabetic retinopathy; NPDR non-proliferative diabetic retinopathy; PDR proliferative diabetic retinopathy; CSME clinically significant macular edema; DME diabetic macular edema; dbh dot blot hemorrhages; CWS cotton wool spot; POAG primary open angle glaucoma; C/D cup-to-disc ratio; HVF humphrey visual field; GVF goldmann visual field; OCT optical coherence tomography; IOP intraocular pressure; BRVO Branch retinal vein occlusion; CRVO central retinal vein occlusion; CRAO central retinal artery occlusion; BRAO branch retinal artery occlusion; RT retinal tear; SB scleral buckle; PPV pars plana vitrectomy; VH Vitreous hemorrhage; PRP panretinal laser photocoagulation; IVK intravitreal kenalog; VMT vitreomacular traction; MH Macular hole;  NVD neovascularization of the disc; NVE neovascularization elsewhere; AREDS age related eye disease study; ARMD age related macular degeneration; POAG primary open angle glaucoma; EBMD epithelial/anterior basement membrane dystrophy; ACIOL anterior chamber intraocular lens; IOL intraocular lens; PCIOL posterior chamber intraocular lens; Phaco/IOL phacoemulsification with intraocular lens placement; PRK photorefractive keratectomy; LASIK laser  assisted in situ keratomileusis; HTN hypertension; DM diabetes mellitus; COPD chronic obstructive pulmonary disease

## 2021-01-08 NOTE — Assessment & Plan Note (Addendum)
The nature of the vitreous hemorrhage was discussed with the patient as well as the common causes.   Patients with diabetic eye disease may develop retinal neovascularization.  Other eye conditions develop retinal  neovascularization secondary to retinal venous occlusions.   Vitreous hemorrhage may result from spontaneous vitreous detachment or retinal breaks.  Blunt trauma is a common cause as well.  The need for serial evaluation of the fundus (interior of the eye) until  clear views are obtained was addressed. An occasional need  to monitor the condition by in office  B-scan ultrasonography in the case of dense vitreous hemorrhage was discussed.  OS new onset likely PVD related.  Nonetheless with severe NPDR we will continue to look for early signs of PDR with secondary vitreous hemorrhage.  I reassured the patient this is likely clear over time and this in no way will trigger blindness.  However if this worsens significantly she may contact the office again and prior to return to see [place] in 2 to 3 weeks  Surgical intervention can be undertaken anytime if the bleeding for the floaters hampers her ability to see or worsen with no trouble with no trouble and no harm to wait and see what happens spontaneous clearing

## 2021-01-20 ENCOUNTER — Ambulatory Visit (INDEPENDENT_AMBULATORY_CARE_PROVIDER_SITE_OTHER): Payer: Medicare Other | Admitting: Ophthalmology

## 2021-01-20 ENCOUNTER — Encounter (INDEPENDENT_AMBULATORY_CARE_PROVIDER_SITE_OTHER): Payer: Self-pay | Admitting: Ophthalmology

## 2021-01-20 ENCOUNTER — Other Ambulatory Visit: Payer: Self-pay

## 2021-01-20 DIAGNOSIS — H4312 Vitreous hemorrhage, left eye: Secondary | ICD-10-CM

## 2021-01-20 DIAGNOSIS — H43812 Vitreous degeneration, left eye: Secondary | ICD-10-CM | POA: Diagnosis not present

## 2021-01-20 DIAGNOSIS — E113492 Type 2 diabetes mellitus with severe nonproliferative diabetic retinopathy without macular edema, left eye: Secondary | ICD-10-CM

## 2021-01-20 NOTE — Assessment & Plan Note (Signed)
Dispersed debris, now more symptomatic to the patient hampering activities of daily living

## 2021-01-20 NOTE — Progress Notes (Signed)
01/20/2021     CHIEF COMPLAINT Patient presents for  Chief Complaint  Patient presents with   Flashes/floaters      HISTORY OF PRESENT ILLNESS: Jenna Murphy is a 87 y.o. female who presents to the clinic today for:   HPI     Flashes/floaters   In left eye.  This started 2 weeks ago.  Duration of 2 weeks.  Duration Constant.  Characterized as small, large and spots.  Since onset it is gradually worsening.  Associated Symptoms Floaters.  Negative for Flashes, Distortion and Blind Spot.        Comments   WIP- Floaters OS Pt states, "I am having a worse time with not being able to tolerate these things in my eyes. My eye just seems nasty and I am so frustrated. There are large and small spots and some look like strings and no matter where  I look they are there and I cannot take it anymore."      Last edited by Demetrios Loll, COA on 01/20/2021  1:22 PM.      Referring physician: Andi Devon, MD [address] [place],  [place] [address]  HISTORICAL INFORMATION:   Selected notes from the MEDICAL RECORD NUMBER    Lab Results  Component Value Date   HGBA1C 7.6 (H) 02/09/2020     CURRENT MEDICATIONS: No current outpatient medications on file. (Ophthalmic Drugs)   No current facility-administered medications for this visit. (Ophthalmic Drugs)   Current Outpatient Medications (Other)  Medication Sig   atorvastatin (LIPITOR) 20 MG tablet Take 1 tablet (20 mg total) by mouth daily.   cholecalciferol (VITAMIN D3) 25 MCG (1000 UNIT) tablet Take 1,000 Units by mouth daily.   isosorbide-hydrALAZINE (BIDIL) 20-37.5 MG tablet Take 1 tablet by mouth 3 (three) times daily.   metFORMIN (GLUCOPHAGE) 500 MG tablet Take 1 tablet (500 mg total) by mouth 2 (two) times daily with a meal. Resume metformin on 02/09/2020   metoprolol tartrate (LOPRESSOR) 50 MG tablet Take 1.5 tablets (75 mg total) by mouth 2 (two) times daily.   spironolactone (ALDACTONE) 25 MG  tablet Take 1 tablet (25 mg total) by mouth every morning.   No current facility-administered medications for this visit. (Other)      REVIEW OF SYSTEMS:    ALLERGIES No Known Allergies  PAST MEDICAL HISTORY Past Medical History:  Diagnosis Date   Arthritis    Carpal tunnel syndrome of left wrist    Diabetes mellitus    not on insulin   Gallstone pancreatitis 02/2008   Heart murmur    Hypertension    Polio    as child    Past Surgical History:  Procedure Laterality Date   CHOLECYSTECTOMY     REVERSE SHOULDER ARTHROPLASTY Left 05/23/2014   Procedure: REVERSE SHOULDER ARTHROPLASTY;  Surgeon: Mable Paris, MD;  Location: [facility] OR;  Service: Orthopedics;  Laterality: Left;   REVERSE SHOULDER ARTHROPLASTY Right 12/11/2015   Procedure: REVERSE SHOULDER ARTHROPLASTY;  Surgeon: Jones Broom, MD;  Location: [facility] OR;  Service: Orthopedics;  Laterality: Right;  Right reverse total shoulder arthroplasty   STERIOD INJECTION Left 05/23/2014   Procedure: STEROID INJECTION THUMB;  Surgeon: Mable Paris, MD;  Location: [facility] OR;  Service: Orthopedics;  Laterality: Left;  Steroid injection left thumb    FAMILY HISTORY Family History  Problem Relation Age of Onset   Heart attack Mother        Died of MI, dec at 25  Cirrhosis Father        alcohol induced     SOCIAL HISTORY Social History   Tobacco Use   Smoking status: Former    Packs/day: 1.00    Years: 20.00    Pack years: 20.00    Types: Cigarettes    Quit date: 04/12/1968    Years since quitting: 52.8   Smokeless tobacco: Never  Substance Use Topics   Alcohol use: No    Alcohol/week: 0.0 standard drinks   Drug use: No         OPHTHALMIC EXAM:  Base Eye Exam     Visual Acuity (ETDRS)       Right Left   Dist cc 20/50 -1 20/30 -1   Dist ph cc 20/25 -1 NI    Correction: Glasses         Tonometry (Tonopen, 1:26 PM)       Right Left   Pressure 11 08         Pupils       Pupils Dark  Light Shape React APD   Right PERRL 3 2 Round Brisk None   Left PERRL 3 2 Round Brisk None         Visual Fields (Counting fingers)       Left Right    Full Full         Extraocular Movement       Right Left    Full Full         Neuro/Psych     Oriented x3: Yes   Mood/Affect: Normal         Dilation     Left eye: 1.0% Mydriacyl, 2.5% Phenylephrine @ 1:26 PM           Slit Lamp and Fundus Exam     External Exam       Right Left   External Normal Normal         Slit Lamp Exam       Right Left   Lids/Lashes Normal Normal   Conjunctiva/Sclera White and quiet White and quiet   Cornea Clear Clear   Anterior Chamber Deep and quiet Deep and quiet   Iris Round and reactive Round and reactive   Lens Clear Clear   Anterior Vitreous Normal Normal         Fundus Exam       Right Left   Posterior Vitreous  Vitreous hemorrhage, mostly over the posterior pole optic nerve macular region.  Now newly dispersed   Disc  Normal, no N/V   C/D Ratio  0.35   Macula  Normal   Vessels  Normal   Periphery  Normal, 25 diopter, 20 diopter, 90 diopter exam, scleral depressed, no holes or tears            IMAGING AND PROCEDURES  Imaging and Procedures for 01/20/21  OCT, Retina - OU - Both Eyes       Right Eye Quality was good. Scan locations included subfoveal. Central Foveal Thickness: 235. Progression has been stable. Findings include normal foveal contour.   Left Eye Scan locations included subfoveal. Central Foveal Thickness: 242. Progression has been stable. Findings include normal foveal contour.   Notes OD with clear media.  No active maculopathy  OS with moderate vitreous debris.  Centrally there is no significant macular thickening             ASSESSMENT/PLAN:  Vitreous hemorrhage of left eye (HCC) Dispersed debris, now more  symptomatic to the patient hampering activities of daily living  Severe nonproliferative diabetic  retinopathy of left eye without macular edema associated with type 2 diabetes mellitus (HCC) No signs of PDR OS     ICD-10-CM   1. Vitreous hemorrhage of left eye (HCC)  H43.12 OCT, Retina - OU - Both Eyes    2. Posterior vitreous detachment of left eye  H43.812 OCT, Retina - OU - Both Eyes    3. 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  Patient having vitreous debris and floaters significant cannot over the posterior pole hampering her activities of daily living and her judgment and these are worsened over the last several weeks.  She is asked that we proceed with vitrectomy to clear these areas but also to deliver peripheral PRP to the areas of severe NPDR and other signs of retinal nonperfusion of present OS left eye  2.  Risk and benefits will be reviewed with the patient preoperatively, and the typical surgical case of 15 to 20 minutes of surgical time reviewed.  3.  Ophthalmic Meds Ordered this visit:  No orders of the defined types were placed in this encounter.      Return ,, [facility], for Schedule 1-3 weeks, vitrectomy PRP, [id number].  There are no Patient Instructions on file for this visit.   Explained the diagnoses, plan, and follow up with the patient and they expressed understanding.  Patient expressed understanding of the importance of proper follow up care.   Alford Highland Allyah Heather M.D. Diseases & Surgery of the Retina and Vitreous [facility] 01/20/21     Abbreviations: M myopia (nearsighted); A astigmatism; H hyperopia (farsighted); P presbyopia; Mrx spectacle prescription;  CTL contact lenses; OD right eye; OS left eye; OU both eyes  XT exotropia; ET esotropia; PEK punctate epithelial keratitis; PEE punctate epithelial erosions; DES dry eye syndrome; MGD meibomian gland dysfunction; ATs artificial tears; PFAT's preservative free  artificial tears; NSC nuclear sclerotic cataract; PSC posterior subcapsular cataract; ERM epi-retinal membrane; PVD posterior vitreous detachment; RD retinal detachment; DM diabetes mellitus; DR diabetic retinopathy; NPDR non-proliferative diabetic retinopathy; PDR proliferative diabetic retinopathy; CSME clinically significant macular edema; DME diabetic macular edema; dbh dot blot hemorrhages; CWS cotton wool spot; POAG primary open angle glaucoma; C/D cup-to-disc ratio; HVF humphrey visual field; GVF goldmann visual field; OCT optical coherence tomography; IOP intraocular pressure; BRVO Branch retinal vein occlusion; CRVO central retinal vein occlusion; CRAO central retinal artery occlusion; BRAO branch retinal artery occlusion; RT retinal tear; SB scleral buckle; PPV pars plana vitrectomy; VH Vitreous hemorrhage; PRP panretinal laser photocoagulation; IVK intravitreal kenalog; VMT vitreomacular traction; MH Macular hole;  NVD neovascularization of the disc; NVE neovascularization elsewhere; AREDS age related eye disease study; ARMD age related macular degeneration; POAG primary open angle glaucoma; EBMD epithelial/anterior basement membrane dystrophy; ACIOL anterior chamber intraocular lens; IOL intraocular lens; PCIOL posterior chamber intraocular lens; Phaco/IOL phacoemulsification with intraocular lens placement; PRK photorefractive keratectomy; LASIK laser assisted in situ keratomileusis; HTN hypertension; DM diabetes mellitus; COPD chronic obstructive pulmonary disease

## 2021-01-20 NOTE — Assessment & Plan Note (Signed)
No signs of PDR OS

## 2021-02-02 ENCOUNTER — Encounter (INDEPENDENT_AMBULATORY_CARE_PROVIDER_SITE_OTHER): Payer: Medicare Other | Admitting: Ophthalmology

## 2021-02-04 ENCOUNTER — Ambulatory Visit (INDEPENDENT_AMBULATORY_CARE_PROVIDER_SITE_OTHER): Payer: Medicare Other

## 2021-02-04 ENCOUNTER — Other Ambulatory Visit: Payer: Self-pay

## 2021-02-04 DIAGNOSIS — E113492 Type 2 diabetes mellitus with severe nonproliferative diabetic retinopathy without macular edema, left eye: Secondary | ICD-10-CM

## 2021-02-04 DIAGNOSIS — H4312 Vitreous hemorrhage, left eye: Secondary | ICD-10-CM

## 2021-02-04 MED ORDER — PREDNISOLONE ACETATE 1 % OP SUSP: 1.0000 [drp] | Freq: Four times a day (QID) | OPHTHALMIC | 0 refills | Status: AC

## 2021-02-04 MED ORDER — OFLOXACIN 0.3 % OP SOLN: 1.0000 [drp] | Freq: Four times a day (QID) | OPHTHALMIC | 0 refills | Status: AC

## 2021-02-04 NOTE — Progress Notes (Signed)
02/04/2021     CHIEF COMPLAINT Patient presents for Pre-op Exam   HISTORY OF PRESENT ILLNESS: Jenna Murphy is a 87 y.o. female who presents to the clinic today for:   HPI   Pre Op OS sx 02/11/2021. Patient states vision is stable and unchanged since last visit. Denies any new floaters or FOL.  Last edited by Nelva Nay on 02/04/2021  1:57 PM.        HISTORICAL INFORMATION:   Selected notes from the MEDICAL RECORD NUMBER    Lab Results  Component Value Date   HGBA1C 7.6 (H) 02/09/2020     CURRENT MEDICATIONS: Current Outpatient Medications (Ophthalmic Drugs)  Medication Sig   ofloxacin (OCUFLOX) 0.3 % ophthalmic solution Place 1 drop into the left eye in the morning, at noon, in the evening, and at bedtime for 21 doses.   prednisoLONE acetate (PRED FORTE) 1 % ophthalmic suspension Place 1 drop into the left eye in the morning, at noon, in the evening, and at bedtime for 21 doses.   No current facility-administered medications for this visit. (Ophthalmic Drugs)   Current Outpatient Medications (Other)  Medication Sig   atorvastatin (LIPITOR) 20 MG tablet Take 1 tablet (20 mg total) by mouth daily.   cholecalciferol (VITAMIN D3) 25 MCG (1000 UNIT) tablet Take 1,000 Units by mouth daily.   isosorbide-hydrALAZINE (BIDIL) 20-37.5 MG tablet Take 1 tablet by mouth 3 (three) times daily.   metFORMIN (GLUCOPHAGE) 500 MG tablet Take 1 tablet (500 mg total) by mouth 2 (two) times daily with a meal. Resume metformin on 02/09/2020   metoprolol tartrate (LOPRESSOR) 50 MG tablet Take 1.5 tablets (75 mg total) by mouth 2 (two) times daily.   spironolactone (ALDACTONE) 25 MG tablet Take 1 tablet (25 mg total) by mouth every morning.   No current facility-administered medications for this visit. (Other)     ALLERGIES No Known Allergies  PAST MEDICAL HISTORY Past Medical History:  Diagnosis Date   Arthritis    Carpal tunnel syndrome of left wrist    Diabetes  mellitus    not on insulin   Gallstone pancreatitis 02/2008   Heart murmur    Hypertension    Polio    as child    Past Surgical History:  Procedure Laterality Date   CHOLECYSTECTOMY     REVERSE SHOULDER ARTHROPLASTY Left 05/23/2014   Procedure: REVERSE SHOULDER ARTHROPLASTY;  Surgeon: Mable Paris, MD;  Location: [facility] OR;  Service: Orthopedics;  Laterality: Left;   REVERSE SHOULDER ARTHROPLASTY Right 12/11/2015   Procedure: REVERSE SHOULDER ARTHROPLASTY;  Surgeon: Jones Broom, MD;  Location: [facility] OR;  Service: Orthopedics;  Laterality: Right;  Right reverse total shoulder arthroplasty   STERIOD INJECTION Left 05/23/2014   Procedure: STEROID INJECTION THUMB;  Surgeon: Mable Paris, MD;  Location: [facility] OR;  Service: Orthopedics;  Laterality: Left;  Steroid injection left thumb    FAMILY HISTORY Family History  Problem Relation Age of Onset   Heart attack Mother        Died of MI, dec at 39    Cirrhosis Father        alcohol induced     SOCIAL HISTORY Social History   Tobacco Use   Smoking status: Former    Packs/day: 1.00    Years: 20.00    Pack years: 20.00    Types: Cigarettes    Quit date: 04/12/1968    Years since quitting: 52.8   Smokeless tobacco: Never  Substance Use  Topics   Alcohol use: No    Alcohol/week: 0.0 standard drinks   Drug use: No         OPHTHALMIC EXAM:  Base Eye Exam     Visual Acuity (ETDRS)       Right Left   Dist cc 20/50 20/40 -1   Dist ph cc 20/25 -1 20/25    Correction: Glasses         Tonometry     Tonopen         Pupils       Pupils APD   Right PERRL None   Left PERRL None         Dilation     Both eyes: No dilation             IMAGING AND PROCEDURES  Imaging and Procedures for @TODAY @           ASSESSMENT/PLAN:    ICD-10-CM   1. Vitreous hemorrhage of left eye (HCC)  H43.12     2. 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    Dispense:  5 mL    Refill:  0   ofloxacin (OCUFLOX) 0.3 % ophthalmic solution    Sig: Place 1 drop into the left eye in the morning, at noon, in the evening, and at bedtime for 21 doses.    Dispense:  5 mL    Refill:  0         Pre-op completed. Operative consent obtained with pre-op eye drops reviewed with Y40.3474 and sent via [facility] as needed. Post op instructions reviewed with patient and per patient all questions answered.  [facility]

## 2021-02-11 ENCOUNTER — Encounter (AMBULATORY_SURGERY_CENTER): Payer: Medicare Other | Admitting: Ophthalmology

## 2021-02-11 DIAGNOSIS — H4312 Vitreous hemorrhage, left eye: Secondary | ICD-10-CM

## 2021-02-11 DIAGNOSIS — E113592 Type 2 diabetes mellitus with proliferative diabetic retinopathy without macular edema, left eye: Secondary | ICD-10-CM | POA: Diagnosis not present

## 2021-02-12 ENCOUNTER — Encounter (INDEPENDENT_AMBULATORY_CARE_PROVIDER_SITE_OTHER): Payer: Self-pay | Admitting: Ophthalmology

## 2021-02-12 ENCOUNTER — Ambulatory Visit (INDEPENDENT_AMBULATORY_CARE_PROVIDER_SITE_OTHER): Payer: Medicare Other | Admitting: Ophthalmology

## 2021-02-12 ENCOUNTER — Other Ambulatory Visit: Payer: Self-pay

## 2021-02-12 DIAGNOSIS — H4312 Vitreous hemorrhage, left eye: Secondary | ICD-10-CM

## 2021-02-12 DIAGNOSIS — E113592 Type 2 diabetes mellitus with proliferative diabetic retinopathy without macular edema, left eye: Secondary | ICD-10-CM

## 2021-02-12 NOTE — Progress Notes (Signed)
02/12/2021     CHIEF COMPLAINT Patient presents for  Chief Complaint  Patient presents with   Post-op Follow-up      HISTORY OF PRESENT ILLNESS: Jenna Murphy is a 87 y.o. female who presents to the clinic today for:   HPI     Post-op Follow-up   In left eye.  Discomfort includes pain (Pt states "Soreness and pain last night, between 1 and 2, I just knew it was there.") and floaters.  Vision is stable.        Comments   1 Day Post op OS sx 02/11/2021. Pt complains of slight pain. Pt states she does not really notice any changes. Pt denies new FOL, mentioned a floater when I took the patch off. Pt is starting back on prednisolone and ofloxacin OS qid.      Last edited by Nelva Nay on 02/12/2021  8:44 AM.      Referring physician: Andi Devon, MD [address] [place],  [place] [address]  HISTORICAL INFORMATION:   Selected notes from the MEDICAL RECORD NUMBER    Lab Results  Component Value Date   HGBA1C 7.6 (H) 02/09/2020     CURRENT MEDICATIONS: No current outpatient medications on file. (Ophthalmic Drugs)   No current facility-administered medications for this visit. (Ophthalmic Drugs)   Current Outpatient Medications (Other)  Medication Sig   atorvastatin (LIPITOR) 20 MG tablet Take 1 tablet (20 mg total) by mouth daily.   cholecalciferol (VITAMIN D3) 25 MCG (1000 UNIT) tablet Take 1,000 Units by mouth daily.   isosorbide-hydrALAZINE (BIDIL) 20-37.5 MG tablet Take 1 tablet by mouth 3 (three) times daily.   metFORMIN (GLUCOPHAGE) 500 MG tablet Take 1 tablet (500 mg total) by mouth 2 (two) times daily with a meal. Resume metformin on 02/09/2020   metoprolol tartrate (LOPRESSOR) 50 MG tablet Take 1.5 tablets (75 mg total) by mouth 2 (two) times daily.   spironolactone (ALDACTONE) 25 MG tablet Take 1 tablet (25 mg total) by mouth every morning.   No current facility-administered medications for this visit. (Other)      REVIEW  OF SYSTEMS:    ALLERGIES No Known Allergies  PAST MEDICAL HISTORY Past Medical History:  Diagnosis Date   Arthritis    Carpal tunnel syndrome of left wrist    Diabetes mellitus    not on insulin   Gallstone pancreatitis 02/2008   Heart murmur    Hypertension    Polio    as child    Past Surgical History:  Procedure Laterality Date   CHOLECYSTECTOMY     REVERSE SHOULDER ARTHROPLASTY Left 05/23/2014   Procedure: REVERSE SHOULDER ARTHROPLASTY;  Surgeon: Mable Paris, MD;  Location: [facility] OR;  Service: Orthopedics;  Laterality: Left;   REVERSE SHOULDER ARTHROPLASTY Right 12/11/2015   Procedure: REVERSE SHOULDER ARTHROPLASTY;  Surgeon: Jones Broom, MD;  Location: [facility] OR;  Service: Orthopedics;  Laterality: Right;  Right reverse total shoulder arthroplasty   STERIOD INJECTION Left 05/23/2014   Procedure: STEROID INJECTION THUMB;  Surgeon: Mable Paris, MD;  Location: [facility] OR;  Service: Orthopedics;  Laterality: Left;  Steroid injection left thumb    FAMILY HISTORY Family History  Problem Relation Age of Onset   Heart attack Mother        Died of MI, dec at 12    Cirrhosis Father        alcohol induced     SOCIAL HISTORY Social History   Tobacco Use  Smoking status: Former    Packs/day: 1.00    Years: 20.00    Pack years: 20.00    Types: Cigarettes    Quit date: 04/12/1968    Years since quitting: 52.8   Smokeless tobacco: Never  Substance Use Topics   Alcohol use: No    Alcohol/week: 0.0 standard drinks   Drug use: No         OPHTHALMIC EXAM:  Base Eye Exam     Visual Acuity (ETDRS)       Right Left   Dist [place] 20/20 20/30 -2         Tonometry (Tonopen, 8:48 AM)       Right Left   Pressure 15 6         Pupils       Pupils Dark Light React APD   Right PERRL 3 2  None   Left PERRL [employer]. Dilated  Minimal None         Extraocular Movement       Right Left    Full Full         Neuro/Psych     Oriented x3: Yes    Mood/Affect: Normal         Dilation     Left eye: 1.0% Mydriacyl, 2.5% Phenylephrine @ 8:48 AM           Slit Lamp and Fundus Exam     External Exam       Right Left   External Normal Normal         Slit Lamp Exam       Right Left   Lids/Lashes Normal Normal   Conjunctiva/Sclera White and quiet White and quiet   Cornea Clear Clear   Anterior Chamber Deep and quiet Deep and quiet   Iris Round and reactive Round and reactive   Lens Clear Clear   Anterior Vitreous Normal Normal         Fundus Exam       Right Left   Posterior Vitreous  Clear, avitric   Disc  Normal, no N/V   C/D Ratio  0.35   Macula  Normal   Vessels  Normal   Periphery  Good peripheral anterior PRP            IMAGING AND PROCEDURES  Imaging and Procedures for 02/12/21           ASSESSMENT/PLAN:  Vitreous hemorrhage of left eye (HCC) OS clear visit, looks great postoperative day #1 with excellent visual acuity  Proliferative diabetic retinopathy, left eye (HCC) Postop day #1 vitrectomy PRP looks great with less active PDR     ICD-10-CM   1. Vitreous hemorrhage of left eye (HCC)  H43.12     2. Proliferative diabetic retinopathy of left eye associated with type 2 diabetes mellitus, unspecified proliferative retinopathy type (HCC)  T06.2694       1.  Postop day #1 vitrectomy PRP for vitreous hemorrhage and PDR, previously treatment nave  2.  3.  Ophthalmic Meds Ordered this visit:  No orders of the defined types were placed in this encounter.      Return in about 1 week (around 02/19/2021) for dilate, POST OP, OCT.  Patient Instructions  Ofloxacin  4 times daily to the operative eye  Prednisolone acetate 1 drop to the operative eye 4 times daily  Patient instructed not to refill the medications and use them for maximum of 3 weeks.  Patient instructed do not rub  the eye.  Patient has the option to use the patch at night.    Explained the diagnoses, plan,  and follow up with the patient and they expressed understanding.  Patient expressed understanding of the importance of proper follow up care.   Alford Highland Simara Rhyner M.D. Diseases & Surgery of the Retina and Vitreous [facility] 02/12/21     Abbreviations: M myopia (nearsighted); A astigmatism; H hyperopia (farsighted); P presbyopia; Mrx spectacle prescription;  CTL contact lenses; OD right eye; OS left eye; OU both eyes  XT exotropia; ET esotropia; PEK punctate epithelial keratitis; PEE punctate epithelial erosions; DES dry eye syndrome; MGD meibomian gland dysfunction; ATs artificial tears; PFAT's preservative free artificial tears; NSC nuclear sclerotic cataract; PSC posterior subcapsular cataract; ERM epi-retinal membrane; PVD posterior vitreous detachment; RD retinal detachment; DM diabetes mellitus; DR diabetic retinopathy; NPDR non-proliferative diabetic retinopathy; PDR proliferative diabetic retinopathy; CSME clinically significant macular edema; DME diabetic macular edema; dbh dot blot hemorrhages; CWS cotton wool spot; POAG primary open angle glaucoma; C/D cup-to-disc ratio; HVF humphrey visual field; GVF goldmann visual field; OCT optical coherence tomography; IOP intraocular pressure; BRVO Branch retinal vein occlusion; CRVO central retinal vein occlusion; CRAO central retinal artery occlusion; BRAO branch retinal artery occlusion; RT retinal tear; SB scleral buckle; PPV pars plana vitrectomy; VH Vitreous hemorrhage; PRP panretinal laser photocoagulation; IVK intravitreal kenalog; VMT vitreomacular traction; MH Macular hole;  NVD neovascularization of the disc; NVE neovascularization elsewhere; AREDS age related eye disease study; ARMD age related macular degeneration; POAG primary open angle glaucoma; EBMD epithelial/anterior basement membrane dystrophy; ACIOL anterior chamber intraocular lens; IOL intraocular lens; PCIOL posterior chamber intraocular lens; Phaco/IOL  phacoemulsification with intraocular lens placement; PRK photorefractive keratectomy; LASIK laser assisted in situ keratomileusis; HTN hypertension; DM diabetes mellitus; COPD chronic obstructive pulmonary disease

## 2021-02-12 NOTE — Patient Instructions (Signed)
Ofloxacin  4 times daily to the operative eye  Prednisolone acetate 1 drop to the operative eye 4 times daily  Patient instructed not to refill the medications and use them for maximum of 3 weeks.  Patient instructed do not rub the eye.  Patient has the option to use the patch at night. 

## 2021-02-12 NOTE — Assessment & Plan Note (Signed)
Postop day #1 vitrectomy PRP looks great with less active PDR

## 2021-02-12 NOTE — Assessment & Plan Note (Signed)
OS clear visit, looks great postoperative day #1 with excellent visual acuity

## 2021-02-13 ENCOUNTER — Other Ambulatory Visit: Payer: Self-pay | Admitting: Cardiology

## 2021-02-13 DIAGNOSIS — I10 Essential (primary) hypertension: Secondary | ICD-10-CM

## 2021-02-18 ENCOUNTER — Other Ambulatory Visit: Payer: Self-pay

## 2021-02-18 ENCOUNTER — Ambulatory Visit (INDEPENDENT_AMBULATORY_CARE_PROVIDER_SITE_OTHER): Payer: Medicare Other | Admitting: Ophthalmology

## 2021-02-18 ENCOUNTER — Encounter (INDEPENDENT_AMBULATORY_CARE_PROVIDER_SITE_OTHER): Payer: Self-pay | Admitting: Ophthalmology

## 2021-02-18 DIAGNOSIS — E113592 Type 2 diabetes mellitus with proliferative diabetic retinopathy without macular edema, left eye: Secondary | ICD-10-CM

## 2021-02-18 DIAGNOSIS — Z9889 Other specified postprocedural states: Secondary | ICD-10-CM

## 2021-02-18 DIAGNOSIS — H4312 Vitreous hemorrhage, left eye: Secondary | ICD-10-CM

## 2021-02-18 NOTE — Assessment & Plan Note (Signed)
OS looks great. ?

## 2021-02-18 NOTE — Patient Instructions (Signed)
Ofloxacin  4 times daily to the operative eye  Prednisolone acetate 1 drop to the operative eye 4 times daily  Patient instructed not to refill the medications and use them for maximum of 3 weeks.  Patient instructed do not rub the eye.  Patient has the option to use the patch at night.   Patient instructed to discontinue the drops in 2 weeks or sooner if the current bottles are completed  Specifically do not refill the medication

## 2021-02-18 NOTE — Assessment & Plan Note (Signed)
1 week postop looks great, clear visual axis improved acuity

## 2021-02-18 NOTE — Assessment & Plan Note (Signed)
Condition stable post PRP time of vitrectomy

## 2021-02-18 NOTE — Progress Notes (Signed)
02/18/2021     CHIEF COMPLAINT Patient presents for  Chief Complaint  Patient presents with   Post-op Follow-up      HISTORY OF PRESENT ILLNESS: Jenna Murphy is a 87 y.o. female who presents to the clinic today for:   HPI     Post-op Follow-up   In left eye.  Discomfort includes Negative for pain (Pt states "Soreness and pain last night, between 1 and 2, I just knew it was there."), itching, foreign body sensation, tearing and floaters.  Vision is improved.        Comments   1 week PO OS oct sx 11/2/822. Pt states she experienced some soreness within the past week, but it is better now. Pt is using ofloxacin and prednisolone qid OS. Pt states "I fight with these drops." Pt states "I would say so" to me asking about improvement in her vision.       Last edited by Nelva Nay on 02/18/2021  8:27 AM.      Referring physician: Ernesto Rutherford, MD [address] [place],  [place] [address]  HISTORICAL INFORMATION:   Selected notes from the MEDICAL RECORD NUMBER    Lab Results  Component Value Date   HGBA1C 7.6 (H) 02/09/2020     CURRENT MEDICATIONS: No current outpatient medications on file. (Ophthalmic Drugs)   No current facility-administered medications for this visit. (Ophthalmic Drugs)   Current Outpatient Medications (Other)  Medication Sig   atorvastatin (LIPITOR) 20 MG tablet Take 1 tablet (20 mg total) by mouth daily.   cholecalciferol (VITAMIN D3) 25 MCG (1000 UNIT) tablet Take 1,000 Units by mouth daily.   isosorbide-hydrALAZINE (BIDIL) 20-37.5 MG tablet TAKE 1 TABLET BY MOUTH THREE TIMES DAILY   metFORMIN (GLUCOPHAGE) 500 MG tablet Take 1 tablet (500 mg total) by mouth 2 (two) times daily with a meal. Resume metformin on 02/09/2020   metoprolol tartrate (LOPRESSOR) 50 MG tablet Take 1.5 tablets (75 mg total) by mouth 2 (two) times daily.   spironolactone (ALDACTONE) 25 MG tablet Take 1 tablet (25 mg total) by mouth every morning.   No  current facility-administered medications for this visit. (Other)      REVIEW OF SYSTEMS:    ALLERGIES No Known Allergies  PAST MEDICAL HISTORY Past Medical History:  Diagnosis Date   Arthritis    Carpal tunnel syndrome of left wrist    Diabetes mellitus    not on insulin   Gallstone pancreatitis 02/2008   Heart murmur    Hypertension    Polio    as child    Past Surgical History:  Procedure Laterality Date   CHOLECYSTECTOMY     REVERSE SHOULDER ARTHROPLASTY Left 05/23/2014   Procedure: REVERSE SHOULDER ARTHROPLASTY;  Surgeon: Mable Paris, MD;  Location: [facility] OR;  Service: Orthopedics;  Laterality: Left;   REVERSE SHOULDER ARTHROPLASTY Right 12/11/2015   Procedure: REVERSE SHOULDER ARTHROPLASTY;  Surgeon: Jones Broom, MD;  Location: [facility] OR;  Service: Orthopedics;  Laterality: Right;  Right reverse total shoulder arthroplasty   STERIOD INJECTION Left 05/23/2014   Procedure: STEROID INJECTION THUMB;  Surgeon: Mable Paris, MD;  Location: [facility] OR;  Service: Orthopedics;  Laterality: Left;  Steroid injection left thumb    FAMILY HISTORY Family History  Problem Relation Age of Onset   Heart attack Mother        Died of MI, dec at 35    Cirrhosis Father        alcohol  induced     SOCIAL HISTORY Social History   Tobacco Use   Smoking status: Former    Packs/day: 1.00    Years: 20.00    Pack years: 20.00    Types: Cigarettes    Quit date: 04/12/1968    Years since quitting: 52.8   Smokeless tobacco: Never  Substance Use Topics   Alcohol use: No    Alcohol/week: 0.0 standard drinks   Drug use: No         OPHTHALMIC EXAM:  Base Eye Exam     Visual Acuity (ETDRS)       Right Left   Dist [place] 20/20 20/25 -2         Tonometry (Tonopen, 8:30 AM)       Right Left   Pressure 12 7         Pupils       Pupils Dark Light APD   Right PERRL 3 2 None   Left PERRL 3 2 None         Extraocular Movement       Right Left    Full  Full         Neuro/Psych     Oriented x3: Yes   Mood/Affect: Normal         Dilation     Left eye: 1.0% Mydriacyl, 2.5% Phenylephrine @ 8:30 AM           Slit Lamp and Fundus Exam     External Exam       Right Left   External Normal Normal         Slit Lamp Exam       Right Left   Lids/Lashes Normal Normal   Conjunctiva/Sclera White and quiet White and quiet   Cornea Clear Clear   Anterior Chamber Deep and quiet Deep and quiet   Iris Round and reactive Round and reactive   Lens Clear Clear   Anterior Vitreous Normal Clear avitric         Fundus Exam       Right Left   Posterior Vitreous  Clear, avitric   Disc  Normal, no N/V   C/D Ratio  0.35   Macula  Normal   Vessels  Normal   Periphery  Good peripheral anterior PRP            IMAGING AND PROCEDURES  Imaging and Procedures for 02/18/21  OCT, Retina - OU - Both Eyes       Right Eye Quality was good. Scan locations included subfoveal. Central Foveal Thickness: 235. Progression has been stable. Findings include normal foveal contour.   Left Eye Scan locations included subfoveal. Central Foveal Thickness: 245. Progression has been stable. Findings include normal foveal contour.   Notes OD with clear media.  No active maculopathy  Clear vitreous OS, no vitreous debris normal macula             ASSESSMENT/PLAN:  Vitreous hemorrhage of left eye (HCC) 1 week postop looks great, clear visual axis improved acuity  History of vitrectomy OS looks great  Proliferative diabetic retinopathy, left eye (HCC) Condition stable post PRP time of vitrectomy     ICD-10-CM   1. Vitreous hemorrhage of left eye (HCC)  H43.12 OCT, Retina - OU - Both Eyes    2. History of vitrectomy  Z98.890     3. Proliferative diabetic retinopathy of left eye associated with type 2 diabetes mellitus, macular edema presence unspecified (HCC)  E26.8341       1.  OS looks great, recovered visual acuity  vitreous hemorrhages clear  2.  3.  Ophthalmic Meds Ordered this visit:  No orders of the defined types were placed in this encounter.      Return in about 3 months (around 05/21/2021) for DILATE OU, COLOR FP, OCT.  Patient Instructions  Ofloxacin  4 times daily to the operative eye  Prednisolone acetate 1 drop to the operative eye 4 times daily  Patient instructed not to refill the medications and use them for maximum of 3 weeks.  Patient instructed do not rub the eye.  Patient has the option to use the patch at night.   Patient instructed to discontinue the drops in 2 weeks or sooner if the current bottles are completed  Specifically do not refill the medication  Explained the diagnoses, plan, and follow up with the patient and they expressed understanding.  Patient expressed understanding of the importance of proper follow up care.   Alford Highland Zuzu Befort M.D. Diseases & Surgery of the Retina and Vitreous [facility] 02/18/21     Abbreviations: M myopia (nearsighted); A astigmatism; H hyperopia (farsighted); P presbyopia; Mrx spectacle prescription;  CTL contact lenses; OD right eye; OS left eye; OU both eyes  XT exotropia; ET esotropia; PEK punctate epithelial keratitis; PEE punctate epithelial erosions; DES dry eye syndrome; MGD meibomian gland dysfunction; ATs artificial tears; PFAT's preservative free artificial tears; NSC nuclear sclerotic cataract; PSC posterior subcapsular cataract; ERM epi-retinal membrane; PVD posterior vitreous detachment; RD retinal detachment; DM diabetes mellitus; DR diabetic retinopathy; NPDR non-proliferative diabetic retinopathy; PDR proliferative diabetic retinopathy; CSME clinically significant macular edema; DME diabetic macular edema; dbh dot blot hemorrhages; CWS cotton wool spot; POAG primary open angle glaucoma; C/D cup-to-disc ratio; HVF humphrey visual field; GVF goldmann visual field; OCT optical coherence tomography; IOP  intraocular pressure; BRVO Branch retinal vein occlusion; CRVO central retinal vein occlusion; CRAO central retinal artery occlusion; BRAO branch retinal artery occlusion; RT retinal tear; SB scleral buckle; PPV pars plana vitrectomy; VH Vitreous hemorrhage; PRP panretinal laser photocoagulation; IVK intravitreal kenalog; VMT vitreomacular traction; MH Macular hole;  NVD neovascularization of the disc; NVE neovascularization elsewhere; AREDS age related eye disease study; ARMD age related macular degeneration; POAG primary open angle glaucoma; EBMD epithelial/anterior basement membrane dystrophy; ACIOL anterior chamber intraocular lens; IOL intraocular lens; PCIOL posterior chamber intraocular lens; Phaco/IOL phacoemulsification with intraocular lens placement; PRK photorefractive keratectomy; LASIK laser assisted in situ keratomileusis; HTN hypertension; DM diabetes mellitus; COPD chronic obstructive pulmonary disease

## 2021-03-02 ENCOUNTER — Emergency Department (HOSPITAL_COMMUNITY)
Admission: EM | Admit: 2021-03-02 | Discharge: 2021-03-03 | Disposition: A | Discharge status: Home / Self Care | Payer: Medicare Other | Attending: Emergency Medicine | Admitting: Emergency Medicine

## 2021-03-02 ENCOUNTER — Encounter (HOSPITAL_COMMUNITY): Payer: Self-pay | Admitting: Emergency Medicine

## 2021-03-02 ENCOUNTER — Other Ambulatory Visit: Payer: Self-pay

## 2021-03-02 DIAGNOSIS — I509 Heart failure, unspecified: Secondary | ICD-10-CM | POA: Insufficient documentation

## 2021-03-02 DIAGNOSIS — Z87891 Personal history of nicotine dependence: Secondary | ICD-10-CM | POA: Insufficient documentation

## 2021-03-02 DIAGNOSIS — E1122 Type 2 diabetes mellitus with diabetic chronic kidney disease: Secondary | ICD-10-CM | POA: Diagnosis not present

## 2021-03-02 DIAGNOSIS — Z7984 Long term (current) use of oral hypoglycemic drugs: Secondary | ICD-10-CM | POA: Insufficient documentation

## 2021-03-02 DIAGNOSIS — I130 Hypertensive heart and chronic kidney disease with heart failure and stage 1 through stage 4 chronic kidney disease, or unspecified chronic kidney disease: Secondary | ICD-10-CM | POA: Diagnosis not present

## 2021-03-02 DIAGNOSIS — N1831 Chronic kidney disease, stage 3a: Secondary | ICD-10-CM | POA: Insufficient documentation

## 2021-03-02 DIAGNOSIS — R109 Unspecified abdominal pain: Secondary | ICD-10-CM | POA: Insufficient documentation

## 2021-03-02 DIAGNOSIS — Z79899 Other long term (current) drug therapy: Secondary | ICD-10-CM | POA: Diagnosis not present

## 2021-03-02 DIAGNOSIS — S301XXA Contusion of abdominal wall, initial encounter: Secondary | ICD-10-CM

## 2021-03-02 DIAGNOSIS — I10 Essential (primary) hypertension: Secondary | ICD-10-CM

## 2021-03-02 MED ORDER — OXYCODONE-ACETAMINOPHEN 5-325 MG PO TABS
1.0000 | ORAL_TABLET | Freq: Once | ORAL | Status: AC
  Administered 2021-03-02: 1 via ORAL
  Filled 2021-03-02: qty 1

## 2021-03-02 NOTE — ED Provider Notes (Signed)
Emergency Medicine Provider Triage Evaluation Note  Jenna Murphy , a 87 y.o. female  was evaluated in triage.  Pt complains of flank pain x 3 days. Patient reports that she is having pain to the right flank/mid back since an injury 3 days prior. Reports she signed and leaned back not realizing how far from the door she was and fell back striking her back on the door knob. Mild pain at time of injury but has been intermittent since, and worsened tonight. Also had 1 episode of emesis this evening.   Review of Systems  Positive: Back pain, emesis Negative: Melena, hematuria, chest pain, headache.   Physical Exam  BP (!) 180/88   Pulse 69   Temp 98.6 F (37 C)   Resp 18   SpO2 96%  Gen:   Awake, no distress   Resp:  Normal effort  MSK:   Moves extremities without difficulty  Other:  Right lower thoracic paraspinal muscle TTP as well as right CVA tenderness. Mild lateral RUQ tenderness. No chest wall TTP.   Medical Decision Making  Medically screening exam initiated at 11:47 PM.  Appropriate orders placed.  Jenna Murphy was informed that the remainder of the evaluation will be completed by another provider, this initial triage assessment does not replace that evaluation, and the importance of remaining in the ED until their evaluation is complete.  Labs & CT ordered.  Back pain.    Desmond Lope 03/03/21 0004    Glynn Octave, MD 03/03/21 0110

## 2021-03-02 NOTE — ED Triage Notes (Addendum)
Patient arrived with EMS from home reports pain at right upper flank onset last Saturday when she accidentally hit it against a door knob. Hypertensive at triage .

## 2021-03-03 ENCOUNTER — Telehealth: Payer: Self-pay | Admitting: Cardiology

## 2021-03-03 ENCOUNTER — Emergency Department (HOSPITAL_COMMUNITY): Payer: Medicare Other

## 2021-03-03 ENCOUNTER — Encounter (HOSPITAL_COMMUNITY): Payer: Self-pay

## 2021-03-03 LAB — CBC WITH DIFFERENTIAL/PLATELET
Abs Immature Granulocytes: 0.02 10*3/uL (ref 0.00–0.07)
Basophils Absolute: 0.1 10*3/uL (ref 0.0–0.1)
Basophils Relative: 1 %
Eosinophils Absolute: 0.2 10*3/uL (ref 0.0–0.5)
Eosinophils Relative: 2 %
HCT: 34.2 % — ABNORMAL LOW (ref 36.0–46.0)
Hemoglobin: 11.2 g/dL — ABNORMAL LOW (ref 12.0–15.0)
Immature Granulocytes: 0 %
Lymphocytes Relative: 66 %
Lymphs Abs: 6.6 10*3/uL — ABNORMAL HIGH (ref 0.7–4.0)
MCH: 26.3 pg (ref 26.0–34.0)
MCHC: 32.7 g/dL (ref 30.0–36.0)
MCV: 80.3 fL (ref 80.0–100.0)
Monocytes Absolute: 0.7 10*3/uL (ref 0.1–1.0)
Monocytes Relative: 7 %
Neutro Abs: 2.4 10*3/uL (ref 1.7–7.7)
Neutrophils Relative %: 24 %
Platelets: 296 10*3/uL (ref 150–400)
RBC: 4.26 MIL/uL (ref 3.87–5.11)
RDW: 15.3 % (ref 11.5–15.5)
WBC: 9.9 10*3/uL (ref 4.0–10.5)
nRBC: 0 % (ref 0.0–0.2)

## 2021-03-03 LAB — COMPREHENSIVE METABOLIC PANEL
ALT: 19 U/L (ref 0–44)
AST: 27 U/L (ref 15–41)
Albumin: 3.8 g/dL (ref 3.5–5.0)
Alkaline Phosphatase: 88 U/L (ref 38–126)
Anion gap: 10 (ref 5–15)
BUN: 19 mg/dL (ref 8–23)
CO2: 22 mmol/L (ref 22–32)
Calcium: 9.6 mg/dL (ref 8.9–10.3)
Chloride: 107 mmol/L (ref 98–111)
Creatinine, Ser: 1.49 mg/dL — ABNORMAL HIGH (ref 0.44–1.00)
GFR, Estimated: 33 mL/min — ABNORMAL LOW (ref >60)
Glucose, Bld: 125 mg/dL — ABNORMAL HIGH (ref 70–99)
Potassium: 4.2 mmol/L (ref 3.5–5.1)
Sodium: 139 mmol/L (ref 135–145)
Total Bilirubin: 0.8 mg/dL (ref 0.3–1.2)
Total Protein: 7.5 g/dL (ref 6.5–8.1)

## 2021-03-03 MED ORDER — IOHEXOL 300 MG/ML  SOLN
80.0000 mL | Freq: Once | INTRAMUSCULAR | Status: AC | PRN
  Administered 2021-03-03: 80 mL via INTRAVENOUS

## 2021-03-03 MED ORDER — ACETAMINOPHEN 500 MG PO TABS
1000.0000 mg | ORAL_TABLET | Freq: Once | ORAL | Status: AC
  Administered 2021-03-03: 1000 mg via ORAL
  Filled 2021-03-03: qty 2

## 2021-03-03 MED ORDER — METHOCARBAMOL 500 MG PO TABS
500.0000 mg | ORAL_TABLET | Freq: Once | ORAL | Status: AC
  Administered 2021-03-03: 500 mg via ORAL
  Filled 2021-03-03: qty 1

## 2021-03-03 MED ORDER — METHOCARBAMOL 500 MG PO TABS: 750.0000 mg | ORAL_TABLET | Freq: Three times a day (TID) | ORAL | 0 refills | Status: AC | PRN

## 2021-03-03 NOTE — ED Notes (Signed)
Patient transported to CT 

## 2021-03-03 NOTE — ED Notes (Signed)
Discharge instructions reviewed with patient. Patient verbalized understanding of instructions. Follow-up care and medications were reviewed. Patient ambulatory with steady gait. VSS upon discharge.  ?

## 2021-03-03 NOTE — Discharge Instructions (Addendum)
It was our pleasure to provide your ER care today - we hope that you feel better.  Take acetaminophen as need for pain. You may also take robaxin as need, as prescribed, for muscle pain/spasm - no driving when taking.   Continue your blood pressure meds, and follow up with primary care doctor regarding your blood pressure, that is high today.   Return to ER if worse, new symptoms, fevers, trouble breathing, new, worsening or severe pain, or other concern.

## 2021-03-03 NOTE — ED Provider Notes (Signed)
[facility] EMERGENCY DEPARTMENT Provider Note   CSN: [id number] Arrival date & time: 03/02/21  2319     History Chief Complaint  Patient presents with   Flank Pain    Jenna Murphy is a 87 y.o. female.  Patient c/o pain to right flank for past 2-3 days. States symptoms acute onset, when fell back into door knob. Pain acute onset, mod-severe, constant, dull, non radiating, worse w certain movement and positional changes. Denies other pain or injury. No faintness or dizziness prior to falling back. No head injury, loc or headache. No neck or midline/spine/back pain. No extremity pain or injury. Skin intact. No bruising, swelling, redness, or skin lesions/rash in area of pain. No nv. No fever or chills. No anticoag use.   The history is provided by the patient and medical records.  Flank Pain Pertinent negatives include no chest pain, no abdominal pain, no headaches and no shortness of breath.      Past Medical History:  Diagnosis Date   Arthritis    Carpal tunnel syndrome of left wrist    Diabetes mellitus    not on insulin   Gallstone pancreatitis 02/2008   Heart murmur    Hypertension    Polio    as child     Patient Active Problem List   Diagnosis Date Noted   History of vitrectomy 02/18/2021   Proliferative diabetic retinopathy, left eye (HCC) 01/08/2021   Vitreous hemorrhage of left eye (HCC) 01/08/2021   Posterior vitreous detachment of left eye 01/08/2021   PSVT (paroxysmal supraventricular tachycardia) (HCC) 04/16/2020   Stokes-Adams syncope 03/15/2020   Lumbar transverse process fracture (HCC) 02/09/2020   Fall 02/09/2020   DOE (dyspnea on exertion) 02/06/2020   Acute heart failure with preserved ejection fraction (HFpEF) (HCC)    AKI (acute kidney injury) (HCC)    Type 2 diabetes mellitus with hyperlipidemia (HCC)    Type 2 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)    CHF (congestive heart failure) (HCC) 02/04/2020    Palpitations 05/28/2019   Chronic heart failure with preserved ejection fraction (HCC) 05/28/2019   Exertional dyspnea 05/27/2019   PAD (peripheral artery disease) (HCC) 09/25/2018   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/23/2014   Chest pain 05/22/2011   Essential hypertension 05/22/2011    Past Surgical History:  Procedure Laterality Date   CHOLECYSTECTOMY     REVERSE SHOULDER ARTHROPLASTY Left 05/23/2014   Procedure: REVERSE SHOULDER ARTHROPLASTY;  Surgeon: Mable Paris, MD;  Location: [facility] OR;  Service: Orthopedics;  Laterality: Left;   REVERSE SHOULDER ARTHROPLASTY Right 12/11/2015   Procedure: REVERSE SHOULDER ARTHROPLASTY;  Surgeon: Jones Broom, MD;  Location: [facility] OR;  Service: Orthopedics;  Laterality: Right;  Right reverse total shoulder arthroplasty   STERIOD INJECTION Left 05/23/2014   Procedure: STEROID INJECTION THUMB;  Surgeon: Mable Paris, MD;  Location: [facility] OR;  Service: Orthopedics;  Laterality: Left;  Steroid injection left thumb     OB History   No obstetric history on file.     Family History  Problem Relation Age of Onset   Heart attack Mother        Died of MI, dec at 46    Cirrhosis Father        alcohol induced     Social History   Tobacco Use   Smoking status: Former    Packs/day: 1.00    Years: 20.00    Pack years: 20.00    Types: Cigarettes  Quit date: 04/12/1968    Years since quitting: 52.9   Smokeless tobacco: Never  Substance Use Topics   Alcohol use: No    Alcohol/week: 0.0 standard drinks   Drug use: No    Home Medications Prior to Admission medications   Medication Sig Start Date End Date Taking? Authorizing Provider  atorvastatin (LIPITOR) 20 MG tablet Take 1 tablet (20 mg total) by mouth daily. 10/16/20   Patwardhan, Reynold Bowen, MD  cholecalciferol (VITAMIN D3) 25 MCG (1000 UNIT) tablet Take 1,000 Units by mouth daily.    [provider]  isosorbide-hydrALAZINE (BIDIL) 20-37.5 MG tablet TAKE 1  TABLET BY MOUTH THREE TIMES DAILY 02/13/21   Patwardhan, Reynold Bowen, MD  metFORMIN (GLUCOPHAGE) 500 MG tablet Take 1 tablet (500 mg total) by mouth 2 (two) times daily with a meal. Resume metformin on 02/09/2020 02/07/20   Cristal Ford, DO  metoprolol tartrate (LOPRESSOR) 50 MG tablet Take 1.5 tablets (75 mg total) by mouth 2 (two) times daily. 10/16/20   Patwardhan, Reynold Bowen, MD  spironolactone (ALDACTONE) 25 MG tablet Take 1 tablet (25 mg total) by mouth every morning. 10/16/20   Patwardhan, Reynold Bowen, MD    Allergies    Patient has no known allergies.  Review of Systems   Review of Systems  Constitutional:  Negative for fever.  HENT:  Negative for nosebleeds.   Eyes:  Negative for redness.  Respiratory:  Negative for cough and shortness of breath.   Cardiovascular:  Negative for chest pain.  Gastrointestinal:  Negative for abdominal pain, diarrhea and vomiting.  Genitourinary:  Positive for flank pain. Negative for dysuria and hematuria.  Musculoskeletal:  Negative for neck pain.       Right mid back pain laterally/right flank area pain  Skin:  Negative for rash.  Neurological:  Negative for headaches.  Hematological:  Does not bruise/bleed easily.  Psychiatric/Behavioral:  Negative for confusion.    Physical Exam Updated Vital Signs BP (!) 183/84   Pulse 71   Temp 97.9 F (36.6 C) (Oral)   Resp 19   Ht 1.626 m (5\' 4" )   Wt 63.5 kg   SpO2 99%   BMI 24.03 kg/m   Physical Exam Vitals and nursing note reviewed.  Constitutional:      Appearance: Normal appearance. She is well-developed.  HENT:     Head: Atraumatic.     Comments: No facial or scalp sts or bruising/contusion.     Nose: Nose normal.     Mouth/Throat:     Mouth: Mucous membranes are moist.  Eyes:     General: No scleral icterus.    Conjunctiva/sclera: Conjunctivae normal.     Pupils: Pupils are equal, round, and reactive to light.  Neck:     Trachea: No tracheal deviation.  Cardiovascular:     Rate and  Rhythm: Normal rate and regular rhythm.     Pulses: Normal pulses.     Heart sounds: Normal heart sounds. No murmur heard.   No friction rub. No gallop.  Pulmonary:     Effort: Pulmonary effort is normal. No respiratory distress.     Breath sounds: Normal breath sounds.     Comments: Normal chest movement.  Abdominal:     General: Bowel sounds are normal. There is no distension.     Palpations: Abdomen is soft.     Tenderness: There is abdominal tenderness. There is no guarding.     Comments: Tenderness right flank pain/tenderness laterally/posterior, and over right posterior/lateral costal  margin area. No sts or skin changes in area of pain. No rash.   Genitourinary:    Comments: No cva tenderness.  Musculoskeletal:        General: No swelling.     Cervical back: Normal range of motion and neck supple. No rigidity. No muscular tenderness.     Comments: CTLS spine, non tender, aligned, no step off.   Skin:    General: Skin is warm and dry.     Findings: No rash.  Neurological:     Mental Status: She is alert.     Comments: Alert, speech normal. Motor/sens grossly intact bil. Ambulatory.   Psychiatric:        Mood and Affect: Mood normal.    ED Results / Procedures / Treatments   Labs (all labs ordered are listed, but only abnormal results are displayed) Results for orders placed or performed during the hospital encounter of 03/02/21  Comprehensive metabolic panel  Result Value Ref Range   Sodium 139 135 - 145 mmol/L   Potassium 4.2 3.5 - 5.1 mmol/L   Chloride 107 98 - 111 mmol/L   CO2 22 22 - 32 mmol/L   Glucose, Bld 125 (H) 70 - 99 mg/dL   BUN 19 8 - 23 mg/dL   Creatinine, Ser 1.49 (H) 0.44 - 1.00 mg/dL   Calcium 9.6 8.9 - 10.3 mg/dL   Total Protein 7.5 6.5 - 8.1 g/dL   Albumin 3.8 3.5 - 5.0 g/dL   AST 27 15 - 41 U/L   ALT 19 0 - 44 U/L   Alkaline Phosphatase 88 38 - 126 U/L   Total Bilirubin 0.8 0.3 - 1.2 mg/dL   GFR, Estimated 33 (L) >60 mL/min   Anion gap 10 5 -  15  CBC with Differential  Result Value Ref Range   WBC 9.9 4.0 - 10.5 K/uL   RBC 4.26 3.87 - 5.11 MIL/uL   Hemoglobin 11.2 (L) 12.0 - 15.0 g/dL   HCT 34.2 (L) 36.0 - 46.0 %   MCV 80.3 80.0 - 100.0 fL   MCH 26.3 26.0 - 34.0 pg   MCHC 32.7 30.0 - 36.0 g/dL   RDW 15.3 11.5 - 15.5 %   Platelets 296 150 - 400 K/uL   nRBC 0.0 0.0 - 0.2 %   Neutrophils Relative % 24 %   Neutro Abs 2.4 1.7 - 7.7 K/uL   Lymphocytes Relative 66 %   Lymphs Abs 6.6 (H) 0.7 - 4.0 K/uL   Monocytes Relative 7 %   Monocytes Absolute 0.7 0.1 - 1.0 K/uL   Eosinophils Relative 2 %   Eosinophils Absolute 0.2 0.0 - 0.5 K/uL   Basophils Relative 1 %   Basophils Absolute 0.1 0.0 - 0.1 K/uL   Immature Granulocytes 0 %   Abs Immature Granulocytes 0.02 0.00 - 0.07 K/uL   CT Abdomen Pelvis W Contrast  Result Date: 03/03/2021 CLINICAL DATA:  Low back pain. EXAM: CT ABDOMEN AND PELVIS WITH CONTRAST TECHNIQUE: Multidetector CT imaging of the abdomen and pelvis was performed using the standard protocol following bolus administration of intravenous contrast. CONTRAST:  34mL OMNIPAQUE IOHEXOL 300 MG/ML  SOLN COMPARISON:  CT abdomen pelvis dated 02/08/2020. FINDINGS: Lower chest: Bibasilar interstitial coarsening and fibrosis. There is coronary vascular calcification. No intra-abdominal free air or free fluid. Hepatobiliary: The liver is unremarkable.  Cholecystectomy. Pancreas: Unremarkable. No pancreatic ductal dilatation or surrounding inflammatory changes. Spleen: Normal in size without focal abnormality. Adrenals/Urinary Tract: The adrenal glands unremarkable. There  is no hydronephrosis on either side. There is symmetric enhancement and excretion of contrast by both kidneys. The visualized ureters and urinary bladder appear unremarkable. Stomach/Bowel: Severe sigmoid diverticulosis without active inflammatory changes. There is no bowel obstruction or active inflammation. The appendix is normal. Vascular/Lymphatic: Advanced  aortoiliac atherosclerotic disease. The IVC is unremarkable. No portal venous gas. There is no adenopathy. Reproductive: The uterus is grossly unremarkable. No adnexal masses. Other: None Musculoskeletal: Osteopenia with degenerative changes of the spine. No acute osseous pathology. IMPRESSION: 1. No acute intra-abdominal or pelvic pathology. 2. Severe sigmoid diverticulosis. No bowel obstruction. Normal appendix. 3. Aortic Atherosclerosis (ICD10-I70.0). Electronically Signed   By: Anner Crete M.D.   On: 03/03/2021 02:54   OCT, Retina - OU - Both Eyes  Result Date: 02/18/2021 Right Eye Quality was good. Scan locations included subfoveal. Central Foveal Thickness: 235. Progression has been stable. Findings include normal foveal contour. Left Eye Scan locations included subfoveal. Central Foveal Thickness: 245. Progression has been stable. Findings include normal foveal contour. Notes OD with clear media.  No active maculopathy Clear vitreous OS, no vitreous debris normal macula   EKG None  Radiology CT Abdomen Pelvis W Contrast  Result Date: 03/03/2021 CLINICAL DATA:  Low back pain. EXAM: CT ABDOMEN AND PELVIS WITH CONTRAST TECHNIQUE: Multidetector CT imaging of the abdomen and pelvis was performed using the standard protocol following bolus administration of intravenous contrast. CONTRAST:  46mL OMNIPAQUE IOHEXOL 300 MG/ML  SOLN COMPARISON:  CT abdomen pelvis dated 02/08/2020. FINDINGS: Lower chest: Bibasilar interstitial coarsening and fibrosis. There is coronary vascular calcification. No intra-abdominal free air or free fluid. Hepatobiliary: The liver is unremarkable.  Cholecystectomy. Pancreas: Unremarkable. No pancreatic ductal dilatation or surrounding inflammatory changes. Spleen: Normal in size without focal abnormality. Adrenals/Urinary Tract: The adrenal glands unremarkable. There is no hydronephrosis on either side. There is symmetric enhancement and excretion of contrast by both  kidneys. The visualized ureters and urinary bladder appear unremarkable. Stomach/Bowel: Severe sigmoid diverticulosis without active inflammatory changes. There is no bowel obstruction or active inflammation. The appendix is normal. Vascular/Lymphatic: Advanced aortoiliac atherosclerotic disease. The IVC is unremarkable. No portal venous gas. There is no adenopathy. Reproductive: The uterus is grossly unremarkable. No adnexal masses. Other: None Musculoskeletal: Osteopenia with degenerative changes of the spine. No acute osseous pathology. IMPRESSION: 1. No acute intra-abdominal or pelvic pathology. 2. Severe sigmoid diverticulosis. No bowel obstruction. Normal appendix. 3. Aortic Atherosclerosis (ICD10-I70.0). Electronically Signed   By: Anner Crete M.D.   On: 03/03/2021 02:54    Procedures Procedures   Medications Ordered in ED Medications  acetaminophen (TYLENOL) tablet 1,000 mg (has no administration in time range)  methocarbamol (ROBAXIN) tablet 500 mg (has no administration in time range)  oxyCODONE-acetaminophen (PERCOCET/ROXICET) 5-325 MG per tablet 1 tablet (1 tablet Oral Given 03/02/21 2349)  iohexol (OMNIPAQUE) 300 MG/ML solution 80 mL (80 mLs Intravenous Contrast Given 03/03/21 0244)    ED Course  I have reviewed the triage vital signs and the nursing notes.  Pertinent labs & imaging results that were available during my care of the patient were reviewed by me and considered in my medical decision making (see chart for details).    MDM Rules/Calculators/A&P                          Labs and imaging ordered from triage.   Reviewed nursing notes and prior charts for additional history.   Labs reviewed/interpreted by me - wbc normal.  CT  reviewed/interpreted by me - no acute intraabd trauma.   Acetaminophen po. Robaxin po.   Patient currently appears stable for d/c.   Return precautions provided.    Final Clinical Impression(s) / ED Diagnoses Final diagnoses:   None    Rx / DC Orders ED Discharge Orders     None        Lajean Saver, MD 03/03/21 [phone]

## 2021-05-21 ENCOUNTER — Encounter (INDEPENDENT_AMBULATORY_CARE_PROVIDER_SITE_OTHER): Payer: Medicare Other | Admitting: Ophthalmology

## 2021-10-16 ENCOUNTER — Ambulatory Visit: Payer: Medicare Other | Admitting: Cardiology

## 2021-10-16 NOTE — Progress Notes (Signed)
Error

## 2022-01-25 ENCOUNTER — Other Ambulatory Visit: Payer: Self-pay | Admitting: Cardiology

## 2022-01-25 DIAGNOSIS — I471 Supraventricular tachycardia, unspecified: Secondary | ICD-10-CM

## 2022-01-25 DIAGNOSIS — I10 Essential (primary) hypertension: Secondary | ICD-10-CM

## 2022-03-04 ENCOUNTER — Other Ambulatory Visit: Payer: Self-pay | Admitting: Cardiology

## 2022-03-04 DIAGNOSIS — I471 Supraventricular tachycardia, unspecified: Secondary | ICD-10-CM

## 2022-03-04 DIAGNOSIS — I10 Essential (primary) hypertension: Secondary | ICD-10-CM

## 2022-04-22 ENCOUNTER — Other Ambulatory Visit: Payer: Self-pay | Admitting: Cardiology

## 2022-04-22 DIAGNOSIS — I10 Essential (primary) hypertension: Secondary | ICD-10-CM

## 2022-06-07 ENCOUNTER — Other Ambulatory Visit: Payer: Self-pay | Admitting: Cardiology

## 2022-06-07 DIAGNOSIS — I471 Supraventricular tachycardia, unspecified: Secondary | ICD-10-CM

## 2022-06-07 DIAGNOSIS — I10 Essential (primary) hypertension: Secondary | ICD-10-CM

## 2022-07-18 ENCOUNTER — Other Ambulatory Visit: Payer: Self-pay | Admitting: Cardiology

## 2022-07-18 DIAGNOSIS — I10 Essential (primary) hypertension: Secondary | ICD-10-CM

## 2022-07-18 DIAGNOSIS — I471 Supraventricular tachycardia, unspecified: Secondary | ICD-10-CM

## 2022-08-28 ENCOUNTER — Other Ambulatory Visit: Payer: Self-pay | Admitting: Cardiology

## 2022-08-28 DIAGNOSIS — I10 Essential (primary) hypertension: Secondary | ICD-10-CM

## 2022-08-28 DIAGNOSIS — I471 Supraventricular tachycardia, unspecified: Secondary | ICD-10-CM

## 2022-10-12 ENCOUNTER — Other Ambulatory Visit: Payer: Self-pay | Admitting: Cardiology

## 2022-10-12 DIAGNOSIS — I10 Essential (primary) hypertension: Secondary | ICD-10-CM

## 2022-10-14 ENCOUNTER — Other Ambulatory Visit: Payer: Self-pay | Admitting: Cardiology

## 2022-10-14 DIAGNOSIS — I10 Essential (primary) hypertension: Secondary | ICD-10-CM

## 2022-10-14 DIAGNOSIS — I471 Supraventricular tachycardia, unspecified: Secondary | ICD-10-CM

## 2022-11-24 NOTE — Telephone Encounter (Signed)
Done

## 2023-09-22 ENCOUNTER — Other Ambulatory Visit: Payer: Self-pay | Admitting: Cardiology

## 2023-09-22 DIAGNOSIS — I10 Essential (primary) hypertension: Secondary | ICD-10-CM

## 2023-09-22 DIAGNOSIS — I471 Supraventricular tachycardia, unspecified: Secondary | ICD-10-CM

## 2023-09-23 NOTE — Telephone Encounter (Signed)
 Pt's last true OV with Dr. Filiberto Hug was 10/16/20. Please advise.

## 2023-09-23 NOTE — Telephone Encounter (Signed)
 Defer to PCP if they are seeing her regularly.  Thanks MJP

## 2023-09-23 NOTE — Telephone Encounter (Signed)
Attempted to call pt, but unable to leave message.

## 2023-09-23 NOTE — Telephone Encounter (Signed)
 Please review
